# Patient Record
Sex: Female | Born: 2000 | Race: Black or African American | Hispanic: No | Marital: Single | Smoking: Never smoker
Health system: Southern US, Community
[De-identification: ages and names within clinical notes are randomized; demographics above are authoritative.]

## PROBLEM LIST (undated history)

## (undated) DIAGNOSIS — F419 Anxiety disorder, unspecified: Secondary | ICD-10-CM

## (undated) DIAGNOSIS — R569 Unspecified convulsions: Secondary | ICD-10-CM

## (undated) DIAGNOSIS — K219 Gastro-esophageal reflux disease without esophagitis: Secondary | ICD-10-CM

---
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
## 2018-01-04 ENCOUNTER — Emergency Department (HOSPITAL_COMMUNITY)
Admission: EM | Admit: 2018-01-04 | Discharge: 2018-01-04 | Disposition: A | Discharge status: Home / Self Care | Payer: Medicaid Other | Attending: Emergency Medicine | Admitting: Emergency Medicine

## 2018-01-04 ENCOUNTER — Emergency Department (HOSPITAL_COMMUNITY): Payer: Medicaid Other

## 2018-01-04 ENCOUNTER — Encounter (HOSPITAL_COMMUNITY): Payer: Self-pay | Admitting: Emergency Medicine

## 2018-01-04 DIAGNOSIS — S39012A Strain of muscle, fascia and tendon of lower back, initial encounter: Secondary | ICD-10-CM | POA: Insufficient documentation

## 2018-01-04 DIAGNOSIS — Z79899 Other long term (current) drug therapy: Secondary | ICD-10-CM | POA: Insufficient documentation

## 2018-01-04 DIAGNOSIS — R569 Unspecified convulsions: Secondary | ICD-10-CM | POA: Insufficient documentation

## 2018-01-04 DIAGNOSIS — Z7722 Contact with and (suspected) exposure to environmental tobacco smoke (acute) (chronic): Secondary | ICD-10-CM | POA: Insufficient documentation

## 2018-01-04 DIAGNOSIS — M549 Dorsalgia, unspecified: Secondary | ICD-10-CM

## 2018-01-04 DIAGNOSIS — Y999 Unspecified external cause status: Secondary | ICD-10-CM | POA: Diagnosis not present

## 2018-01-04 DIAGNOSIS — S3992XA Unspecified injury of lower back, initial encounter: Secondary | ICD-10-CM | POA: Diagnosis present

## 2018-01-04 DIAGNOSIS — Y929 Unspecified place or not applicable: Secondary | ICD-10-CM | POA: Diagnosis not present

## 2018-01-04 DIAGNOSIS — Y939 Activity, unspecified: Secondary | ICD-10-CM | POA: Insufficient documentation

## 2018-01-04 DIAGNOSIS — X509XXA Other and unspecified overexertion or strenuous movements or postures, initial encounter: Secondary | ICD-10-CM | POA: Insufficient documentation

## 2018-01-04 LAB — I-STAT CHEM 8, ED
BUN: 8 mg/dL (ref 6–20)
CALCIUM ION: 1.21 mmol/L (ref 1.15–1.40)
Chloride: 104 mmol/L (ref 101–111)
Creatinine, Ser: 0.6 mg/dL (ref 0.44–1.00)
Glucose, Bld: 93 mg/dL (ref 65–99)
HCT: 42 % (ref 36.0–46.0)
Hemoglobin: 14.3 g/dL (ref 12.0–15.0)
Potassium: 3.4 mmol/L — ABNORMAL LOW (ref 3.5–5.1)
SODIUM: 140 mmol/L (ref 135–145)
TCO2: 24 mmol/L (ref 22–32)

## 2018-01-04 LAB — I-STAT BETA HCG BLOOD, ED (MC, WL, AP ONLY)

## 2018-01-04 MED ORDER — SODIUM CHLORIDE 0.9 % IV SOLN
500.0000 mg | Freq: Once | INTRAVENOUS | Status: AC
  Administered 2018-01-04: 500 mg via INTRAVENOUS
  Filled 2018-01-04: qty 5

## 2018-01-04 MED ORDER — ACETAMINOPHEN 325 MG PO TABS
650.0000 mg | ORAL_TABLET | Freq: Once | ORAL | Status: AC
  Administered 2018-01-04: 650 mg via ORAL
  Filled 2018-01-04: qty 2

## 2018-01-04 MED ORDER — IBUPROFEN 200 MG PO TABS
400.0000 mg | ORAL_TABLET | Freq: Once | ORAL | Status: AC
  Administered 2018-01-04: 400 mg via ORAL
  Filled 2018-01-04: qty 2

## 2018-01-04 NOTE — ED Notes (Signed)
Transported to radiology for x ray

## 2018-01-04 NOTE — ED Notes (Signed)
Urine sample in specimen cup at the bedside.

## 2018-01-04 NOTE — ED Triage Notes (Signed)
Per [facility] pt from school after having witnessed seizure. Bystanders reports upper body tremors lasting about 3 minutes. Reports patient fell back onto ground and c/o neck and lower back pain. Reports taking Lamictal for seizures. States appt with neuro this week due to increase in seizures lately.

## 2018-01-04 NOTE — ED Notes (Signed)
Bed: WA20 Expected date:  Expected time:  Means of arrival:  Comments: EMS-fall 

## 2018-01-04 NOTE — ED Notes (Signed)
Bed: WA07 Expected date:  Expected time:  Means of arrival:  Comments: EMS-SZ 

## 2018-01-04 NOTE — ED Provider Notes (Signed)
[facility] Note   CSN: [id number] Arrival date & time: 01/04/18  1105     History   Chief Complaint Chief Complaint  Patient presents with  . Seizures    HPI Melinda Calderon is a 17 y.o. female.  The history is provided by the patient and the EMS personnel.  Seizures   This is a chronic problem. The current episode started less than 1 hour ago. The problem has been resolved. There was 1 seizure. The most recent episode lasted 2 to 5 minutes. Associated symptoms include headaches and neck stiffness. Pertinent negatives include no sleepiness, no confusion, no cough, no nausea and no diarrhea. Characteristics include rhythmic jerking and loss of consciousness. The episode was witnessed. There was the sensation of an aura present (states she wasn't feeling right). The seizures did not continue in the ED. The seizure(s) had no focality. Possible causes include sleep deprivation. Possible causes do not include medication or dosage change, missed seizure meds, recent illness or change in alcohol use. saw neuro in Nov and at that time meds increased due to frequent sz There has been no fever. Associated symptoms comments: Head, neck and lower back pain since seizure.  Per bystander report patient was sitting at her desk and fell backwards directly onto the floor during a seizure episode. There were no medications administered prior to arrival.    Past Medical History:  Diagnosis Date  . Anxiety   . Seizures [facility])     Patient Active Problem List   Diagnosis Date Noted  . 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 Panic disorder 10/23/2015  . Difficulty controlling anger 09/13/2015  . Dysphonia 09/12/2015  . Episodic tension-type headache, not intractable 02/15/2015  . Syncope and collapse 02/15/2015  . Migraine without aura 02/15/2015  . Lack of adequate sleep 02/15/2015  . Generalized convulsive  epilepsy (HCC) 02/22/2014  . Encounter for long-term (current) use of other medications 02/22/2014  . Seizure disorder (HCC) 12/27/2011  . Seizure (HCC) 12/27/2011  . Headache(784.0) 12/27/2011    History reviewed. No pertinent surgical history.  OB History    No data available       Home Medications    Prior to Admission medications   Medication Sig Start Date End Date Taking? Authorizing Provider  lamoTRIgine (LAMICTAL) 100 MG tablet Take 2 tablets by mouth twice per day 10/08/17   Elveria Rising, NP  levETIRAcetam (KEPPRA) 500 MG tablet Take 2 tablets twice per day 10/08/17   Elveria Rising, NP  PARoxetine (PAXIL) 30 MG tablet Take 1 tablet at bedtime 11/16/17   Elveria Rising, NP    Family History Family History  Problem Relation Age of Onset  . Hypertension Mother   . Cancer Maternal Grandmother        Died at 10  . Cancer Paternal Grandmother        Died at 2  . Epilepsy Maternal Grandfather   . Cirrhosis Maternal Grandfather        Died at 20  . Seizures Paternal Grandfather        Died at 75  . Epilepsy Paternal Aunt     Social History Social History   Tobacco Use  . Smoking status: Passive Smoke Exposure - Never Smoker  . Smokeless tobacco: Never Used  Substance Use Topics  . Alcohol use: No    Alcohol/week: 0.0 oz  . Drug use: No     Allergies   Patient  has no known allergies.   Review of Systems Review of Systems  Respiratory: Negative for cough.   Gastrointestinal: Negative for diarrhea and nausea.  Neurological: Positive for seizures, loss of consciousness and headaches.  Psychiatric/Behavioral: Negative for confusion.  All other systems reviewed and are negative.    Physical Exam Updated Vital Signs BP (!) 126/102 (BP Location: Left Arm)   Pulse 78   Temp 97.8 F (36.6 C) (Oral)   Resp 18   SpO2 100%   Physical Exam  Constitutional: She is oriented to person, place, and time. She appears well-developed and  well-nourished. No distress.  HENT:  Head: Normocephalic and atraumatic.  Mouth/Throat: Oropharynx is clear and moist.  Eyes: Conjunctivae and EOM are normal. Pupils are equal, round, and reactive to light.  Neck: Neck supple. Spinous process tenderness and muscular tenderness present. Decreased range of motion present.  c-collar in place  Cardiovascular: Normal rate, regular rhythm and intact distal pulses.  No murmur heard. Pulmonary/Chest: Effort normal and breath sounds normal. No respiratory distress. She has no wheezes. She has no rales.  Abdominal: Soft. She exhibits no distension. There is no tenderness. There is no rebound and no guarding.  Musculoskeletal: She exhibits tenderness. She exhibits no edema.       Thoracic back: She exhibits tenderness, bony tenderness and spasm.       Back:  Bilateral parathoracic muscular tenderness  Neurological: She is alert and oriented to person, place, and time. She has normal strength. No cranial nerve deficit or sensory deficit. Coordination normal.  Skin: Skin is warm and dry. No rash noted. No erythema.  Psychiatric: She has a normal mood and affect. Her behavior is normal.  Nursing note and vitals reviewed.    ED Treatments / Results  Labs (all labs ordered are listed, but only abnormal results are displayed) Labs Reviewed  I-STAT CHEM 8, ED - Abnormal; Notable for the following components:      Result Value   Potassium 3.4 (*)    All other components within normal limits  I-STAT BETA HCG BLOOD, ED ([facility], [facility], [facility] ONLY)    EKG  EKG Interpretation None       Radiology Dg Thoracic Spine W/swimmers  Result Date: 01/04/2018 CLINICAL DATA:  Back pain EXAM: THORACIC SPINE - 3 VIEWS COMPARISON:  08/28/2016 FINDINGS: There is no evidence of thoracic spine fracture. Alignment is normal. No other significant bone abnormalities are identified. IMPRESSION: Negative. Electronically Signed   By: Charlett Nose M.D.   On: 01/04/2018 13:02    Ct Head Wo Contrast  Result Date: 01/04/2018 CLINICAL DATA:  Seizure, fall.  Neck pain. EXAM: CT HEAD WITHOUT CONTRAST CT CERVICAL SPINE WITHOUT CONTRAST TECHNIQUE: Multidetector CT imaging of the head and cervical spine was performed following the standard protocol without intravenous contrast. Multiplanar CT image reconstructions of the cervical spine were also generated. COMPARISON:  07/07/2011 FINDINGS: CT HEAD FINDINGS Brain: No acute intracranial abnormality. Specifically, no hemorrhage, hydrocephalus, mass lesion, acute infarction, or significant intracranial injury. Vascular: No hyperdense vessel or unexpected calcification. Skull: No acute calvarial abnormality. Sinuses/Orbits: Visualized paranasal sinuses and mastoids clear. Orbital soft tissues unremarkable. Other: None CT CERVICAL SPINE FINDINGS Alignment: No subluxation.  Loss of cervical lordosis. Skull base and vertebrae: No fracture Soft tissues and spinal canal: Prevertebral soft tissues are normal. No epidural or paraspinal hematoma. Disc levels:  Maintains Upper chest: Negative Other: None IMPRESSION: No intracranial abnormality. Loss of cervical lordosis which may be positional or related to muscle spasm.  No bony abnormality. Electronically Signed   By: Charlett NoseKevin  Dover M.D.   On: 01/04/2018 12:17   Ct Cervical Spine Wo Contrast  Result Date: 01/04/2018 CLINICAL DATA:  Seizure, fall.  Neck pain. EXAM: CT HEAD WITHOUT CONTRAST CT CERVICAL SPINE WITHOUT CONTRAST TECHNIQUE: Multidetector CT imaging of the head and cervical spine was performed following the standard protocol without intravenous contrast. Multiplanar CT image reconstructions of the cervical spine were also generated. COMPARISON:  07/07/2011 FINDINGS: CT HEAD FINDINGS Brain: No acute intracranial abnormality. Specifically, no hemorrhage, hydrocephalus, mass lesion, acute infarction, or significant intracranial injury. Vascular: No hyperdense vessel or unexpected calcification.  Skull: No acute calvarial abnormality. Sinuses/Orbits: Visualized paranasal sinuses and mastoids clear. Orbital soft tissues unremarkable. Other: None CT CERVICAL SPINE FINDINGS Alignment: No subluxation.  Loss of cervical lordosis. Skull base and vertebrae: No fracture Soft tissues and spinal canal: Prevertebral soft tissues are normal. No epidural or paraspinal hematoma. Disc levels:  Maintains Upper chest: Negative Other: None IMPRESSION: No intracranial abnormality. Loss of cervical lordosis which may be positional or related to muscle spasm. No bony abnormality. Electronically Signed   By: Charlett NoseKevin  Dover M.D.   On: 01/04/2018 12:17    Procedures Procedures (including critical care time)  Medications Ordered in ED Medications  acetaminophen (TYLENOL) tablet 650 mg (not administered)  ibuprofen (ADVIL,MOTRIN) tablet 400 mg (not administered)  levETIRAcetam (KEPPRA) 500 mg in sodium chloride 0.9 % 100 mL IVPB (not administered)     Initial Impression / Assessment and Plan / ED Course  I have reviewed the triage vital signs and the nursing notes.  Pertinent labs & imaging results that were available during my care of the patient were reviewed by me and considered in my medical decision making (see chart for details).     Patient is a 17 year old female with a history of seizures on Lamictal and Keppra presenting today after a witnessed seizure while she was at class.  Patient has frequent seizures and states because of more frequent seizures she has an appointment to see [facility] later this week.  She has been taking her medications as prescribed.  She last saw neurology in November and at that time her Lamictal and Keppra were doubled.  She is now taking 500 mg of Keppra twice daily and 100 mg of Lamictal twice daily.  Patient denies any recent illness, concern for pregnancy, change in medications or alcohol use.  She states she chronically gets minimal sleep but that is not a new  symptom. Currently on exam she is neurovascularly intact.  However she is complaining of headache, neck pain and upper back pain.  She did fall out of a chair smacking her head on the floor.  We will do imaging to ensure no other acute injury.  Chem-8 and hCG pending.  Patient given IV Keppra bolus.  1:39 PM Labs are reassuring and imaging is negative.  C-spine was cleared.  Patient felt better after Tylenol.  Discussed findings with patient and her family.  She has an appointment later this week with neurology.  Told her to continue her current meds until seeing neurology where they will most likely be adjusted. Final Clinical Impressions(s) / ED Diagnoses   Final diagnoses:  Back pain  Seizures (HCC)  Back strain, initial encounter    ED Discharge Orders    None       Gwyneth SproutPlunkett, Delta Pichon, MD 01/04/18 1341

## 2018-01-13 ENCOUNTER — Encounter (INDEPENDENT_AMBULATORY_CARE_PROVIDER_SITE_OTHER): Payer: Self-pay | Admitting: Family

## 2018-01-13 ENCOUNTER — Ambulatory Visit (INDEPENDENT_AMBULATORY_CARE_PROVIDER_SITE_OTHER): Payer: Medicaid Other | Admitting: Family

## 2018-01-13 VITALS — BP 120/80 | HR 68 | Ht 60.75 in | Wt 143.8 lb

## 2018-01-13 DIAGNOSIS — F445 Conversion disorder with seizures or convulsions: Secondary | ICD-10-CM

## 2018-01-13 DIAGNOSIS — G40309 Generalized idiopathic epilepsy and epileptic syndromes, not intractable, without status epilepticus: Secondary | ICD-10-CM

## 2018-01-13 DIAGNOSIS — F410 Panic disorder [episodic paroxysmal anxiety] without agoraphobia: Secondary | ICD-10-CM

## 2018-01-13 DIAGNOSIS — Z72820 Sleep deprivation: Secondary | ICD-10-CM

## 2018-01-13 MED ORDER — FYCOMPA 2 MG PO TABS: ORAL_TABLET | ORAL | 0 refills | Status: DC

## 2018-01-13 NOTE — Patient Instructions (Signed)
Thank you for coming in today.   Instructions for you until your next appointment are as follows: 1. We will start a new medication called Fycompa. To take it, take 1 tablet at bedtime.  2. Call me in 2 weeks to let me know how you are doing. You can also send a text me at [phone] and I will call you back.  3. Continue your other medications as you have been taking them.  4. I am very happy that you are being seen at [facility]. Keep going there for help with panic.  5. I have written a letter for you to return to school.  6. Please return for follow up in 4 weeks.

## 2018-01-13 NOTE — Progress Notes (Signed)
Patient: Melinda Calderon MRN: [id number] Sex: female DOB: 04/21/2001  Provider: Elveria Rising, NP Location of Care: [facility]  Note type: Routine return visit  History of Present Illness: Referral Source: Ivory Broad, MD History from: mother, patient and [facility] chart Chief Complaint: Epilepsy/Panic disorder  Melinda Calderon is a 17 y.o. with history of generalized convulsive seizures, non-epileptic events, and panic disorder. She was last seen October 08, 2017. 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 Some last 1-3 minutes while some have lasted 15 min or more. She has also experienced episodes of loss of awareness that were non-epileptic in nature. Melinda Calderon had a 24 hr ambulatory EEG in February 2017 that was normal. She is taking and tolerating Lamotrigine and Levetiracetam for her seizure disorder and says that she has not missed any doses. She and her mother report today that she continues to have frequent seizures. Some occur during the day, such as last week at school when she had a seizure, fell backwards and struck her head. Mom says that Melinda Calderon also has frequent nocturnal seizures that typically occur within 1 hour of going to sleep but can also occur later in the night.   Melinda Calderon has a significant panic disorder but says that has improved considerably. She is being seen by a therapist and is also taking Paxil. Mom said that she has an appointment with a psychiatrist next month. Melinda Calderon says that she has trouble falling asleep and then is awakened by her family caring for her when she has a seizure. She reports frequent nights with disrupted or poor sleep.  Melinda Calderon says that school was going well until her seizure last week. She said that she has not been allowed to return to school until she was cleared from this office.   Melinda Calderon has been otherwise generally healthy since she was last seen. Neither she  nor her mother have other health concerns for her today other than previously mentioned.  Review of Systems: Please see the HPI for neurologic and other pertinent review of systems. Otherwise, all other systems were reviewed and were negative.   Past Medical History:  Diagnosis Date  . Anxiety   . Seizures (HCC)    Hospitalizations: No., Head Injury: No., Nervous System Infections: No., Immunizations up to date: Yes.   Past Medical History Comments: There have been problems with multiple missed appointments, difficulties obtaining clear history and concerns about possible non-epileptic events.  Surgical History History reviewed. No pertinent surgical history.  Family History family history includes Cancer in her maternal grandmother and paternal grandmother; Cirrhosis in her maternal grandfather; Epilepsy in her maternal grandfather and paternal aunt; Hypertension in her mother; Seizures in her paternal grandfather. Family History is otherwise negative for migraines, seizures, cognitive impairment, blindness, deafness, birth defects, chromosomal disorder, autism.  Social History Social History   Socioeconomic History  . Marital status: Single    Spouse name: None  . Number of children: None  . Years of education: None  . Highest education level: None  Social Needs  . Financial resource strain: None  . Food insecurity - worry: None  . Food insecurity - inability: None  . Transportation needs - medical: None  . Transportation needs - non-medical: None  Occupational History  . None  Tobacco Use  . Smoking status: Passive Smoke Exposure - Never Smoker  . Smokeless tobacco: Never Used  Substance and Sexual Activity  . Alcohol use: No    Alcohol/week: 0.0 oz  .  Drug use: No  . Sexual activity: No  Other Topics Concern  . 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   She attends [employer].    She lives with her parents and sibling.    She enjoys  watching TV and being on her phone.    Allergies No Known Allergies  Physical Exam BP 120/80   Pulse 68   Ht 5' 0.75" (1.543 m)   Wt 143 lb 12.8 oz (65.2 kg)   LMP 12/30/2017   BMI 27.39 kg/m  General: Well developed, well nourished adolescent female, seated on exam table, in no evident distress, dyed red hair, brown eyes, right handed Head: Head normocephalic and atraumatic.  Oropharynx benign. Neck: Supple with no carotid bruits Cardiovascular: Regular rate and rhythm, no murmurs Respiratory: Breath sounds clear to auscultation Musculoskeletal: No obvious deformities or scoliosis Skin: No rashes or neurocutaneous lesions  Neurologic Exam Mental Status: Awake and fully alert.  Oriented to place and time.  Recent and remote memory intact.  Attention span, concentration, and fund of knowledge appropriate.  Mood and affect appropriate. Cranial Nerves: Fundoscopic exam reveals sharp disc margins.  Pupils equal, briskly reactive to light.  Extraocular movements full without nystagmus.  Visual fields full to confrontation.  Hearing intact and symmetric to finger rub.  Facial sensation intact.  Face tongue, palate move normally and symmetrically.  Neck flexion and extension normal. Motor: Normal bulk and tone. Normal strength in all tested extremity muscles. Sensory: Intact to touch and temperature in all extremities.  Coordination: Rapid alternating movements normal in all extremities.  Finger-to-nose and heel-to shin performed accurately bilaterally.  Romberg negative. Gait and Station: Arises from chair without difficulty.  Stance is normal. Gait demonstrates normal stride length and balance.   Able to heel, toe and tandem walk without difficulty. Reflexes: 1+ and symmetric. Toes downgoing.  Impression 1.  Generalized convulsive epilepsy 2.  Non-epileptic events 3.  Panic disorder 4. Sleep deprivation 5. Problems with medical non-compliance 6. [facility]  Recommendations for plan of care [facility] Faiza has neither had nor required imaging or lab studies since the last visit. She is a 17 year old girl with history of generalized convulsive seizures, non-epileptic events, panic disorder and sleep deprivation. She is taking and tolerating Lamotrigine and Levetiracetam and says that she has been compliant. Unfortunately she continues to have breakthrough convulsive seizures. I consulted with Dr Sharene SkeansHickling and we will start Melinda Calderon on Melinda Calderon 2mg  with a slow upward titration.I reviewed the medication with Melinda Calderon and her mother, including potential side effects. I asked her to call me in 2 weeks to let me know how she is doing. We will gradually increase the medication based on side effects and efficacy. It is my hope that her sleep will improve when her seizures improve.  I also gave her a note for her to return to school.   In addition, I commended Melinda Calderon for working on her panic disorder with behavioral health. She is doing very well in terms of panic at this time.   I asked Zarai to return for follow up in 4 weeks or sooner if needed. She and her mother agreed with the plans ,made today.  The medication list was reviewed and reconciled.  I reviewed changes that were made in the prescribed medications today.  A complete medication list was provided to the patient.  Allergies as of 01/13/2018   No Known Allergies     Medication List        Accurate as of 01/13/18 11:59 PM. Always use your most recent med list.          Melinda Calderon 2 MG Tabs Generic drug:  Perampanel Take 1 tablet at bedtime   lamoTRIgine 100 MG tablet Commonly known as:  LAMICTAL Take 2 tablets by mouth twice per day   levETIRAcetam 500 MG tablet Commonly known as:  KEPPRA Take 2 tablets twice per day   PARoxetine 30 MG tablet Commonly known as:  PAXIL Take 1 tablet at bedtime        Dr. Sharene Skeans was consulted regarding the patient.   Total time spent with the patient was 30 minutes, of which 50% or more was spent in counseling and coordination of care.   Elveria Rising NP-C

## 2018-01-15 ENCOUNTER — Encounter (INDEPENDENT_AMBULATORY_CARE_PROVIDER_SITE_OTHER): Payer: Self-pay | Admitting: Family

## 2018-02-10 ENCOUNTER — Ambulatory Visit (INDEPENDENT_AMBULATORY_CARE_PROVIDER_SITE_OTHER): Payer: Medicaid Other | Admitting: Family

## 2018-02-15 ENCOUNTER — Ambulatory Visit (INDEPENDENT_AMBULATORY_CARE_PROVIDER_SITE_OTHER): Payer: Medicaid Other | Admitting: Family

## 2018-02-21 ENCOUNTER — Other Ambulatory Visit (INDEPENDENT_AMBULATORY_CARE_PROVIDER_SITE_OTHER): Payer: Self-pay | Admitting: Family

## 2018-02-21 DIAGNOSIS — G40309 Generalized idiopathic epilepsy and epileptic syndromes, not intractable, without status epilepticus: Secondary | ICD-10-CM

## 2018-03-04 ENCOUNTER — Encounter (HOSPITAL_COMMUNITY): Payer: Self-pay

## 2018-03-04 ENCOUNTER — Other Ambulatory Visit: Payer: Self-pay

## 2018-03-04 ENCOUNTER — Emergency Department (HOSPITAL_COMMUNITY)
Admission: EM | Admit: 2018-03-04 | Discharge: 2018-03-04 | Disposition: A | Discharge status: Home / Self Care | Payer: Medicaid Other | Attending: Emergency Medicine | Admitting: Emergency Medicine

## 2018-03-04 DIAGNOSIS — Z79899 Other long term (current) drug therapy: Secondary | ICD-10-CM | POA: Diagnosis not present

## 2018-03-04 DIAGNOSIS — R112 Nausea with vomiting, unspecified: Secondary | ICD-10-CM | POA: Diagnosis not present

## 2018-03-04 DIAGNOSIS — R1084 Generalized abdominal pain: Secondary | ICD-10-CM | POA: Insufficient documentation

## 2018-03-04 DIAGNOSIS — Z7722 Contact with and (suspected) exposure to environmental tobacco smoke (acute) (chronic): Secondary | ICD-10-CM | POA: Insufficient documentation

## 2018-03-04 DIAGNOSIS — R197 Diarrhea, unspecified: Secondary | ICD-10-CM

## 2018-03-04 LAB — URINALYSIS, ROUTINE W REFLEX MICROSCOPIC
Bacteria, UA: NONE SEEN
Bilirubin Urine: NEGATIVE
GLUCOSE, UA: NEGATIVE mg/dL
Hgb urine dipstick: NEGATIVE
KETONES UR: 5 mg/dL — AB
Leukocytes, UA: NEGATIVE
Nitrite: NEGATIVE
Protein, ur: 30 mg/dL — AB
Specific Gravity, Urine: 1.021 (ref 1.005–1.030)
pH: 7 (ref 5.0–8.0)

## 2018-03-04 LAB — I-STAT CG4 LACTIC ACID, ED
LACTIC ACID, VENOUS: 2.62 mmol/L — AB (ref 0.5–1.9)
LACTIC ACID, VENOUS: 1.47 mmol/L (ref 0.5–1.9)

## 2018-03-04 LAB — COMPREHENSIVE METABOLIC PANEL
ALT: 13 U/L — ABNORMAL LOW (ref 14–54)
AST: 18 U/L (ref 15–41)
Albumin: 4.4 g/dL (ref 3.5–5.0)
Alkaline Phosphatase: 67 U/L (ref 38–126)
Anion gap: 9 (ref 5–15)
BUN: 10 mg/dL (ref 6–20)
CHLORIDE: 108 mmol/L (ref 101–111)
CO2: 22 mmol/L (ref 22–32)
Calcium: 9.3 mg/dL (ref 8.9–10.3)
Creatinine, Ser: 0.64 mg/dL (ref 0.44–1.00)
Glucose, Bld: 106 mg/dL — ABNORMAL HIGH (ref 65–99)
POTASSIUM: 3.7 mmol/L (ref 3.5–5.1)
Sodium: 139 mmol/L (ref 135–145)
TOTAL PROTEIN: 7.3 g/dL (ref 6.5–8.1)
Total Bilirubin: 0.7 mg/dL (ref 0.3–1.2)

## 2018-03-04 LAB — CBC WITH DIFFERENTIAL/PLATELET
BASOS ABS: 0 10*3/uL (ref 0.0–0.1)
Basophils Relative: 0 %
EOS ABS: 0.1 10*3/uL (ref 0.0–0.7)
Eosinophils Relative: 1 %
HCT: 39.7 % (ref 36.0–46.0)
HEMOGLOBIN: 13.5 g/dL (ref 12.0–15.0)
LYMPHS ABS: 3.2 10*3/uL (ref 0.7–4.0)
LYMPHS PCT: 43 %
MCH: 30 pg (ref 26.0–34.0)
MCHC: 34 g/dL (ref 30.0–36.0)
MCV: 88.2 fL (ref 78.0–100.0)
Monocytes Absolute: 0.5 10*3/uL (ref 0.1–1.0)
Monocytes Relative: 6 %
NEUTROS PCT: 50 %
Neutro Abs: 3.8 10*3/uL (ref 1.7–7.7)
Platelets: 289 10*3/uL (ref 150–400)
RBC: 4.5 MIL/uL (ref 3.87–5.11)
RDW: 13.5 % (ref 11.5–15.5)
WBC: 7.6 10*3/uL (ref 4.0–10.5)

## 2018-03-04 LAB — LIPASE, BLOOD: Lipase: 23 U/L (ref 11–51)

## 2018-03-04 LAB — I-STAT BETA HCG BLOOD, ED (MC, WL, AP ONLY): I-stat hCG, quantitative: <5 m[IU]/mL (ref <5)

## 2018-03-04 MED ORDER — DICYCLOMINE HCL 10 MG PO CAPS
10.0000 mg | ORAL_CAPSULE | Freq: Once | ORAL | Status: AC
  Administered 2018-03-04: 10 mg via ORAL
  Filled 2018-03-04: qty 1

## 2018-03-04 MED ORDER — SODIUM CHLORIDE 0.9 % IV BOLUS
1000.0000 mL | Freq: Once | INTRAVENOUS | Status: AC
Start: 2018-03-04 — End: 2018-03-04
  Administered 2018-03-04: 1000 mL via INTRAVENOUS

## 2018-03-04 MED ORDER — MORPHINE SULFATE (PF) 4 MG/ML IV SOLN
4.0000 mg | Freq: Once | INTRAVENOUS | Status: AC
  Administered 2018-03-04: 4 mg via INTRAVENOUS
  Filled 2018-03-04: qty 1

## 2018-03-04 MED ORDER — GI COCKTAIL ~~LOC~~
30.0000 mL | Freq: Once | ORAL | Status: AC
  Administered 2018-03-04: 30 mL via ORAL
  Filled 2018-03-04: qty 30

## 2018-03-04 MED ORDER — ONDANSETRON 4 MG PO TBDP: 4.0000 mg | ORAL_TABLET | Freq: Three times a day (TID) | ORAL | 0 refills | Status: DC | PRN

## 2018-03-04 MED ORDER — ONDANSETRON HCL 4 MG/2ML IJ SOLN
4.0000 mg | Freq: Once | INTRAMUSCULAR | Status: AC
  Administered 2018-03-04: 4 mg via INTRAVENOUS
  Filled 2018-03-04: qty 2

## 2018-03-04 NOTE — ED Notes (Addendum)
Pt was seen shaking and gurgling, with mother stating "she's having a seizure." Pt was turned to side, suction started.This lasted about 10 seconds. MD called to bedside. Pt then turned to side and started crying that she was having abdominal pain and crying. Pt is A/Ox4 at this time.

## 2018-03-04 NOTE — ED Notes (Signed)
Bed: WA24 Expected date:  Expected time:  Means of arrival:  Comments: [facility] 

## 2018-03-04 NOTE — ED Triage Notes (Signed)
Pt c/o N/V/D starting last night. Pt also had a witnessed seizure for approx 3 min, per pt's mother around 1630. Hx of seizures. Pt and mother report compliant with seizure medications. Pt A/Ox4 at this time. Pt c/o sharp intermittent generalized abd pain  starting last night.

## 2018-03-04 NOTE — ED Provider Notes (Signed)
[facility]-EMERGENCY DEPT Provider Note   CSN: [id number] Arrival date & time: 03/04/18  1654     History   Chief Complaint Chief Complaint  Patient presents with  . Emesis  . Dizziness  . Seizures    HPI Melinda Calderon is a 17 y.o. female.  17yo F w/ PMH including seizure disorder, migraines, anxiety/panic attacks who p/w vomiting and abdominal pain.  Patient reports that she has been having intermittent abdominal pain recently but last night it got much worse and it has been persistent and severe today.  She describes the pain as generalized and constant, worse when she tries to eat or drink anything.  She has had associated nausea and vomiting.  She took her medications today but states that she was not able to keep them down.  She reports some mild diarrhea today.  No sick contacts.  Mild cough but no cold symptoms.  No urinary symptoms, vaginal bleeding/discharge, alcohol use, or drug use.  The history is provided by the patient and a parent.  Emesis    Dizziness  Associated symptoms: vomiting   Seizures   Associated symptoms include vomiting.    Past Medical History:  Diagnosis Date  . Anxiety   . Seizures [facility](HCC)     Patient Active Problem List   Diagnosis Date Noted  . 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 Panic disorder 10/23/2015  . Difficulty controlling anger 09/13/2015  . Dysphonia 09/12/2015  . Episodic tension-type headache, not intractable 02/15/2015  . Syncope and collapse 02/15/2015  . Migraine without aura 02/15/2015  . Lack of adequate sleep 02/15/2015  . 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 Encounter for long-term (current) use of other medications 02/22/2014  . Seizure disorder (HCC) 12/27/2011  . Seizure (HCC) 12/27/2011  . Headache(784.0) 12/27/2011    History reviewed. No pertinent surgical history.   OB History   None      Home Medications     Prior to Admission medications   Medication Sig Start Date End Date Taking? Authorizing Provider  cetirizine (ZYRTEC) 10 MG tablet Take 10 mg by mouth daily as needed. 01/26/18  Yes [provider]  fluticasone (FLONASE) 50 MCG/ACT nasal spray USE 2 SPRAYS IN EACH NOSTRIL IN THE EVENING 01/26/18  Yes [provider]  ibuprofen (ADVIL,MOTRIN) 200 MG tablet Take 200 mg by mouth daily as needed.   Yes [provider]  lamoTRIgine (LAMICTAL) 100 MG tablet Take 2 tablets by mouth twice per day 10/08/17  Yes Elveria RisingGoodpasture, Tina, NP  levETIRAcetam (KEPPRA) 500 MG tablet Take 2 tablets twice per day 10/08/17  Yes Elveria RisingGoodpasture, Tina, NP  PARoxetine (PAXIL) 30 MG tablet Take 1 tablet at bedtime 11/16/17  Yes Elveria RisingGoodpasture, Tina, NP  Pediatric Multiple Vit-C-FA (PEDIATRIC MULTIVITAMIN) chewable tablet Chew 1 tablet by mouth daily.   Yes [provider]  Perampanel (FYCOMPA) 2 MG TABS TAKE 1 TABLET BY MOUTH EVERYDAY AT BEDTIME 02/22/18  Yes Hickling, Deanna ArtisWilliam H, MD  ondansetron (ZOFRAN ODT) 4 MG disintegrating tablet Take 1 tablet (4 mg total) by mouth every 8 (eight) hours as needed for nausea or vomiting. 03/04/18   Valerye Kobus, Ambrose Finlandachel Morgan, MD    Family History Family History  Problem Relation Age of Onset  . Hypertension Mother   . Cancer Maternal Grandmother        Died at [phone]  . Cancer Paternal Grandmother        Died at 4261  .  Epilepsy Maternal Grandfather   . Cirrhosis Maternal Grandfather        Died at 38  . Seizures Paternal Grandfather        Died at 44  . Epilepsy Paternal Aunt     Social History Social History   Tobacco Use  . Smoking status: Passive Smoke Exposure - Never Smoker  . Smokeless tobacco: Never Used  Substance Use Topics  . Alcohol use: No    Alcohol/week: 0.0 oz  . Drug use: No     Allergies   Patient has no known allergies.   Review of Systems Review of Systems  Gastrointestinal: Positive for vomiting.  Neurological: Positive for  dizziness and seizures.   All other systems reviewed and are negative except that which was mentioned in HPI   Physical Exam Updated Vital Signs BP 112/64 (BP Location: Left Arm)   Pulse 64   Resp 15   Ht 5' (1.524 m)   Wt 65.2 kg (143 lb 12.8 oz)   LMP 02/26/2018   SpO2 100%   BMI 28.08 kg/m   Physical Exam  Constitutional: She is oriented to person, place, and time. She appears well-developed and well-nourished. She appears distressed.  Tearful, shaking, hyperventilating, drooling  HENT:  Head: Normocephalic and atraumatic.  Moist mucous membranes  Eyes: Pupils are equal, round, and reactive to light. Conjunctivae are normal.  Neck: Neck supple.  Cardiovascular: Normal rate, regular rhythm and normal heart sounds.  No murmur heard. Pulmonary/Chest: Effort normal and breath sounds normal.  Abdominal: Soft. Bowel sounds are normal. She exhibits no distension. There is tenderness (generalized). There is guarding (voluntary). There is no rebound.  Musculoskeletal: She exhibits no edema.  Neurological: She is alert and oriented to person, place, and time.  Fluent speech  Skin: Skin is warm and dry.  Psychiatric:  Anxious, tearful  Nursing note and vitals reviewed.    ED Treatments / Results  Labs (all labs ordered are listed, but only abnormal results are displayed) Labs Reviewed  COMPREHENSIVE METABOLIC PANEL - Abnormal; Notable for the following components:      Result Value   Glucose, Bld 106 (*)    ALT 13 (*)    All other components within normal limits  URINALYSIS, ROUTINE W REFLEX MICROSCOPIC - Abnormal; Notable for the following components:   APPearance HAZY (*)    Ketones, ur 5 (*)    Protein, ur 30 (*)    Squamous Epithelial / LPF 6-30 (*)    All other components within normal limits  I-STAT CG4 LACTIC ACID, ED - Abnormal; Notable for the following components:   Lactic Acid, Venous 2.62 (*)    All other components within normal limits  CBC WITH  DIFFERENTIAL/PLATELET  LIPASE, BLOOD  I-STAT BETA HCG BLOOD, ED ([facility], [facility], [facility] ONLY)  I-STAT CG4 LACTIC ACID, ED    EKG None  Radiology No results found.  Procedures Procedures (including critical care time)  Medications Ordered in ED Medications  morphine 4 MG/ML injection 4 mg (4 mg Intravenous Given 03/04/18 1728)  ondansetron (ZOFRAN) injection 4 mg (4 mg Intravenous Given 03/04/18 1726)  sodium chloride 0.9 % bolus 1,000 mL (0 mLs Intravenous Stopped 03/04/18 1945)  dicyclomine (BENTYL) capsule 10 mg (10 mg Oral Given 03/04/18 1858)  gi cocktail (Maalox,Lidocaine,Donnatal) (30 mLs Oral Given 03/04/18 1858)     Initial Impression / Assessment and Plan / ED Course  I have reviewed the triage vital signs and the nursing notes.  Pertinent labs &  imaging results that were available during my care of the patient were reviewed by me and considered in my medical decision making (see chart for details).     PT was in distress, crying and holding abdomen, shaking and mom was initially concerned about seizure.  When I arrived to the room, patient was shaking but also crying and her episode appeared non-epileptic in nature.  She initially had diffuse abdominal tenderness, could not localize the pain.  Gave above medications as well as IV fluids.  Initial lab work showed normal CMP, CBC, and lipase.  She had a very mildly elevated lactate which may be due to dehydration given all of her vomiting.  On reassessment after the above medicines, she was sitting up in bed, well-appearing, smiling, and stating that she was much improved.  She was also able to drink fluids with no further episodes of vomiting in the ED.  Urine without evidence of infection.  Given improvement in her abdominal pain and the presence of vomiting as well as diarrhea, I feel she is safe for discharge without abdominal imaging at this time.  I have extensively reviewed return precautions with her and mom and discussed supportive  measures.  They voiced understanding.  Final Clinical Impressions(s) / ED Diagnoses   Final diagnoses:  Nausea vomiting and diarrhea  Generalized abdominal pain    ED Discharge Orders        Ordered    ondansetron (ZOFRAN ODT) 4 MG disintegrating tablet  Every 8 hours PRN     03/04/18 2028       Mertie Haslem, Ambrose Finland, MD 03/04/18 [phone]

## 2018-04-26 ENCOUNTER — Emergency Department (HOSPITAL_COMMUNITY): Payer: Medicaid Other

## 2018-04-26 ENCOUNTER — Emergency Department (HOSPITAL_COMMUNITY)
Admission: EM | Admit: 2018-04-26 | Discharge: 2018-04-26 | Disposition: A | Discharge status: Home / Self Care | Payer: Medicaid Other | Attending: Emergency Medicine | Admitting: Emergency Medicine

## 2018-04-26 ENCOUNTER — Other Ambulatory Visit: Payer: Self-pay

## 2018-04-26 ENCOUNTER — Encounter (HOSPITAL_COMMUNITY): Payer: Self-pay | Admitting: *Deleted

## 2018-04-26 DIAGNOSIS — R1010 Upper abdominal pain, unspecified: Secondary | ICD-10-CM

## 2018-04-26 DIAGNOSIS — Z7722 Contact with and (suspected) exposure to environmental tobacco smoke (acute) (chronic): Secondary | ICD-10-CM | POA: Insufficient documentation

## 2018-04-26 DIAGNOSIS — G40919 Epilepsy, unspecified, intractable, without status epilepticus: Secondary | ICD-10-CM | POA: Diagnosis not present

## 2018-04-26 DIAGNOSIS — G43A0 Cyclical vomiting, not intractable: Secondary | ICD-10-CM | POA: Diagnosis not present

## 2018-04-26 DIAGNOSIS — Z79899 Other long term (current) drug therapy: Secondary | ICD-10-CM | POA: Insufficient documentation

## 2018-04-26 DIAGNOSIS — R1115 Cyclical vomiting syndrome unrelated to migraine: Secondary | ICD-10-CM

## 2018-04-26 LAB — URINALYSIS, ROUTINE W REFLEX MICROSCOPIC
BACTERIA UA: NONE SEEN
Bilirubin Urine: NEGATIVE
Glucose, UA: NEGATIVE mg/dL
Ketones, ur: 20 mg/dL — AB
Leukocytes, UA: NEGATIVE
Nitrite: NEGATIVE
PROTEIN: NEGATIVE mg/dL
SPECIFIC GRAVITY, URINE: 1.021 (ref 1.005–1.030)
pH: 6 (ref 5.0–8.0)

## 2018-04-26 LAB — COMPREHENSIVE METABOLIC PANEL
ALBUMIN: 4.5 g/dL (ref 3.5–5.0)
ALT: 13 U/L — AB (ref 14–54)
AST: 15 U/L (ref 15–41)
Alkaline Phosphatase: 72 U/L (ref 38–126)
Anion gap: 10 (ref 5–15)
BUN: 14 mg/dL (ref 6–20)
CO2: 22 mmol/L (ref 22–32)
CREATININE: 0.73 mg/dL (ref 0.44–1.00)
Calcium: 9.4 mg/dL (ref 8.9–10.3)
Chloride: 107 mmol/L (ref 101–111)
GFR calc Af Amer: >60 mL/min (ref >60)
GLUCOSE: 97 mg/dL (ref 65–99)
Potassium: 3.5 mmol/L (ref 3.5–5.1)
SODIUM: 139 mmol/L (ref 135–145)
Total Bilirubin: 0.7 mg/dL (ref 0.3–1.2)
Total Protein: 7.4 g/dL (ref 6.5–8.1)

## 2018-04-26 LAB — I-STAT BETA HCG BLOOD, ED (MC, WL, AP ONLY): I-stat hCG, quantitative: <5 m[IU]/mL (ref <5)

## 2018-04-26 LAB — GROUP A STREP BY PCR: Group A Strep by PCR: NOT DETECTED

## 2018-04-26 LAB — CBC
HCT: 40.2 % (ref 36.0–46.0)
Hemoglobin: 13.8 g/dL (ref 12.0–15.0)
MCH: 30.7 pg (ref 26.0–34.0)
MCHC: 34.3 g/dL (ref 30.0–36.0)
MCV: 89.3 fL (ref 78.0–100.0)
PLATELETS: 284 10*3/uL (ref 150–400)
RBC: 4.5 MIL/uL (ref 3.87–5.11)
RDW: 13.1 % (ref 11.5–15.5)
WBC: 8.7 10*3/uL (ref 4.0–10.5)

## 2018-04-26 LAB — MONONUCLEOSIS SCREEN: Mono Screen: NEGATIVE

## 2018-04-26 LAB — LIPASE, BLOOD: LIPASE: 28 U/L (ref 11–51)

## 2018-04-26 MED ORDER — SODIUM CHLORIDE 0.9 % IV BOLUS
1000.0000 mL | Freq: Once | INTRAVENOUS | Status: AC
  Administered 2018-04-26: 1000 mL via INTRAVENOUS

## 2018-04-26 MED ORDER — LEVETIRACETAM IN NACL 1000 MG/100ML IV SOLN
1000.0000 mg | Freq: Once | INTRAVENOUS | Status: AC
  Administered 2018-04-26: 1000 mg via INTRAVENOUS
  Filled 2018-04-26: qty 100

## 2018-04-26 MED ORDER — IOPAMIDOL (ISOVUE-300) INJECTION 61%
100.0000 mL | Freq: Once | INTRAVENOUS | Status: AC | PRN
  Administered 2018-04-26: 100 mL via INTRAVENOUS

## 2018-04-26 MED ORDER — ONDANSETRON HCL 4 MG PO TABS: 4.0000 mg | ORAL_TABLET | Freq: Three times a day (TID) | ORAL | 0 refills | Status: DC | PRN

## 2018-04-26 MED ORDER — MORPHINE SULFATE (PF) 4 MG/ML IV SOLN
4.0000 mg | Freq: Once | INTRAVENOUS | Status: AC
  Administered 2018-04-26: 4 mg via INTRAVENOUS
  Filled 2018-04-26: qty 1

## 2018-04-26 MED ORDER — OMEPRAZOLE 20 MG PO CPDR: 20.0000 mg | DELAYED_RELEASE_CAPSULE | Freq: Every day | ORAL | 0 refills | Status: DC

## 2018-04-26 MED ORDER — ONDANSETRON HCL 4 MG/2ML IJ SOLN
4.0000 mg | Freq: Once | INTRAMUSCULAR | Status: AC
  Administered 2018-04-26: 4 mg via INTRAVENOUS
  Filled 2018-04-26: qty 2

## 2018-04-26 MED ORDER — ONDANSETRON 4 MG PO TBDP
4.0000 mg | ORAL_TABLET | Freq: Once | ORAL | Status: AC | PRN
  Administered 2018-04-26: 4 mg via ORAL
  Filled 2018-04-26: qty 1

## 2018-04-26 NOTE — ED Triage Notes (Addendum)
EMS reports pt had nausea and vomiting x 1 week. History of seizures and can not keep her Keppra in due to vomiting. Stated by family she has been having 7-8 seizures per day for several days, pt calles spitting in bag vomiting.[id number]/72-78-100 CBG 125 #20 Rt AC

## 2018-04-26 NOTE — ED Notes (Signed)
Bed: WA02 Expected date:  Expected time:  Means of arrival:  Comments: [place]

## 2018-04-26 NOTE — ED Notes (Signed)
Pt unable to provide urine sample at this time 

## 2018-04-26 NOTE — ED Provider Notes (Signed)
[facility]-EMERGENCY DEPT Provider Note   CSN: [id number] Arrival date & time: 04/26/18  1339     History   Chief Complaint Chief Complaint  Patient presents with  . Seizures    HPI Melinda Calderon is a 17 y.o. female with a history of seizures on Keppra and Lamictal who presents the emergency department today for breakthrough seizures, abdominal pain, nausea and vomiting.  Patient states that she has had 1 month history of episodic, epigastric abdominal pain with associated anorexia, nausea as well as nonbilious, nonbloody emesis.  She states that this is been worsening over the last 1 week and occurring daily.  She notes she has had decreased appetite is anytime that she eats she has the pain as well as several episodes of nausea and vomiting.  She has not taken anything for symptoms.  She states she has not been able to take her seizure medication for almost 1 month as result of this.  She reports she has had breakthrough seizures the last 7-8 days with several per day.  She denies any head trauma, focal weaknesses, headache, facial droop, visual changes, numbness/tingling/weakness of the extremities, bowel or bladder incontinence.  She reports that her seizures are generalized tonic-clonic.  She is followed by pediatric neurology, Elveria Rising, NP. She was last seen on 01/13/18.  Patient is hesitant to answer whether or not she has marijuana.  No chronic NSAID use.  No previous abdominal surgeries.  States that there is no way she is pregnant.  She is not concerned for STDs.  She denies any fever, chills, chest pain, shortness breath, cough, lower abdominal pain, flank pain, urinary symptoms, diarrhea.  Last bowel movement today normal.  No melena or hematochezia.  HPI  Past Medical History:  Diagnosis Date  . Anxiety   . Seizures [facility])     Patient Active Problem List   Diagnosis Date Noted  . 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 Probable non-epileptic  seizure events 07/03/2016  . Panic disorder 10/23/2015  . Difficulty controlling anger 09/13/2015  . Dysphonia 09/12/2015  . Episodic tension-type headache, not intractable 02/15/2015  . Syncope and collapse 02/15/2015  . Migraine without aura 02/15/2015  . Lack of adequate sleep 02/15/2015  . 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 Encounter for long-term (current) use of other medications 02/22/2014  . Seizure disorder (HCC) 12/27/2011  . Seizure (HCC) 12/27/2011  . Headache(784.0) 12/27/2011    History reviewed. No pertinent surgical history.   OB History   None      Home Medications    Prior to Admission medications   Medication Sig Start Date End Date Taking? Authorizing Provider  cetirizine (ZYRTEC) 10 MG tablet Take 10 mg by mouth daily as needed for allergies.  01/26/18  Yes [provider]  fluticasone (FLONASE) 50 MCG/ACT nasal spray USE 2 SPRAYS IN EACH NOSTRIL IN THE EVENING 01/26/18  Yes [provider]  lamoTRIgine (LAMICTAL) 100 MG tablet Take 2 tablets by mouth twice per day 10/08/17  Yes Elveria Rising, NP  levETIRAcetam (KEPPRA) 500 MG tablet Take 2 tablets twice per day 10/08/17  Yes Elveria Rising, NP  PARoxetine (PAXIL) 30 MG tablet Take 1 tablet at bedtime 11/16/17  Yes Goodpasture, Inetta Fermo, NP  Perampanel (FYCOMPA) 2 MG TABS TAKE 1 TABLET BY MOUTH EVERYDAY AT BEDTIME 02/22/18  Yes Hickling, Deanna Artis, MD  ibuprofen (ADVIL,MOTRIN) 200 MG tablet Take 200 mg by mouth daily as needed for moderate pain.     [provider]  ondansetron (ZOFRAN ODT) 4 MG disintegrating tablet Take 1 tablet (4 mg total) by mouth every 8 (eight) hours as needed for nausea or vomiting. 03/04/18   Little, Ambrose Finland, MD    Family History Family History  Problem Relation Age of Onset  . Hypertension Mother   . Cancer Maternal Grandmother        Died at 104  . Cancer Paternal Grandmother        Died at 48  . Epilepsy Maternal Grandfather     . Cirrhosis Maternal Grandfather        Died at 13  . Seizures Paternal Grandfather        Died at 88  . Epilepsy Paternal Aunt     Social History Social History   Tobacco Use  . Smoking status: Passive Smoke Exposure - Never Smoker  . Smokeless tobacco: Never Used  Substance Use Topics  . Alcohol use: No    Alcohol/week: 0.0 oz  . Drug use: No     Allergies   Patient has no known allergies.   Review of Systems Review of Systems  All other systems reviewed and are negative.    Physical Exam Updated Vital Signs BP 106/65 (BP Location: Right Arm)   Pulse 72   Temp 98.4 F (36.9 C) (Oral)   Resp 15   Ht 5' (1.524 m)   Wt 63.5 kg (140 lb)   SpO2 100%   BMI 27.34 kg/m   Physical Exam  Constitutional: She appears well-developed and well-nourished.  HENT:  Head: Normocephalic and atraumatic.  Right Ear: External ear normal.  Left Ear: External ear normal.  Nose: Nose normal.  Mouth/Throat: Uvula is midline, oropharynx is clear and moist and mucous membranes are normal. No tonsillar exudate.  The patient has normal phonation and is in control of secretions. No stridor.  Midline uvula without edema. Soft palate rises symmetrically. 1+ Tonsillar erythema with exudates. No PTA. Tongue protrusion is normal. No trismus. No creptius on neck palpation and patient has good dentition. No gingival erythema or fluctuance noted. Mucus membranes moist.   Eyes: Pupils are equal, round, and reactive to light. Right eye exhibits no discharge. Left eye exhibits no discharge. No scleral icterus.  Neck: Trachea normal. Neck supple. No JVD present. No spinous process tenderness present. Carotid bruit is not present. No neck rigidity. Normal range of motion present.  No nuchal rigidity or meningismus.  No C-spine tenderness palpation or step-offs.  Cardiovascular: Normal rate, regular rhythm and intact distal pulses.  No murmur heard. Pulses:      Radial pulses are 2+ on the right  side, and 2+ on the left side.       Dorsalis pedis pulses are 2+ on the right side, and 2+ on the left side.       Posterior tibial pulses are 2+ on the right side, and 2+ on the left side.  No lower extremity swelling or edema. Calves symmetric in size bilaterally.  Pulmonary/Chest: Effort normal and breath sounds normal. She exhibits no tenderness.  Abdominal: Soft. Bowel sounds are normal. She exhibits no distension. There is tenderness in the right upper quadrant and epigastric area. There is no rigidity, no rebound, no guarding and no CVA tenderness.  Musculoskeletal: She exhibits no edema.  Lymphadenopathy:    She has no cervical adenopathy.  Neurological: She is alert.  Mental Status:  Alert, oriented, thought content appropriate, able to give a coherent history. Speech fluent without evidence  of aphasia. Able to follow 2 step commands without difficulty.  Cranial Nerves:  II:  Peripheral visual fields grossly normal, pupils equal, round, reactive to light III,IV, VI: ptosis not present, extra-ocular motions intact bilaterally  V,VII: smile symmetric, eyebrows raise symmetric, facial light touch sensation equal VIII: hearing grossly normal to voice  X: uvula elevates symmetrically  XI: bilateral shoulder shrug symmetric and strong XII: midline tongue extension without fassiculations Motor:  Normal tone. 5/5 in upper and lower extremities bilaterally including strong and equal grip strength and dorsiflexion/plantar flexion Sensory: Sensation intact to light touch in all extremities. Negative Romberg.  Deep Tendon Reflexes: 2+ and symmetric in the biceps and patella Cerebellar: normal finger-to-nose with bilateral upper extremities. Normal heel-to -shin balance bilaterally of the lower extremity. No pronator drift.  Gait: normal gait and balance CV: distal pulses palpable throughout   Skin: Skin is warm and dry. No rash noted. She is not diaphoretic.  Psychiatric: She has a normal  mood and affect.  Nursing note and vitals reviewed.    ED Treatments / Results  Labs (all labs ordered are listed, but only abnormal results are displayed) Labs Reviewed  COMPREHENSIVE METABOLIC PANEL - Abnormal; Notable for the following components:      Result Value   ALT 13 (*)    All other components within normal limits  URINALYSIS, ROUTINE W REFLEX MICROSCOPIC - Abnormal; Notable for the following components:   Hgb urine dipstick SMALL (*)    Ketones, ur 20 (*)    All other components within normal limits  GROUP A STREP BY PCR  LIPASE, BLOOD  CBC  MONONUCLEOSIS SCREEN  I-STAT BETA HCG BLOOD, ED ([facility], [facility], [facility] ONLY)    EKG None  Radiology Ct Abdomen Pelvis W Contrast  Result Date: 04/26/2018 CLINICAL DATA:  Nausea and vomiting EXAM: CT ABDOMEN AND PELVIS WITH CONTRAST TECHNIQUE: Multidetector CT imaging of the abdomen and pelvis was performed using the standard protocol following bolus administration of intravenous contrast. CONTRAST:  ISOVUE-300 IOPAMIDOL (ISOVUE-300) INJECTION 61% COMPARISON:  None FINDINGS: Lower chest: No acute abnormality. Hepatobiliary: No focal liver abnormality is seen. No gallstones, gallbladder wall thickening, or biliary dilatation. Pancreas: Unremarkable. No pancreatic ductal dilatation or surrounding inflammatory changes. Spleen: Normal in size without focal abnormality. Adrenals/Urinary Tract: Normal appearance of the adrenal glands. The kidneys are unremarkable. No hydronephrosis identified. Urinary bladder appears within normal limits. Stomach/Bowel: The stomach is normal. The small bowel loops have a normal course and caliber. Unremarkable appearance of the appendix. The colon is normal. Vascular/Lymphatic: No significant vascular findings are present. No enlarged abdominal or pelvic lymph nodes. Reproductive: The uterus appears normal. Thin walled cystic structure posterior to the uterus measures 3.8 cm and is favored to represent an ovarian  cyst Other: There is no free fluid or fluid collections. Musculoskeletal: No acute or significant osseous findings. IMPRESSION: 1. No definite acute findings identified within the abdomen or pelvis. 2. Thin walled cystic structure within the posterior pelvis is favored to represent an ovarian cyst. Electronically Signed   By: Signa Kell M.D.   On: 04/26/2018 23:00   US Abdomen Limited Ruq  Result Date: 04/26/2018 CLINICAL DATA:  Nausea and vomiting for 1 week. EXAM: ULTRASOUND ABDOMEN LIMITED RIGHT UPPER QUADRANT COMPARISON:  None. FINDINGS: Gallbladder: No gallstones or wall thickening visualized. No sonographic Murphy sign noted by sonographer. Common bile duct: Diameter: 2 mm Liver: No focal lesion identified. Within normal limits in parenchymal echogenicity. Portal vein is patent on color Doppler  imaging with normal direction of blood flow towards the liver. IMPRESSION: Normal exam. Electronically Signed   By: Signa Kell M.D.   On: 04/26/2018 20:28    Procedures Procedures (including critical care time)  Medications Ordered in ED Medications  ondansetron (ZOFRAN-ODT) disintegrating tablet 4 mg (4 mg Oral Given 04/26/18 1354)  sodium chloride 0.9 % bolus 1,000 mL (0 mLs Intravenous Stopped 04/26/18 2210)  ondansetron (ZOFRAN) injection 4 mg (4 mg Intravenous Given 04/26/18 1948)  levETIRAcetam (KEPPRA) IVPB 1000 mg/100 mL premix (0 mg Intravenous Stopped 04/26/18 2210)  morphine 4 MG/ML injection 4 mg (4 mg Intravenous Given 04/26/18 1950)  iopamidol (ISOVUE-300) 61 % injection 100 mL (100 mLs Intravenous Contrast Given 04/26/18 2232)     Initial Impression / Assessment and Plan / ED Course  I have reviewed the triage vital signs and the nursing notes.  Pertinent labs & imaging results that were available during my care of the patient were reviewed by me and considered in my medical decision making (see chart for details).     17 y.o. female with severe seizures currently on Keppra  and Lamictal who presents the emergent department today for breakthrough seizure as well as upper abdominal pain with nausea and vomiting.  She has been unable to take her seizure medications secondary to nausea and vomiting.  She does she has had breakthrough seizures on several occasions in the last several days.  No head trauma or loss of consciousness.  She is neurologically intact.  Do not feel she needs a CT scan of her head at this current time. Will load patient with Keppra.  Will work-up for abdominal pain, N/V with labs and images.  Will give IV fluids, nausea and pain medication.  Patient also found to have exudate on throat exam.  She is asymptomatic this current time.  Will order mono and strep test.  No evidence of PTA.   Pregnancy test negative.  Lipase within normal limits.  No significant electrolyte derangements.  No acute kidney injury.  LFTs without elevation.  No anion gap acidosis.  No evidence of UTI.  No leukocytosis.  No anemia.  Unremarkable right upper quadrant ultrasound and CT without definitive acute findings for patient's abdominal pain.  There is an ovarian cyst I discussed with the patient but do not think is the cause of the patient's symptoms.   No causation of the patient's upper abdominal pain, nausea and vomiting.  There is question of the patient is been using marijuana.  Possible this is caused from cannabinoid hyperemesis syndrome.  Patient is currently tolerating p.o. fluids.  Will discharge with short course of Zofran.  Will do trial of omeprazole for patient's symptoms.  Patient has follow-up appointment with her neurologist tomorrow.  Will have her resume her home medications and follow-up during scheduled appointment.  Will have patient follow-up with GI of her abdominal symptoms continue. I advised the patient to follow-up with PCP this week. Specific return precautions discussed. Time was given for all questions to be answered. The patient verbalized understanding  and agreement with plan. The patient appears safe for discharge home. Patient case discussed with Dr. Rush Landmark who is in agreement with plan.  Final Clinical Impressions(s) / ED Diagnoses   Final diagnoses:  Upper abdominal pain  Non-intractable cyclical vomiting with nausea  Breakthrough seizure [facility])    ED Discharge Orders        Ordered    omeprazole (PRILOSEC) 20 MG capsule  Daily  04/26/18 2325    ondansetron (ZOFRAN) 4 MG tablet  Every 8 hours PRN     04/26/18 2325       Princella Pellegrini 04/26/18 2326    Tegeler, Canary Brim, MD 04/27/18 [phone]

## 2018-04-26 NOTE — Discharge Instructions (Addendum)
Your work-up was reassuring. Please continue your home seizure medications Take Zofran as needed for nausea vomiting Please take trial of omeprazole by mouth daily over the next 1 month Follow-up with your neurologist tomorrow for scheduled appointment Follow-up with GI if you continue to have abdominal pain, nausea or vomiting Please follow with your PCP to discuss findings today. If you develop worsening or new concerning symptoms you can return to the emergency department for re-evaluation.

## 2018-04-27 ENCOUNTER — Other Ambulatory Visit (INDEPENDENT_AMBULATORY_CARE_PROVIDER_SITE_OTHER): Payer: Self-pay | Admitting: Family

## 2018-04-27 ENCOUNTER — Encounter (INDEPENDENT_AMBULATORY_CARE_PROVIDER_SITE_OTHER): Payer: Self-pay | Admitting: Family

## 2018-04-27 ENCOUNTER — Ambulatory Visit (INDEPENDENT_AMBULATORY_CARE_PROVIDER_SITE_OTHER): Payer: Medicaid Other | Admitting: Family

## 2018-04-27 DIAGNOSIS — F410 Panic disorder [episodic paroxysmal anxiety] without agoraphobia: Secondary | ICD-10-CM

## 2018-04-27 MED ORDER — PAROXETINE HCL 30 MG PO TABS: ORAL_TABLET | ORAL | 0 refills | Status: DC

## 2018-04-27 NOTE — Telephone Encounter (Signed)
°  Who's calling (name and relationship to patient) : Mom/Patricia  Best contact number: [phone]  Provider they see: Sula Soda  Reason for call: Mom called in requesting to r/s appt from today, rescheduled to 05/04/18. Mom requested refill for meds, pt is currently out of meds and was just discharged this AM from the hospital.     PRESCRIPTION REFILL ONLY  Name of prescription: PARoxetine (PAXIL) 30 MG tablet  Pharmacy: [facility] on [address]/[place]

## 2018-04-27 NOTE — Telephone Encounter (Signed)
Rx has been electronically sent to the pharmacy 

## 2018-04-27 NOTE — Addendum Note (Signed)
Addended by: Harland Dingwall A on: 04/27/2018 12:29 PM   Modules accepted: Orders

## 2018-05-04 ENCOUNTER — Ambulatory Visit (INDEPENDENT_AMBULATORY_CARE_PROVIDER_SITE_OTHER): Payer: Medicaid Other | Admitting: Family

## 2018-07-17 ENCOUNTER — Other Ambulatory Visit (INDEPENDENT_AMBULATORY_CARE_PROVIDER_SITE_OTHER): Payer: Self-pay | Admitting: Family

## 2018-07-17 DIAGNOSIS — F410 Panic disorder [episodic paroxysmal anxiety] without agoraphobia: Secondary | ICD-10-CM

## 2018-08-03 ENCOUNTER — Ambulatory Visit (INDEPENDENT_AMBULATORY_CARE_PROVIDER_SITE_OTHER): Payer: Medicaid Other | Admitting: Family

## 2018-08-04 ENCOUNTER — Encounter (INDEPENDENT_AMBULATORY_CARE_PROVIDER_SITE_OTHER): Payer: Self-pay | Admitting: Family

## 2018-09-03 ENCOUNTER — Emergency Department (HOSPITAL_COMMUNITY): Payer: Medicaid Other

## 2018-09-03 ENCOUNTER — Encounter (HOSPITAL_COMMUNITY): Payer: Self-pay | Admitting: Emergency Medicine

## 2018-09-03 ENCOUNTER — Emergency Department (HOSPITAL_COMMUNITY)
Admission: EM | Admit: 2018-09-03 | Discharge: 2018-09-03 | Disposition: A | Discharge status: Home / Self Care | Payer: Medicaid Other | Attending: Emergency Medicine | Admitting: Emergency Medicine

## 2018-09-03 ENCOUNTER — Other Ambulatory Visit: Payer: Self-pay

## 2018-09-03 DIAGNOSIS — Z7722 Contact with and (suspected) exposure to environmental tobacco smoke (acute) (chronic): Secondary | ICD-10-CM | POA: Insufficient documentation

## 2018-09-03 DIAGNOSIS — G40909 Epilepsy, unspecified, not intractable, without status epilepticus: Secondary | ICD-10-CM | POA: Insufficient documentation

## 2018-09-03 DIAGNOSIS — R1084 Generalized abdominal pain: Secondary | ICD-10-CM | POA: Insufficient documentation

## 2018-09-03 DIAGNOSIS — Z79899 Other long term (current) drug therapy: Secondary | ICD-10-CM | POA: Insufficient documentation

## 2018-09-03 DIAGNOSIS — R112 Nausea with vomiting, unspecified: Secondary | ICD-10-CM | POA: Diagnosis not present

## 2018-09-03 LAB — CBC WITH DIFFERENTIAL/PLATELET
BASOS ABS: 0 10*3/uL (ref 0.0–0.1)
BASOS PCT: 0 %
EOS PCT: 0 %
Eosinophils Absolute: 0.1 10*3/uL (ref 0.0–0.7)
HCT: 40.9 % (ref 36.0–46.0)
Hemoglobin: 14.1 g/dL (ref 12.0–15.0)
Lymphocytes Relative: 14 %
Lymphs Abs: 2.2 10*3/uL (ref 0.7–4.0)
MCH: 30.6 pg (ref 26.0–34.0)
MCHC: 34.5 g/dL (ref 30.0–36.0)
MCV: 88.7 fL (ref 78.0–100.0)
MONO ABS: 0.3 10*3/uL (ref 0.1–1.0)
MONOS PCT: 2 %
Neutro Abs: 13.4 10*3/uL — ABNORMAL HIGH (ref 1.7–7.7)
Neutrophils Relative %: 84 %
PLATELETS: 358 10*3/uL (ref 150–400)
RBC: 4.61 MIL/uL (ref 3.87–5.11)
RDW: 12.8 % (ref 11.5–15.5)
WBC: 16 10*3/uL — ABNORMAL HIGH (ref 4.0–10.5)

## 2018-09-03 LAB — URINALYSIS, ROUTINE W REFLEX MICROSCOPIC
BACTERIA UA: NONE SEEN
Bilirubin Urine: NEGATIVE
Glucose, UA: NEGATIVE mg/dL
Hgb urine dipstick: NEGATIVE
Ketones, ur: 5 mg/dL — AB
Nitrite: NEGATIVE
PH: 6 (ref 5.0–8.0)
Protein, ur: NEGATIVE mg/dL
SPECIFIC GRAVITY, URINE: 1.01 (ref 1.005–1.030)

## 2018-09-03 LAB — PREGNANCY, URINE: PREG TEST UR: NEGATIVE

## 2018-09-03 LAB — COMPREHENSIVE METABOLIC PANEL
ALBUMIN: 4.8 g/dL (ref 3.5–5.0)
ALT: 24 U/L (ref 0–44)
ANION GAP: 15 (ref 5–15)
AST: 27 U/L (ref 15–41)
Alkaline Phosphatase: 59 U/L (ref 38–126)
BUN: 11 mg/dL (ref 6–20)
CHLORIDE: 113 mmol/L — AB (ref 98–111)
CO2: 19 mmol/L — ABNORMAL LOW (ref 22–32)
Calcium: 9.7 mg/dL (ref 8.9–10.3)
Creatinine, Ser: 0.67 mg/dL (ref 0.44–1.00)
GFR calc Af Amer: >60 mL/min (ref >60)
GLUCOSE: 131 mg/dL — AB (ref 70–99)
POTASSIUM: 3.6 mmol/L (ref 3.5–5.1)
Sodium: 147 mmol/L — ABNORMAL HIGH (ref 135–145)
Total Bilirubin: 0.5 mg/dL (ref 0.3–1.2)
Total Protein: 7.8 g/dL (ref 6.5–8.1)

## 2018-09-03 LAB — LIPASE, BLOOD: LIPASE: 31 U/L (ref 11–51)

## 2018-09-03 MED ORDER — PROMETHAZINE HCL 25 MG/ML IJ SOLN
25.0000 mg | Freq: Once | INTRAMUSCULAR | Status: AC
  Administered 2018-09-03: 25 mg via INTRAVENOUS
  Filled 2018-09-03: qty 1

## 2018-09-03 MED ORDER — SODIUM CHLORIDE 0.9 % IV BOLUS
1000.0000 mL | Freq: Once | INTRAVENOUS | Status: AC
  Administered 2018-09-03: 1000 mL via INTRAVENOUS

## 2018-09-03 MED ORDER — IOPAMIDOL (ISOVUE-300) INJECTION 61%
INTRAVENOUS | Status: AC
  Filled 2018-09-03: qty 100

## 2018-09-03 MED ORDER — MORPHINE SULFATE (PF) 4 MG/ML IV SOLN
4.0000 mg | Freq: Once | INTRAVENOUS | Status: AC
  Administered 2018-09-03: 4 mg via INTRAVENOUS
  Filled 2018-09-03: qty 1

## 2018-09-03 MED ORDER — SODIUM CHLORIDE 0.9 % IJ SOLN
INTRAMUSCULAR | Status: AC
  Filled 2018-09-03: qty 50

## 2018-09-03 MED ORDER — ONDANSETRON HCL 4 MG/2ML IJ SOLN
4.0000 mg | Freq: Once | INTRAMUSCULAR | Status: AC
  Administered 2018-09-03: 4 mg via INTRAVENOUS
  Filled 2018-09-03: qty 2

## 2018-09-03 MED ORDER — IOPAMIDOL (ISOVUE-300) INJECTION 61%
100.0000 mL | Freq: Once | INTRAVENOUS | Status: AC | PRN
  Administered 2018-09-03: 100 mL via INTRAVENOUS

## 2018-09-03 MED ORDER — PROMETHAZINE HCL 25 MG PO TABS: 25.0000 mg | ORAL_TABLET | Freq: Four times a day (QID) | ORAL | 0 refills | Status: DC | PRN

## 2018-09-03 MED ORDER — DICYCLOMINE HCL 20 MG PO TABS: 20.0000 mg | ORAL_TABLET | Freq: Two times a day (BID) | ORAL | 0 refills | Status: DC | PRN

## 2018-09-03 NOTE — ED Triage Notes (Signed)
Per [facility] pt complaint of n/v post "drinking a small cup" of alcohol last night.

## 2018-09-03 NOTE — Discharge Instructions (Signed)
Follow up with your primary care doctor to discuss your hospital visit. Continue to hydrate orally with small sips of fluids throughout the day. Use Phenergan as directed for nausea & vomiting.   The 'BRAT' diet is suggested, then progress to diet as tolerated as symptoms abate.  Bananas.  Rice.  Applesauce.  Toast (and other simple starches such as crackers, potatoes, noodles).   SEEK IMMEDIATE MEDICAL ATTENTION IF: A temperature above 101 develops Repeated vomiting occurs (multiple uncontrollable episodes) or you are unable to keep fluids down Blood is being passed in stools or vomit (bright red or black tarry stools).  New or worsening symptoms develop, any additional concerns.

## 2018-09-03 NOTE — ED Notes (Signed)
Bed: ZO10 Expected date:  Expected time:  Means of arrival:  Comments: EMS 17 yo female N/V.

## 2018-09-03 NOTE — ED Provider Notes (Signed)
[facility]-EMERGENCY DEPT Provider Note   CSN: [id number] Arrival date & time: 09/03/18  4098     History   Chief Complaint Chief Complaint  Patient presents with  . Emesis    HPI Melinda Calderon is a 17 y.o. female.  The history is provided by the patient, medical records and a parent. No language interpreter was used.  Emesis   Associated symptoms include abdominal pain. Pertinent negatives include no diarrhea.   KEEANNA Calderon is a 17 y.o. female  with a PMH of seizure disorder, anxiety who presents to the Emergency Department complaining of persistent nausea, vomiting which began last night. No history of similar. She states that she had one alcoholic beverage last night (OJ and tequila). Shortly after, she started experiencing symptoms.  No diarrhea or blood in stool.  No fever or chills.  No dysuria, urinary urgency/frequency or vaginal discharge.  No back pain.  No known sick contacts or recent travel.  Denies drug use.  Past Medical History:  Diagnosis Date  . Anxiety   . Seizures [facility])     Patient Active Problem List   Diagnosis Date Noted  . 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 Panic disorder 10/23/2015  . Difficulty controlling anger 09/13/2015  . Dysphonia 09/12/2015  . Episodic tension-type headache, not intractable 02/15/2015  . Syncope and collapse 02/15/2015  . Migraine without aura 02/15/2015  . Lack of adequate sleep 02/15/2015  . 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 Encounter for long-term (current) use of other medications 02/22/2014  . Seizure disorder (HCC) 12/27/2011  . Seizure (HCC) 12/27/2011  . Headache(784.0) 12/27/2011    History reviewed. No pertinent surgical history.   OB History   None      Home Medications    Prior to Admission medications   Medication Sig Start Date End Date Taking? Authorizing Provider  ibuprofen (ADVIL,MOTRIN) 200  MG tablet Take 400 mg by mouth daily as needed for moderate pain.    Yes [provider]  lamoTRIgine (LAMICTAL) 100 MG tablet Take 2 tablets by mouth twice per day Patient taking differently: Take 200 mg by mouth 2 (two) times daily. Take 2 tablets by mouth twice per day 10/08/17  Yes Goodpasture, Inetta Fermo, NP  levETIRAcetam (KEPPRA) 500 MG tablet Take 2 tablets twice per day Patient taking differently: Take 1,000 mg by mouth 2 (two) times daily.  10/08/17  Yes Elveria Rising, NP  omeprazole (PRILOSEC) 20 MG capsule Take 1 capsule (20 mg total) by mouth daily. Patient taking differently: Take 20 mg by mouth daily as needed (heartburn).  04/26/18  Yes Maczis, Elmer Sow, PA-C  PARoxetine (PAXIL) 30 MG tablet Take 1 tablet at bedtime Patient taking differently: Take 30 mg by mouth at bedtime.  04/27/18  Yes Elveria Rising, NP  sertraline (ZOLOFT) 50 MG tablet Take 50 mg by mouth at bedtime.   Yes [provider]  cetirizine (ZYRTEC) 10 MG tablet Take 10 mg by mouth daily as needed for allergies.  01/26/18   [provider]  dicyclomine (BENTYL) 20 MG tablet Take 1 tablet (20 mg total) by mouth 2 (two) times daily as needed (Abdominal cramping). 09/03/18   Ward, Chase Picket, PA-C  fluticasone (FLONASE) 50 MCG/ACT nasal spray Place 2 sprays into both nostrils daily as needed for allergies or rhinitis.  01/26/18   [provider]  ondansetron (ZOFRAN ODT) 4 MG disintegrating tablet Take 1 tablet (4 mg total) by  mouth every 8 (eight) hours as needed for nausea or vomiting. Patient not taking: Reported on 09/03/2018 03/04/18   Little, Ambrose Finland, MD  ondansetron (ZOFRAN) 4 MG tablet Take 1 tablet (4 mg total) by mouth every 8 (eight) hours as needed for nausea or vomiting. Patient not taking: Reported on 09/03/2018 04/26/18   Maczis, Elmer Sow, PA-C  Perampanel [facility]) 2 MG TABS TAKE 1 TABLET BY MOUTH EVERYDAY AT BEDTIME Patient not taking: Reported on 09/03/2018 02/22/18    Deetta Perla, MD  promethazine (PHENERGAN) 25 MG tablet Take 1 tablet (25 mg total) by mouth every 6 (six) hours as needed for nausea. 09/03/18   Ward, Chase Picket, PA-C    Family History Family History  Problem Relation Age of Onset  . Hypertension Mother   . Cancer Maternal Grandmother        Died at 30  . Cancer Paternal Grandmother        Died at 29  . Epilepsy Maternal Grandfather   . Cirrhosis Maternal Grandfather        Died at 10  . Seizures Paternal Grandfather        Died at 59  . Epilepsy Paternal Aunt     Social History Social History   Tobacco Use  . Smoking status: Passive Smoke Exposure - Never Smoker  . Smokeless tobacco: Never Used  Substance Use Topics  . Alcohol use: No    Alcohol/week: 0.0 standard drinks  . Drug use: No     Allergies   Patient has no known allergies.   Review of Systems Review of Systems  Gastrointestinal: Positive for abdominal pain, nausea and vomiting. Negative for blood in stool, constipation and diarrhea.  All other systems reviewed and are negative.    Physical Exam Updated Vital Signs BP 117/66   Pulse 91   Temp 98.8 F (37.1 C) (Oral)   Resp 16   SpO2 100%   Physical Exam  Constitutional: She is oriented to person, place, and time. She appears well-developed and well-nourished. No distress.  HENT:  Head: Normocephalic and atraumatic.  Neck: Neck supple.  Cardiovascular: Normal rate, regular rhythm and normal heart sounds.  No murmur heard. Pulmonary/Chest: Effort normal and breath sounds normal. No respiratory distress.  Abdominal: Soft. Bowel sounds are normal. She exhibits no distension.  Generalized abdominal tenderness, however most significantly in the right lower quadrant.  No rebound tenderness.  No CVA tenderness.  Neurological: She is alert and oriented to person, place, and time.  Skin: Skin is warm and dry.  Nursing note and vitals reviewed.    ED Treatments / Results  Labs (all  labs ordered are listed, but only abnormal results are displayed) Labs Reviewed  CBC WITH DIFFERENTIAL/PLATELET - Abnormal; Notable for the following components:      Result Value   WBC 16.0 (*)    Neutro Abs 13.4 (*)    All other components within normal limits  COMPREHENSIVE METABOLIC PANEL - Abnormal; Notable for the following components:   Sodium 147 (*)    Chloride 113 (*)    CO2 19 (*)    Glucose, Bld 131 (*)    All other components within normal limits  URINALYSIS, ROUTINE W REFLEX MICROSCOPIC - Abnormal; Notable for the following components:   Ketones, ur 5 (*)    Leukocytes, UA TRACE (*)    All other components within normal limits  LIPASE, BLOOD  PREGNANCY, URINE    EKG None  Radiology Ct Abdomen Pelvis  W Contrast  Result Date: 09/03/2018 CLINICAL DATA:  Right lower quadrant pain. Nausea and vomiting. EXAM: CT ABDOMEN AND PELVIS WITH CONTRAST TECHNIQUE: Multidetector CT imaging of the abdomen and pelvis was performed using the standard protocol following bolus administration of intravenous contrast. CONTRAST:  ISOVUE-300 IOPAMIDOL (ISOVUE-300) INJECTION 61% COMPARISON:  04/26/2018 FINDINGS: Lower chest: The visualized lung bases are clear. Hepatobiliary: A small portion of the right hepatic dome was excluded from imaging. No focal liver abnormality is identified. The gallbladder is unremarkable. There is no biliary dilatation. Pancreas: Unremarkable. Spleen: Unremarkable. Adrenals/Urinary Tract: Unremarkable adrenal glands. No evidence of renal mass, calculi, or hydronephrosis. Largely decompressed bladder. Stomach/Bowel: The stomach is within normal limits. Fluid is present in multiple nondilated small bowel loops throughout the mid abdomen without evidence of bowel obstruction or bowel wall thickening. The appendix is likely identified posterior to the cecum and is collapsed without evidence of appendicitis. Vascular/Lymphatic: No significant vascular findings are  present. No enlarged abdominal or pelvic lymph nodes. Reproductive: Unremarkable uterus and left ovary. 1.5 cm peripherally enhancing structure in the right adnexa likely represents a corpus luteum. Thin walled cystic structure posterior to the uterus on the prior CT has resolved. Other: Small volume free fluid in the pelvis, potentially physiologic. No abdominal wall hernia. Musculoskeletal: No acute osseous abnormality or suspicious osseous lesion. IMPRESSION: 1. No acute abnormality identified in the abdomen or pelvis. 2. Right ovarian corpus luteum. Electronically Signed   By: Sebastian Ache M.D.   On: 09/03/2018 12:45    Procedures Procedures (including critical care time)  Medications Ordered in ED Medications  iopamidol (ISOVUE-300) 61 % injection (has no administration in time range)  sodium chloride 0.9 % injection (has no administration in time range)  sodium chloride 0.9 % bolus 1,000 mL (0 mLs Intravenous Stopped 09/03/18 1327)  ondansetron (ZOFRAN) injection 4 mg (4 mg Intravenous Given 09/03/18 1007)  promethazine (PHENERGAN) injection 25 mg (25 mg Intravenous Given 09/03/18 1051)  morphine 4 MG/ML injection 4 mg (4 mg Intravenous Given 09/03/18 1051)  iopamidol (ISOVUE-300) 61 % injection 100 mL (100 mLs Intravenous Contrast Given 09/03/18 1220)     Initial Impression / Assessment and Plan / ED Course  I have reviewed the triage vital signs and the nursing notes.  Pertinent labs & imaging results that were available during my care of the patient were reviewed by me and considered in my medical decision making (see chart for details).     MADDI COLLAR is a 17 y.o. female who presents to ED for nausea, vomiting which began last night. On exam, patient is afebrile, non-toxic appearing with generalized tenderness, most significantly in RLQ. Labs reviewed and notable for leukocytosis of 16.  Given tenderness to the right lower quadrant and leukocytosis, CT abdomen obtained showing  no acute abnormality. Elevated glucose at 131 - discussed PCP follow up.  Upon reevaluation after fluid bolus and medications, patient is resting comfortably in the bed and feels much improved.  Repeat abdominal exam improved well. Evaluation does not show pathology that would require ongoing emergent intervention or inpatient treatment. PCP follow up encouraged. Spoke at length with patient about signs or symptoms that should prompt return to emergency Department including inability to tolerate PO, blood in the stools, fevers, focal localization of abdominal pain, new/worsening symptoms or any additional concerns. Patient understands diagnosis and plan of care as dictated above. All questions answered.   Final Clinical Impressions(s) / ED Diagnoses   Final diagnoses:  Non-intractable vomiting with  nausea, unspecified vomiting type    ED Discharge Orders         Ordered    promethazine (PHENERGAN) 25 MG tablet  Every 6 hours PRN     09/03/18 1313    dicyclomine (BENTYL) 20 MG tablet  2 times daily PRN     09/03/18 1313           Ward, Chase Picket, PA-C 09/03/18 1450    Rolan Bucco, MD 09/03/18 1503

## 2018-10-30 ENCOUNTER — Emergency Department (HOSPITAL_COMMUNITY)
Admission: EM | Admit: 2018-10-30 | Discharge: 2018-10-30 | Disposition: A | Discharge status: Home / Self Care | Payer: Medicaid Other | Attending: Emergency Medicine | Admitting: Emergency Medicine

## 2018-10-30 ENCOUNTER — Encounter (HOSPITAL_COMMUNITY): Payer: Self-pay | Admitting: Emergency Medicine

## 2018-10-30 DIAGNOSIS — Z79899 Other long term (current) drug therapy: Secondary | ICD-10-CM | POA: Diagnosis not present

## 2018-10-30 DIAGNOSIS — R112 Nausea with vomiting, unspecified: Secondary | ICD-10-CM | POA: Diagnosis present

## 2018-10-30 DIAGNOSIS — Z7722 Contact with and (suspected) exposure to environmental tobacco smoke (acute) (chronic): Secondary | ICD-10-CM | POA: Insufficient documentation

## 2018-10-30 DIAGNOSIS — R109 Unspecified abdominal pain: Secondary | ICD-10-CM

## 2018-10-30 DIAGNOSIS — R1084 Generalized abdominal pain: Secondary | ICD-10-CM | POA: Diagnosis not present

## 2018-10-30 LAB — LIPASE, BLOOD: Lipase: 22 U/L (ref 11–51)

## 2018-10-30 LAB — RAPID URINE DRUG SCREEN, HOSP PERFORMED
Amphetamines: NOT DETECTED
Barbiturates: NOT DETECTED
Benzodiazepines: NOT DETECTED
COCAINE: NOT DETECTED
Opiates: NOT DETECTED
Tetrahydrocannabinol: POSITIVE — AB

## 2018-10-30 LAB — COMPREHENSIVE METABOLIC PANEL
ALT: 31 U/L (ref 0–44)
AST: 43 U/L — ABNORMAL HIGH (ref 15–41)
Albumin: 5 g/dL (ref 3.5–5.0)
Alkaline Phosphatase: 77 U/L (ref 38–126)
Anion gap: 14 (ref 5–15)
BUN: 12 mg/dL (ref 6–20)
CO2: 23 mmol/L (ref 22–32)
Calcium: 9.5 mg/dL (ref 8.9–10.3)
Chloride: 106 mmol/L (ref 98–111)
Creatinine, Ser: 0.71 mg/dL (ref 0.44–1.00)
GFR calc Af Amer: >60 mL/min (ref >60)
GFR calc non Af Amer: >60 mL/min (ref >60)
Glucose, Bld: 139 mg/dL — ABNORMAL HIGH (ref 70–99)
POTASSIUM: 3.7 mmol/L (ref 3.5–5.1)
Sodium: 143 mmol/L (ref 135–145)
Total Bilirubin: 0.9 mg/dL (ref 0.3–1.2)
Total Protein: 7.8 g/dL (ref 6.5–8.1)

## 2018-10-30 LAB — CBC
HCT: 42.4 % (ref 36.0–46.0)
Hemoglobin: 14.2 g/dL (ref 12.0–15.0)
MCH: 30.1 pg (ref 26.0–34.0)
MCHC: 33.5 g/dL (ref 30.0–36.0)
MCV: 90 fL (ref 80.0–100.0)
Platelets: 380 10*3/uL (ref 150–400)
RBC: 4.71 MIL/uL (ref 3.87–5.11)
RDW: 12.6 % (ref 11.5–15.5)
WBC: 15.3 10*3/uL — ABNORMAL HIGH (ref 4.0–10.5)
nRBC: 0 % (ref 0.0–0.2)

## 2018-10-30 LAB — URINALYSIS, ROUTINE W REFLEX MICROSCOPIC
Bilirubin Urine: NEGATIVE
Glucose, UA: NEGATIVE mg/dL
Hgb urine dipstick: NEGATIVE
Ketones, ur: 20 mg/dL — AB
Nitrite: NEGATIVE
PROTEIN: 30 mg/dL — AB
Specific Gravity, Urine: 1.014 (ref 1.005–1.030)
pH: 9 — ABNORMAL HIGH (ref 5.0–8.0)

## 2018-10-30 LAB — I-STAT BETA HCG BLOOD, ED (MC, WL, AP ONLY): I-stat hCG, quantitative: <5 m[IU]/mL (ref <5)

## 2018-10-30 MED ORDER — PROMETHAZINE HCL 25 MG/ML IJ SOLN
12.5000 mg | Freq: Once | INTRAMUSCULAR | Status: AC
  Administered 2018-10-30: 12.5 mg via INTRAVENOUS
  Filled 2018-10-30: qty 1

## 2018-10-30 MED ORDER — MORPHINE SULFATE (PF) 4 MG/ML IV SOLN: 4.0000 mg | Freq: Once | INTRAVENOUS | Status: DC

## 2018-10-30 MED ORDER — ONDANSETRON HCL 4 MG/2ML IJ SOLN
4.0000 mg | Freq: Once | INTRAMUSCULAR | Status: AC
  Administered 2018-10-30: 4 mg via INTRAVENOUS
  Filled 2018-10-30: qty 2

## 2018-10-30 MED ORDER — SODIUM CHLORIDE 0.9 % IV BOLUS
1000.0000 mL | Freq: Once | INTRAVENOUS | Status: AC
  Administered 2018-10-30: 1000 mL via INTRAVENOUS

## 2018-10-30 MED ORDER — KETOROLAC TROMETHAMINE 30 MG/ML IJ SOLN
30.0000 mg | Freq: Once | INTRAMUSCULAR | Status: AC
  Administered 2018-10-30: 30 mg via INTRAVENOUS
  Filled 2018-10-30: qty 1

## 2018-10-30 MED ORDER — METOCLOPRAMIDE HCL 5 MG/ML IJ SOLN: 10.0000 mg | Freq: Once | INTRAMUSCULAR | Status: DC

## 2018-10-30 MED ORDER — PROMETHAZINE HCL 25 MG/ML IJ SOLN: 25.0000 mg | Freq: Once | INTRAMUSCULAR | Status: DC

## 2018-10-30 MED ORDER — DIPHENHYDRAMINE HCL 50 MG/ML IJ SOLN: 12.5000 mg | Freq: Once | INTRAMUSCULAR | Status: DC

## 2018-10-30 MED ORDER — PROMETHAZINE HCL 25 MG PO TABS: 25.0000 mg | ORAL_TABLET | Freq: Four times a day (QID) | ORAL | 0 refills | Status: DC | PRN

## 2018-10-30 NOTE — ED Triage Notes (Signed)
Patient here from home with complaints of abd pain, nausea, vomiting that started last night.

## 2018-10-30 NOTE — ED Provider Notes (Signed)
[facility]-EMERGENCY DEPT Provider Note   CSN: [id number] Arrival date & time: 10/30/18  1051     History   Chief Complaint Chief Complaint  Patient presents with  . Abdominal Pain  . Nausea  . Emesis    HPI Melinda Calderon is a 17 y.o. female.  HPI Melinda Calderon is a 17 y.o. female presents with complaint of nausea, vomiting, abdominal pain onset yesterday.  Patient states this is a recurrent issue for her.  She has not seen a specialist in the past.  She states she is unable to stop vomiting.  She has not tried any medications.  She denies any diarrhea.  No urinary symptoms.  Denies being pregnant.  She states abdominal pain is diffuse, describes it as crampy.  Denies any marijuana use.  States to me that she has never smoked marijuana, as she smells strongly of marijuana during my exam.  Past Medical History:  Diagnosis Date  . Anxiety   . Seizures [facility](HCC)     Patient Active Problem List   Diagnosis Date Noted  . 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 Panic disorder 10/23/2015  . Difficulty controlling anger 09/13/2015  . Dysphonia 09/12/2015  . Episodic tension-type headache, not intractable 02/15/2015  . Syncope and collapse 02/15/2015  . Migraine without aura 02/15/2015  . Lack of adequate sleep 02/15/2015  . 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 Encounter for long-term (current) use of other medications 02/22/2014  . Seizure disorder (HCC) 12/27/2011  . Seizure (HCC) 12/27/2011  . Headache(784.0) 12/27/2011    History reviewed. No pertinent surgical history.   OB History   None      Home Medications    Prior to Admission medications   Medication Sig Start Date End Date Taking? Authorizing Provider  cetirizine (ZYRTEC) 10 MG tablet Take 10 mg by mouth daily as needed for allergies.  01/26/18   [provider]  dicyclomine (BENTYL) 20 MG tablet Take 1  tablet (20 mg total) by mouth 2 (two) times daily as needed (Abdominal cramping). 09/03/18   Ward, Chase PicketJaime Pilcher, PA-C  fluticasone (FLONASE) 50 MCG/ACT nasal spray Place 2 sprays into both nostrils daily as needed for allergies or rhinitis.  01/26/18   [provider]  ibuprofen (ADVIL,MOTRIN) 200 MG tablet Take 400 mg by mouth daily as needed for moderate pain.     [provider]  lamoTRIgine (LAMICTAL) 100 MG tablet Take 2 tablets by mouth twice per day Patient taking differently: Take 200 mg by mouth 2 (two) times daily. Take 2 tablets by mouth twice per day 10/08/17   Elveria RisingGoodpasture, Tina, NP  levETIRAcetam (KEPPRA) 500 MG tablet Take 2 tablets twice per day Patient taking differently: Take 1,000 mg by mouth 2 (two) times daily.  10/08/17   Elveria RisingGoodpasture, Tina, NP  omeprazole (PRILOSEC) 20 MG capsule Take 1 capsule (20 mg total) by mouth daily. Patient taking differently: Take 20 mg by mouth daily as needed (heartburn).  04/26/18   Maczis, Elmer SowMichael M, PA-C  ondansetron (ZOFRAN ODT) 4 MG disintegrating tablet Take 1 tablet (4 mg total) by mouth every 8 (eight) hours as needed for nausea or vomiting. Patient not taking: Reported on 09/03/2018 03/04/18   Little, Ambrose Finlandachel Morgan, MD  ondansetron (ZOFRAN) 4 MG tablet Take 1 tablet (4 mg total) by mouth every 8 (eight) hours as needed for nausea or vomiting. Patient not taking: Reported on 09/03/2018 04/26/18   Jacinto HalimMaczis, Michael M,  PA-C  PARoxetine (PAXIL) 30 MG tablet Take 1 tablet at bedtime Patient taking differently: Take 30 mg by mouth at bedtime.  04/27/18   Elveria Rising, NP  Perampanel (FYCOMPA) 2 MG TABS TAKE 1 TABLET BY MOUTH EVERYDAY AT BEDTIME Patient not taking: Reported on 09/03/2018 02/22/18   Deetta Perla, MD  promethazine (PHENERGAN) 25 MG tablet Take 1 tablet (25 mg total) by mouth every 6 (six) hours as needed for nausea. 09/03/18   Ward, Chase Picket, PA-C  sertraline (ZOLOFT) 50 MG tablet Take 50 mg by mouth at bedtime.     [provider]    Family History Family History  Problem Relation Age of Onset  . Hypertension Mother   . Cancer Maternal Grandmother        Died at 75  . Cancer Paternal Grandmother        Died at 41  . Epilepsy Maternal Grandfather   . Cirrhosis Maternal Grandfather        Died at 29  . Seizures Paternal Grandfather        Died at 40  . Epilepsy Paternal Aunt     Social History Social History   Tobacco Use  . Smoking status: Passive Smoke Exposure - Never Smoker  . Smokeless tobacco: Never Used  Substance Use Topics  . Alcohol use: No    Alcohol/week: 0.0 standard drinks  . Drug use: No     Allergies   Patient has no known allergies.   Review of Systems Review of Systems  Constitutional: Negative for chills and fever.  Respiratory: Negative for cough, chest tightness and shortness of breath.   Cardiovascular: Negative for chest pain, palpitations and leg swelling.  Gastrointestinal: Positive for abdominal pain, nausea and vomiting. Negative for diarrhea.  Genitourinary: Negative for dysuria, flank pain, pelvic pain, vaginal bleeding, vaginal discharge and vaginal pain.  Musculoskeletal: Negative for arthralgias, myalgias, neck pain and neck stiffness.  Skin: Negative for rash.  Neurological: Negative for dizziness, weakness and headaches.  All other systems reviewed and are negative.    Physical Exam Updated Vital Signs BP 130/76 (BP Location: Right Arm)   Pulse 89   Temp 98.8 F (37.1 C) (Oral)   Resp 18   SpO2 100%   Physical Exam  Constitutional: She appears well-developed and well-nourished. No distress.  HENT:  Head: Normocephalic.  Eyes: Conjunctivae are normal.  Neck: Neck supple.  Cardiovascular: Normal rate, regular rhythm and normal heart sounds.  Pulmonary/Chest: Effort normal and breath sounds normal. No respiratory distress. She has no wheezes. She has no rales.  Abdominal: Soft. Bowel sounds are normal. She exhibits no  distension. There is tenderness. There is no rigidity, no rebound and no guarding.  Diffuse tenderness  Musculoskeletal: She exhibits no edema.  Neurological: She is alert.  Skin: Skin is warm and dry.  Psychiatric: She has a normal mood and affect. Her behavior is normal.  Nursing note and vitals reviewed.    ED Treatments / Results  Labs (all labs ordered are listed, but only abnormal results are displayed) Labs Reviewed  COMPREHENSIVE METABOLIC PANEL - Abnormal; Notable for the following components:      Result Value   Glucose, Bld 139 (*)    AST 43 (*)    All other components within normal limits  CBC - Abnormal; Notable for the following components:   WBC 15.3 (*)    All other components within normal limits  URINALYSIS, ROUTINE W REFLEX MICROSCOPIC - Abnormal; Notable for  the following components:   APPearance HAZY (*)    pH 9.0 (*)    Ketones, ur 20 (*)    Protein, ur 30 (*)    Leukocytes, UA TRACE (*)    Bacteria, UA RARE (*)    All other components within normal limits  RAPID URINE DRUG SCREEN, HOSP PERFORMED - Abnormal; Notable for the following components:   Tetrahydrocannabinol POSITIVE (*)    All other components within normal limits  LIPASE, BLOOD  I-STAT BETA HCG BLOOD, ED ([facility], [facility], [facility] ONLY)    EKG None  Radiology No results found.  Procedures Procedures (including critical care time)  Medications Ordered in ED Medications  ketorolac (TORADOL) 30 MG/ML injection 30 mg (has no administration in time range)  sodium chloride 0.9 % bolus 1,000 mL (1,000 mLs Intravenous New Bag/Given 10/30/18 1242)  ondansetron (ZOFRAN) injection 4 mg (4 mg Intravenous Given 10/30/18 1243)  promethazine (PHENERGAN) injection 12.5 mg (12.5 mg Intravenous Given 10/30/18 1254)     Initial Impression / Assessment and Plan / ED Course  I have reviewed the triage vital signs and the nursing notes.  Pertinent labs & imaging results that were available during my care of the  patient were reviewed by me and considered in my medical decision making (see chart for details).     Patient with nausea, vomiting, diffuse abdominal cramping, onset yesterday.  She is actively vomiting as she is sitting in the stretcher.  Clear emesis.  Abdomen is diffusely tender.  No focal tenderness, no guarding, no rebound tenderness.  This is a recurrent issue for the patient.  She denies marijuana use but again she smells really strongly of marijuana.  I reviewed the chart, patient has been seen here with similar symptoms in the past.  Question whether she could have a cannabis induced cyclical vomiting syndrome.  Will check labs, urine analysis, urine pregnancy, give IV fluids and antiemetics. No surgical abdomen on exam.   1:50 PM Nausea slightly improved after Zofran and Phenergan.  Continues to have pain.  Will try Toradol, Reglan, Benadryl.  She states she still nauseated.  3:22 PM Patient never did get Reglan and Benadryl.  She is too sleepy.  She has been asleep, no more vomiting.  When I woke her up, she stated she is feeling better.  She is able to drink some ginger ale.  I question whether her symptoms could be due to a cannabinoid cyclical vomiting syndrome.  Her drug screen is positive for tetrahydrocannabinol.  I will follow-up with family doctor closely.  Instructed to avoid cannabis.  Take Phenergan for nausea vomiting at home.  Advance diet as tolerated.  Return if needed  Vitals:   10/30/18 1104 10/30/18 1155 10/30/18 1407  BP: 117/63 130/76 99/65  Pulse: 89  86  Resp: 18  16  Temp: 98.8 F (37.1 C)    TempSrc: Oral    SpO2: 100%  100%     Final Clinical Impressions(s) / ED Diagnoses   Final diagnoses:  Non-intractable vomiting with nausea, unspecified vomiting type  Abdominal pain, unspecified abdominal location    ED Discharge Orders    None       Jaynie Crumble, PA-C 10/30/18 1524    Lorre Nick, MD 11/01/18 [id number]

## 2018-10-30 NOTE — Discharge Instructions (Addendum)
Avoid marijuana. Take phenergan  as prescribed as needed for nausea and vomiting. Drink plenty of fluids. Return if worsening.

## 2018-11-11 ENCOUNTER — Ambulatory Visit (INDEPENDENT_AMBULATORY_CARE_PROVIDER_SITE_OTHER): Payer: Medicaid Other | Admitting: Family

## 2018-11-11 ENCOUNTER — Other Ambulatory Visit (INDEPENDENT_AMBULATORY_CARE_PROVIDER_SITE_OTHER): Payer: Self-pay | Admitting: Family

## 2018-11-11 DIAGNOSIS — G40309 Generalized idiopathic epilepsy and epileptic syndromes, not intractable, without status epilepticus: Secondary | ICD-10-CM

## 2018-11-11 DIAGNOSIS — F410 Panic disorder [episodic paroxysmal anxiety] without agoraphobia: Secondary | ICD-10-CM

## 2018-11-11 MED ORDER — PAROXETINE HCL 30 MG PO TABS: ORAL_TABLET | ORAL | 0 refills | Status: DC

## 2018-11-11 MED ORDER — LEVETIRACETAM 500 MG PO TABS: ORAL_TABLET | ORAL | 0 refills | Status: DC

## 2018-11-11 MED ORDER — LAMOTRIGINE 100 MG PO TABS: ORAL_TABLET | ORAL | 0 refills | Status: DC

## 2018-11-30 ENCOUNTER — Ambulatory Visit (INDEPENDENT_AMBULATORY_CARE_PROVIDER_SITE_OTHER): Payer: Medicaid Other | Admitting: Family

## 2018-11-30 ENCOUNTER — Encounter (INDEPENDENT_AMBULATORY_CARE_PROVIDER_SITE_OTHER): Payer: Self-pay | Admitting: Family

## 2018-11-30 VITALS — BP 100/62 | HR 72 | Ht 61.0 in | Wt 139.2 lb

## 2018-11-30 DIAGNOSIS — G40909 Epilepsy, unspecified, not intractable, without status epilepticus: Secondary | ICD-10-CM | POA: Diagnosis not present

## 2018-11-30 DIAGNOSIS — R55 Syncope and collapse: Secondary | ICD-10-CM

## 2018-11-30 DIAGNOSIS — G40309 Generalized idiopathic epilepsy and epileptic syndromes, not intractable, without status epilepticus: Secondary | ICD-10-CM | POA: Diagnosis not present

## 2018-11-30 DIAGNOSIS — R32 Unspecified urinary incontinence: Secondary | ICD-10-CM

## 2018-11-30 DIAGNOSIS — F410 Panic disorder [episodic paroxysmal anxiety] without agoraphobia: Secondary | ICD-10-CM

## 2018-11-30 DIAGNOSIS — F445 Conversion disorder with seizures or convulsions: Secondary | ICD-10-CM | POA: Diagnosis not present

## 2018-11-30 DIAGNOSIS — Z72820 Sleep deprivation: Secondary | ICD-10-CM

## 2018-11-30 MED ORDER — PAROXETINE HCL 30 MG PO TABS: ORAL_TABLET | ORAL | 1 refills | Status: DC

## 2018-11-30 MED ORDER — LAMOTRIGINE 100 MG PO TABS: ORAL_TABLET | ORAL | 1 refills | Status: DC

## 2018-11-30 MED ORDER — FYCOMPA 2 MG PO TABS: ORAL_TABLET | ORAL | 1 refills | Status: DC

## 2018-11-30 MED ORDER — LEVETIRACETAM 500 MG PO TABS: ORAL_TABLET | ORAL | 1 refills | Status: DC

## 2018-11-30 NOTE — Patient Instructions (Signed)
Thank you for coming in today.   Instructions for you until your next appointment are as follows: 1. We will schedule you for an EEG (looks for seizures and abnormal brain waves). If that is normal, we will probably schedule you for a 72 hour study at home. 2. We will also schedule you for an EKG to check your heart. Some medications can affect the way the heart beats so we need to check that before changing any of your medication.  3. Make an appointment to see your PCP about your stomach pain and urinary incontinence. It is common to urinate on yourself during seizures but not when seizures are not occurring 4. Work on getting more sleep at night since you are out of school. Try to stay awake and active during the day, and get into a more normal sleep pattern at night.  5. Please plan to return for follow up in 4 weeks or sooner if needed so that we can review these test results and make plans for what to do next.

## 2018-11-30 NOTE — Progress Notes (Signed)
Patient: Melinda Calderon MRN: [id number] Sex: female DOB: 09/08/2001  Provider: Elveria Risingina Aalijah Mims, NP Location of Care: [facility]  Note type: Routine return visit  History of Present Illness: Referral Source: Melinda DuncansMelinda Paul, MD History from: mother, patient and [facility] chart Chief Complaint: Seizures  Melinda Melinda Calderon is a 17 y.o. girl with history of generalized convulsive seizures, non-epileptic events and panic disorder. She was last seen January 13, 2018 and has missed 6 scheduled appointments since that time. 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 She also has episodes of loss of awareness that are non-epileptic in nature. She had a 24 hour ambulatory EEG in February 2017 that was normal. She is taking and tolerating Fycompa, Lamotrigine and Levetiracetam for her seizure disorder, and takes Paroxetine for panic disorder. She says that she has been compliant with her medications.   Melinda Calderon and her mother tell me today  For about the last 3 months, her seizure events have increased in frequency and are lasting longer. She estimates a seizure every other day that can last as long as 10 minutes. She was seen in the ER 3 weeks ago for stomach pain and seizure event. Melinda Calderon complains of ongoing stomach pain and says that tests have been negative. She and her mother also report that she has been incontinent of urine and has been wearing incontinence underwear for that. She has urinary incontinence with seizure events as well as spontaneously. She has been incontinent of stool once with a seizure.   Melinda Calderon reports being unable to sleep most nights. Her mother says that because of her frequent seizures, she has been sleeping in the family room on an inflatable mattress, rather than upstairs in her bed, so that she is closer to her mother when seizures occur. Mom  says that Melinda Calderon sleeps during the day more so than at night. Melinda Calderon recently finished courses to obtain her high school diploma and wants to apply to [employer] in the fall but her mother is concerned about her ability to do so with her seizure frequency.   [name] says that panic has not been problematic recently except for when seizures occur because they frighten her. She says that she had some increased anxiety and panic last semester as she finished her course work but that has improved now that she is out of school.   [name] has been otherwise generally healthy since she was last seen. Neither she nor her mother have other health concerns for her today other than previously mentioned.  Review of Systems: Please see the HPI for neurologic and other pertinent review of systems. Otherwise, all other systems were reviewed and were negative.    Past Medical History:  Diagnosis Date  . Anxiety   . Seizures (HCC)    Hospitalizations: Yes.  , Head Injury: No., Nervous System Infections: No., Immunizations up to date: Yes.   Past Medical History Comments: See HPI Copied from previous record: There have been problems with multiple missed appointments, difficulties obtaining clear history and concerns about possible non-epileptic events. [facility].  Surgical History History reviewed. No pertinent surgical history.  Family History family history includes Cancer in her maternal grandmother and paternal grandmother; Cirrhosis in her maternal grandfather; Epilepsy in her maternal grandfather and paternal aunt; Hypertension in her mother; Seizures in her paternal grandfather. Family  History is otherwise negative for migraines, seizures, cognitive impairment, blindness, deafness, birth defects, chromosomal disorder, autism.  Social History Social History   Socioeconomic History  . Marital status: Single    Spouse name:  Not on file  . Number of children: Not on file  . Years of education: Not on file  . Highest education level: Not on file  Occupational History  . Not on file  Social Needs  . Financial resource strain: Not on file  . Food insecurity:    Worry: Not on file    Inability: Not on file  . Transportation needs:    Medical: Not on file    Non-medical: Not on file  Tobacco Use  . Smoking status: Passive Smoke Exposure - Never Smoker  . Smokeless tobacco: Never Used  Substance and Sexual Activity  . Alcohol use: No    Alcohol/week: 0.0 standard drinks  . Drug use: No  . Sexual activity: Never  Lifestyle  . Physical activity:    Days per week: Not on file    Minutes per session: Not on file  . Stress: Not on file  Relationships  . Social connections:    Talks on phone: Not on file    Gets together: Not on file    Attends religious service: Not on file    Active member of club or organization: Not on file    Attends meetings of clubs or organizations: Not on file    Relationship status: Not on file  Other Topics Concern  . Not on file  Social History Narrative   Melinda Calderon recently graduated HS; she will be attending [employer]>   She lives with her parents and sibling.    She enjoys watching TV and being on her phone.    Allergies No Known Allergies  Physical Exam BP 100/62   Pulse 72   Ht 5\' 1"  (1.549 m)   Wt 139 lb 3.2 oz (63.1 kg)   BMI 26.30 kg/m  General: Well developed, well nourished girl, seated on exam table, in no evident distress, black hair, brown eyes, right handed Head: Head normocephalic and atraumatic.  Oropharynx benign. Neck: Supple with no carotid bruits Cardiovascular: Regular rate and rhythm, no murmurs Respiratory: Breath sounds clear to auscultation Musculoskeletal: No obvious deformities or scoliosis Skin: No rashes or neurocutaneous lesions  Neurologic Exam Mental Status: Awake and fully alert.  She is wearing pajamas for this afternoon  appointment. Oriented to place and time.  Recent and remote memory intact.  Attention span, concentration, and fund of knowledge appropriate but her behavior is less mature than expected for her age.  Mood and affect appropriate. Cranial Nerves: Fundoscopic exam reveals sharp disc margins.  Pupils equal, briskly reactive to light.  Extraocular movements full without nystagmus.  Visual fields full to confrontation.  Hearing intact and symmetric to finger rub.  Facial sensation intact.  Face tongue, palate move normally and symmetrically.  Neck flexion and extension normal. Motor: Normal bulk and tone. Normal strength in all tested extremity muscles. Sensory: Intact to touch and temperature in all extremities.  Coordination: Rapid alternating movements normal in all extremities.  Finger-to-nose and heel-to shin performed accurately bilaterally.  Romberg negative. Gait and Station: Arises from chair without difficulty.  Stance is normal. Gait demonstrates normal stride length and balance.   Able to heel, toe and tandem walk without difficulty. Reflexes: 1+ and symmetric. Toes downgoing.  Impression 1.  Generalized non-convulsive epilepsy 2.  Non-epileptic events 3.  Panic disorder  4.  Disordered sleep 5.  Problems with medication non-compliance 6.  Reports of urinary incontinence 7.  History of overdose and admission to behavioral health in January 2018.   Recommendations for plan of care The patient's previous [facility] records were reviewed. Shamar has neither had nor required imaging or lab studies since the last visit other than what was performed in the ER recently during a visit for abdominal pain. She is a 17 year old girl with history of generalized non-convulsive epilepsy, non-epileptic events, panic disorder, disordered sleep and problems with medication non-compliance. She is taking and tolerating Fycompa, Lamotrigine and Levetiracetam for her seizure disorder, and Paroxetine for panic  disorder. There are other medications listed that she is not sure if she is taking, so I am concerned about medication compliance overall. I asked her to go through her medications and make a list of what she is actually taking and bring it to be at her next visit. I also asked her to bring her medications with her to each visit so that I can check them. Because of concerns about what she is taking and risk of prolonged QT, I ordered an EKG to evaluate that. I am concerned about her seizure frequency and her reports of disordered sleep but I will not prescribe any other medication until I have obtained the EKG report. I also recommended an EEG to be done in this office. She may need to have another prolonged 72 hour study performed, or to be referred to [facility]. We talked about her abdominal pain as well as her reports of urinary incontinence not in the setting of seizure, and I asked her to follow up with her PCP about those problems. I asked her to return for follow up in 4 weeks to review results of the EKG and EEG. I talked with her about her sleep and explained how that sleep deprivation can trigger seizures as well as worsen mood. I asked her to try to get on a regular sleep schedule of sleeping at night and being awake during the day. Lacretia and her mother agreed with the plans made today.   The medication list was reviewed and reconciled.  No changes were made in the prescribed medications today.  A complete medication list was provided to the patient.  Allergies as of 11/30/2018   No Known Allergies     Medication List       Accurate as of November 30, 2018 11:59 PM. Always use your most recent med list.        cetirizine 10 MG tablet Commonly known as:  ZYRTEC Take 10 mg by mouth daily as needed for allergies.   dicyclomine 20 MG tablet Commonly known as:  BENTYL Take 1 tablet (20 mg total) by mouth 2 (two) times daily as needed (Abdominal cramping).   fluticasone 50 MCG/ACT  nasal spray Commonly known as:  FLONASE Place 2 sprays into both nostrils daily as needed for allergies or rhinitis.   FYCOMPA 2 MG Tabs Generic drug:  Perampanel TAKE 1 TABLET BY MOUTH EVERYDAY AT BEDTIME   ibuprofen 200 MG tablet Commonly known as:  ADVIL,MOTRIN Take 400 mg by mouth daily as needed for moderate pain.   lamoTRIgine 100 MG tablet Commonly known as:  LAMICTAL Take 2 tablets by mouth twice per day   levETIRAcetam 500 MG tablet Commonly known as:  KEPPRA Take 2 tablets twice per day   omeprazole 20 MG capsule Commonly known as:  PRILOSEC Take 1 capsule (  20 mg total) by mouth daily.   ondansetron 4 MG disintegrating tablet Commonly known as:  ZOFRAN ODT Take 1 tablet (4 mg total) by mouth every 8 (eight) hours as needed for nausea or vomiting.   ondansetron 4 MG tablet Commonly known as:  ZOFRAN Take 1 tablet (4 mg total) by mouth every 8 (eight) hours as needed for nausea or vomiting.   PARoxetine 30 MG tablet Commonly known as:  PAXIL Take 1 tablet at bedtime   promethazine 25 MG tablet Commonly known as:  PHENERGAN Take 1 tablet (25 mg total) by mouth every 6 (six) hours as needed for nausea or vomiting.   sertraline 50 MG tablet Commonly known as:  ZOLOFT Take 50 mg by mouth at bedtime.       Total time spent with the patient was 40 minutes, of which 50% or more was spent in counseling and coordination of care.   Elveria Risingina Rayola Everhart NP-C

## 2018-12-02 ENCOUNTER — Ambulatory Visit (INDEPENDENT_AMBULATORY_CARE_PROVIDER_SITE_OTHER): Payer: Medicaid Other | Admitting: Family

## 2018-12-03 ENCOUNTER — Encounter (INDEPENDENT_AMBULATORY_CARE_PROVIDER_SITE_OTHER): Payer: Self-pay | Admitting: Family

## 2018-12-06 ENCOUNTER — Other Ambulatory Visit (INDEPENDENT_AMBULATORY_CARE_PROVIDER_SITE_OTHER): Payer: Medicaid Other

## 2018-12-21 ENCOUNTER — Telehealth (INDEPENDENT_AMBULATORY_CARE_PROVIDER_SITE_OTHER): Payer: Self-pay | Admitting: Family

## 2018-12-21 NOTE — Telephone Encounter (Signed)
Medication list has been faxed along with office notes that were requested via release of records request.

## 2018-12-21 NOTE — Telephone Encounter (Signed)
°  Who's calling (name and relationship to patient) : Synetta Fail [facility]) Best contact number: [phone] Provider they see: Inetta Fermo  Reason for call: Synetta Fail requested a medication list with directions for pt. Information can be faxed to the number provided.   (F) [phone]  (

## 2018-12-30 ENCOUNTER — Ambulatory Visit (INDEPENDENT_AMBULATORY_CARE_PROVIDER_SITE_OTHER): Payer: Medicaid Other | Admitting: Family

## 2019-01-06 ENCOUNTER — Ambulatory Visit (INDEPENDENT_AMBULATORY_CARE_PROVIDER_SITE_OTHER): Payer: Medicaid Other | Admitting: Family

## 2019-01-06 ENCOUNTER — Encounter (INDEPENDENT_AMBULATORY_CARE_PROVIDER_SITE_OTHER): Payer: Self-pay | Admitting: Family

## 2019-01-06 VITALS — BP 116/82 | HR 96 | Ht 60.0 in | Wt 144.6 lb

## 2019-01-06 DIAGNOSIS — R531 Weakness: Secondary | ICD-10-CM | POA: Diagnosis not present

## 2019-01-06 DIAGNOSIS — G40309 Generalized idiopathic epilepsy and epileptic syndromes, not intractable, without status epilepticus: Secondary | ICD-10-CM | POA: Diagnosis not present

## 2019-01-06 DIAGNOSIS — F410 Panic disorder [episodic paroxysmal anxiety] without agoraphobia: Secondary | ICD-10-CM | POA: Diagnosis not present

## 2019-01-06 DIAGNOSIS — R32 Unspecified urinary incontinence: Secondary | ICD-10-CM

## 2019-01-06 DIAGNOSIS — Z79899 Other long term (current) drug therapy: Secondary | ICD-10-CM

## 2019-01-06 MED ORDER — PAROXETINE HCL 30 MG PO TABS: ORAL_TABLET | ORAL | 3 refills | Status: DC

## 2019-01-06 MED ORDER — FYCOMPA 4 MG PO TABS: ORAL_TABLET | ORAL | 1 refills | Status: DC

## 2019-01-06 NOTE — Progress Notes (Signed)
Patient: Melinda Calderon MRN: [id number] Sex: female DOB: 02-20-2001  Provider: Elveria Rising, NP Location of Care: [facility]  Note type: Routine return visit   History of Present Illness: Referral Source: Marge Duncans, MD History from: father, patient and [facility] chart Chief Complaint: Seizures  Melinda Calderon is a 18 y.o. young woman with history of generalized convulsive seizures, non-epileptic events, and panic disorder. She was last seen November 30, 2018.  [name] is taking and tolerating Lamotrigine, Levetiracetam and Fycompa for her seizure disorder but unfortunately continues to experience near daily seizures. She tells me today that she is experiencing seizures lasting 1-3 minutes but can last up to 15 minutes. [name] says that she awakens most days soaked in urine and has been using incontinence undergarments because she also has urinary incontinence during the day. She also occasionally bites her tongue and shows me a laceration to the left side of her tongue from an event 2 days ago. When she was last seen, I asked her to have an EKG performed because of complaints of weakness and because of her multiple medications with potential for prolonged QT. The EKG was not performed for reasons unclear to me. [name] says that she has tried to work on sleeping more at night but still tends to be awake at night and sleep all day.   [name] also has panic disorder but says that she has been doing well for the most part. She awakens from seizures confused and frightened, and says that sometimes she panics after a seizure. She would like to attend [employer] but is unwilling to apply while she is experience urinary incontinence.   [name] has been otherwise generally healthy since she was last seen. Neither she nor her father have other health concerns for her today other than previously mentioned.  Review of Systems: Please see the HPI for neurologic and other pertinent  review of systems. Otherwise, all other systems were reviewed and were negative.    Past Medical History:  Diagnosis Date  . Anxiety   . Seizures (HCC)    Hospitalizations: No., Head Injury: No., Nervous System Infections: No., Immunizations up to date: Yes.   Past Medical History Comments: See HPI Copied from previous record: There have been problems with multiple missed appointments, difficulties obtaining clear history and concerns about possible non-epileptic events. She had an admission to [facility] in January 2018 for intentional overdose.  Surgical History History reviewed. No pertinent surgical history.  Family History family history includes Cancer in her maternal grandmother and paternal grandmother; Cirrhosis in her maternal grandfather; Epilepsy in her maternal grandfather and paternal aunt; Hypertension in her mother; Seizures in her paternal grandfather. Family History is otherwise negative for migraines, seizures, cognitive impairment, blindness, deafness, birth defects, chromosomal disorder, autism.  Social History Social History   Socioeconomic History  . Marital status: Single    Spouse name: Not on file  . Number of children: Not on file  . Years of education: Not on file  . Highest education level: Not on file  Occupational History  . Not on file  Social Needs  . Financial resource strain: Not on file  . Food insecurity:    Worry: Not on file    Inability: Not on file  . Transportation needs:    Medical: Not on file    Non-medical: Not on file  Tobacco Use  . Smoking status: Passive Smoke Exposure - Never Smoker  . Smokeless tobacco: Never Used  Substance  and Sexual Activity  . Alcohol use: No    Alcohol/week: 0.0 standard drinks  . Drug use: No  . Sexual activity: Never  Lifestyle  . Physical activity:    Days per week: Not on file    Minutes per session: Not on file  . Stress: Not on file  Relationships  . Social connections:     Talks on phone: Not on file    Gets together: Not on file    Attends religious service: Not on file    Active member of club or organization: Not on file    Attends meetings of clubs or organizations: Not on file    Relationship status: Not on file  Other Topics Concern  . Not on file  Social History Narrative   [name] recently graduated HS; she will be attending [employer]>   She lives with her parents and sibling.    She enjoys watching TV and being on her phone.    Allergies No Known Allergies  Physical Exam BP 116/82   Pulse 96   Ht 5' (1.524 m)   Wt 144 lb 9.6 oz (65.6 kg)   BMI 28.24 kg/m  General: Well developed, well nourished, seated, in no evident distress, black hair, brown eyes, right handed Head: Head normocephalic and atraumatic.  Oropharynx benign. She has a small healing laceration to the left side of her tongue Neck: Supple with no carotid bruits Cardiovascular: Regular rate and rhythm, no murmurs Respiratory: Breath sounds clear to auscultation Musculoskeletal: No obvious deformities or scoliosis Skin: No rashes or neurocutaneous lesions  Neurologic Exam Mental Status: Awake and fully alert.  Oriented to place and time.  Recent and remote memory intact.  Attention span, concentration, and fund of knowledge appropriate.  Mood and affect appropriate. Cranial Nerves: Fundoscopic exam reveals sharp disc margins.  Pupils equal, briskly reactive to light.  Extraocular movements full without nystagmus.  Visual fields full to confrontation.  Hearing intact and symmetric to finger rub.  Facial sensation intact.  Face tongue, palate move normally and symmetrically.  Neck flexion and extension normal. Motor: Normal bulk and tone. Normal strength in all tested extremity muscles. Sensory: Intact to touch and temperature in all extremities.  Coordination: Rapid alternating movements normal in all extremities.  Finger-to-nose and heel-to shin performed accurately bilaterally.   Romberg negative. Gait and Station: Arises from chair without difficulty.  Stance is normal. Gait demonstrates normal stride length and balance.   Able to heel, toe and tandem walk without difficulty. Reflexes: 1+ and symmetric. Toes downgoing.  Impression 1.   Generalized non-convulsive epilepsy 2.   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   Panic disorder 4.   Disordered sleep 5.   Urinary incontinence 6.   History of overdose and [facility]   Recommendations for plan of care [facility] Keltie has neither had nor required imaging or lab studies since the last visit. She is a 18 year old young woman with history of generalized non-convulsive epilepsy, probable non-epileptic events, panic disorder, disordered sleep and urinary incontinence. I recommended that Jamile increase the Fycompa dose to 4mg  and explained to her how to use up the 2mg  tablets that she has at home, then switch to 4mg  tablets. I instructed her to continue the Levetiracetam and Lamotrigine without change. I asked her to get the EKG done that was ordered at the last visit. I gave her blood test orders because of her ongoing complaints of weakness  and her reports of urinary incontinence. Finally, I recommended a 48 hour ambulatory EEG to be done at home. I will call Arriel when I receive results of these tests and I will see her back in follow up in 4 weeks or sooner if needed. Raeleigh and her father agreed with the plans made today.  The medication list was reviewed and reconciled. I reviewed changes that were made in the prescribed medications today.  A complete medication list was provided to the patient.  Allergies as of 01/06/2019   No Known Allergies     Medication List       Accurate as of January 06, 2019  5:01 PM. Always use your most recent med list.        cetirizine 10 MG tablet Commonly known as:  ZYRTEC Take 10 mg by mouth daily as  needed for allergies.   dicyclomine 20 MG tablet Commonly known as:  BENTYL Take 1 tablet (20 mg total) by mouth 2 (two) times daily as needed (Abdominal cramping).   fluticasone 50 MCG/ACT nasal spray Commonly known as:  FLONASE Place 2 sprays into both nostrils daily as needed for allergies or rhinitis.   FYCOMPA 4 MG Tabs Generic drug:  Perampanel Take 1 tablet at bedtime   ibuprofen 200 MG tablet Commonly known as:  ADVIL,MOTRIN Take 400 mg by mouth daily as needed for moderate pain.   lamoTRIgine 100 MG tablet Commonly known as:  LAMICTAL Take 2 tablets by mouth twice per day   levETIRAcetam 500 MG tablet Commonly known as:  KEPPRA Take 2 tablets twice per day   omeprazole 20 MG capsule Commonly known as:  PRILOSEC Take 1 capsule (20 mg total) by mouth daily.   ondansetron 4 MG disintegrating tablet Commonly known as:  ZOFRAN ODT Take 1 tablet (4 mg total) by mouth every 8 (eight) hours as needed for nausea or vomiting.   ondansetron 4 MG tablet Commonly known as:  ZOFRAN Take 1 tablet (4 mg total) by mouth every 8 (eight) hours as needed for nausea or vomiting.   PARoxetine 30 MG tablet Commonly known as:  PAXIL Take 1 tablet at bedtime   promethazine 25 MG tablet Commonly known as:  PHENERGAN Take 1 tablet (25 mg total) by mouth every 6 (six) hours as needed for nausea or vomiting.       Dr. Sharene SkeansHickling was consulted regarding the patient.   Total time spent with the patient was 35 minutes, of which 50% or more was spent in counseling and coordination of care.   Elveria Risingina Keith Cancio NP-C

## 2019-01-06 NOTE — Patient Instructions (Signed)
Thank you for coming in today.   Instructions for you until your next appointment are as follows: [name].  For the Fycompa 2mg  - we will increase that to 2 tablets until you use up the pills that you have . After you have finished the 2mg  tablets, change to the new prescription of Fycompa 4mg  and take 1 at bedtime . Continue taking the Lamotrigine and Levetiracetam as you have been - 2 tablets of each at morning and at night . Call [phone] and schedule the EKG . I am going to schedule you for a EEG that will be done at home. The EEG will stay on your head for 48 hours. A person from a company named [employer] will call you to schedule this . I have also ordered blood tests. Get these done today if you can. . Please plan to return to see me in 4 weeks or sooner if needed.

## 2019-01-07 ENCOUNTER — Other Ambulatory Visit (INDEPENDENT_AMBULATORY_CARE_PROVIDER_SITE_OTHER): Payer: Self-pay | Admitting: Family

## 2019-01-07 DIAGNOSIS — F445 Conversion disorder with seizures or convulsions: Secondary | ICD-10-CM

## 2019-01-07 DIAGNOSIS — R55 Syncope and collapse: Secondary | ICD-10-CM

## 2019-01-07 DIAGNOSIS — G40309 Generalized idiopathic epilepsy and epileptic syndromes, not intractable, without status epilepticus: Secondary | ICD-10-CM

## 2019-01-07 DIAGNOSIS — G40909 Epilepsy, unspecified, not intractable, without status epilepticus: Secondary | ICD-10-CM

## 2019-01-20 DIAGNOSIS — F445 Conversion disorder with seizures or convulsions: Secondary | ICD-10-CM

## 2019-01-20 DIAGNOSIS — G40309 Generalized idiopathic epilepsy and epileptic syndromes, not intractable, without status epilepticus: Secondary | ICD-10-CM | POA: Diagnosis not present

## 2019-01-20 DIAGNOSIS — G40909 Epilepsy, unspecified, not intractable, without status epilepticus: Secondary | ICD-10-CM

## 2019-01-20 DIAGNOSIS — R55 Syncope and collapse: Secondary | ICD-10-CM

## 2019-01-21 DIAGNOSIS — R55 Syncope and collapse: Secondary | ICD-10-CM

## 2019-01-21 DIAGNOSIS — G40309 Generalized idiopathic epilepsy and epileptic syndromes, not intractable, without status epilepticus: Secondary | ICD-10-CM | POA: Diagnosis not present

## 2019-01-21 DIAGNOSIS — F445 Conversion disorder with seizures or convulsions: Secondary | ICD-10-CM

## 2019-01-21 DIAGNOSIS — G40909 Epilepsy, unspecified, not intractable, without status epilepticus: Secondary | ICD-10-CM

## 2019-01-22 DIAGNOSIS — G40309 Generalized idiopathic epilepsy and epileptic syndromes, not intractable, without status epilepticus: Secondary | ICD-10-CM | POA: Diagnosis not present

## 2019-02-09 ENCOUNTER — Ambulatory Visit (INDEPENDENT_AMBULATORY_CARE_PROVIDER_SITE_OTHER): Payer: Medicaid Other | Admitting: Family

## 2019-02-09 ENCOUNTER — Encounter (INDEPENDENT_AMBULATORY_CARE_PROVIDER_SITE_OTHER): Payer: Self-pay | Admitting: Family

## 2019-02-09 VITALS — BP 120/70 | HR 72 | Ht 60.0 in | Wt 144.8 lb

## 2019-02-09 DIAGNOSIS — Z72820 Sleep deprivation: Secondary | ICD-10-CM | POA: Diagnosis not present

## 2019-02-09 DIAGNOSIS — G40909 Epilepsy, unspecified, not intractable, without status epilepticus: Secondary | ICD-10-CM

## 2019-02-09 DIAGNOSIS — F445 Conversion disorder with seizures or convulsions: Secondary | ICD-10-CM

## 2019-02-09 DIAGNOSIS — F410 Panic disorder [episodic paroxysmal anxiety] without agoraphobia: Secondary | ICD-10-CM

## 2019-02-09 MED ORDER — SERTRALINE HCL 25 MG PO TABS: ORAL_TABLET | ORAL | 1 refills | Status: DC

## 2019-02-09 NOTE — Progress Notes (Signed)
Patient: Melinda Calderon MRN: [id number] Sex: female DOB: 01-08-2001  Provider: Elveria Rising, NP Location of Care: [facility]  Note type: Routine return visit  History of Present Illness: Referral Source: Marge Duncans, MD History from: mother, patient and [facility] chart Chief Complaint: Seizures  DAHNA ELLISOR is a 18 y.o. girl with history of generalized convulsive seizures, non-epileptic events, and panic disorder. She was last seen January 06, 2019. Melinda Calderon is taking and tolerating Lamotrigine, Levetiracetam and Fycompa for her seizure disorder but continues to report daily seizure episodes. She and her mother say that she typically has 5 or 6 episodes per day.  Mom says that the seizures are convulsive and last 15 minutes at times. It is not clear to me if these are epileptic or non-epileptic events. Melinda Calderon says that she urinates on herself with most seizures and occasionally bites her tongue. She tells me that she is compliant with medication.   Melinda Calderon also has a significant panic disorder and admits to feeling anxiety and panic every day. She has been taking Paroxetine but does not feel that it is helpful. I have instructed her on multiple occasions to get established with mental health services but she has not yet done so.   Melinda Calderon has longstanding problems with sleep. She tends to be awake all night and sleep during the day. I have given her information on sleep hygiene and she says that she tries to follow the recommendations but is unable to sleep at night.   Melinda Calderon has graduated from [employer] and wants to attend [employer] if her medical conditions can be in better control. She complains today of chest pains as well as generalized pains "all over her body". She has had no known trauma or trigger for these pains. She says that when she has a seizure she is usually lying down and has not fallen. Melinda Calderon has been otherwise generally healthy since she was  last seen. She and her mother are interested in the results of the 48 hour EEG done in February but those results are not yet available. I have asked her on previous occasions to get an EKG done because of potential for prolonged QT interval with some medications that we may consider for her but she has not gotten the study done yet for reasons that are unclear to me.   Neither Melinda Calderon nor her mother have other health concerns for her today other than previously mentioned.  Review of Systems: Please see the HPI for neurologic and other pertinent review of systems. Otherwise, all other systems were reviewed and were negative.    Past Medical History:  Diagnosis Date  . Anxiety   . Seizures (HCC)    Hospitalizations: No., Head Injury: No., Nervous System Infections: No., Immunizations up to date: Yes.   Past Medical History Comments: See HPI Copied from previous record: There have been problems with multiple missed appointments, difficulties obtaining clear history and concerns about possible non-epileptic events.She had an admission to [facility] in January 2018 for intentional overdose.  EEG 01/22/16 was normal awake, asleep and drowsy. Behaviors and complaints had no EEG correlates.  Surgical History History reviewed. No pertinent surgical history.  Family History family history includes Cancer in her maternal grandmother and paternal grandmother; Cirrhosis in her maternal grandfather; Epilepsy in her maternal grandfather and paternal aunt; Hypertension in her mother; Seizures in her paternal grandfather. Family History is otherwise negative for migraines, seizures, cognitive impairment, blindness, deafness, birth defects, chromosomal disorder,  autism.  Social History Social History   Socioeconomic History  . Marital status: Single    Spouse name: Not on file  . Number of children: Not on file  . Years of education: Not on file  . Highest education level: Not on file   Occupational History  . Not on file  Social Needs  . Financial resource strain: Not on file  . Food insecurity:    Worry: Not on file    Inability: Not on file  . Transportation needs:    Medical: Not on file    Non-medical: Not on file  Tobacco Use  . Smoking status: Passive Smoke Exposure - Never Smoker  . Smokeless tobacco: Never Used  Substance and Sexual Activity  . Alcohol use: No    Alcohol/week: 0.0 standard drinks  . Drug use: No  . Sexual activity: Never  Lifestyle  . Physical activity:    Days per week: Not on file    Minutes per session: Not on file  . Stress: Not on file  Relationships  . Social connections:    Talks on phone: Not on file    Gets together: Not on file    Attends religious service: Not on file    Active member of club or organization: Not on file    Attends meetings of clubs or organizations: Not on file    Relationship status: Not on file  Other Topics Concern  . Not on file  Social History Narrative   Skylur recently graduated HS; she will be attending [employer]>   She lives with her parents and sibling.    She enjoys watching TV and being on her phone.    Allergies No Known Allergies  Physical Exam BP 120/70   Pulse 72   Ht 5' (1.524 m)   Wt 144 lb 12.8 oz (65.7 kg)   BMI 28.28 kg/m  General: Well developed, well nourished young woman, seated on exam table, in no evident distress, dyed red/black hair, brown eyes, right handed Head: Head normocephalic and atraumatic.  Oropharynx benign. Neck: Supple with no carotid bruits Cardiovascular: Regular rate and rhythm, no murmurs Respiratory: Breath sounds clear to auscultation Musculoskeletal: No obvious deformities or scoliosis Skin: No rashes or neurocutaneous lesions  Neurologic Exam Mental Status: Awake and fully alert.  Oriented to place and time.  Recent and remote memory intact.  Attention span, concentration, and fund of knowledge appropriate.  Mood and affect appropriate.  Cranial Nerves: Fundoscopic exam reveals sharp disc margins.  Pupils equal, briskly reactive to light.  Extraocular movements full without nystagmus.  Visual fields full to confrontation.  Hearing intact and symmetric to finger rub.  Facial sensation intact.  Face tongue, palate move normally and symmetrically.  Neck flexion and extension normal. Motor: Normal bulk and tone. Normal strength in all tested extremity muscles. Sensory: Intact to touch and temperature in all extremities.  Coordination: Rapid alternating movements normal in all extremities.  Finger-to-nose and heel-to shin performed accurately bilaterally.  Romberg negative. Gait and Station: Arises from chair without difficulty.  Stance is normal. Gait demonstrates normal stride length and balance.   Able to heel, toe and tandem walk without difficulty. Reflexes: 1+ and symmetric. Toes downgoing.  Impression 1.  Generalized convulsive seizures 2.  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 Panic disorder 4. Disordered sleep 5. Urinary incontinence 6. History of overdose and admission to [facility] in January 2018  Recommendations for plan of care The patient's previous [facility] records were reviewed. Melinda Calderon has  neither had nor required imaging or lab studies since the last visit, other than a 48 hour EEG and those results are pending. 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 She is taking and tolerating Lamotrigine, Levetiracetam and Fycompa for her seizure disorder but continues to report frequent daily seizures. The 48 hour EEG report is pending and I will call Melinda Calderon and her mother when it is available. She will continue her medication without change for now. She continues to have daily anxiety and panic, and does not feel that the Paroxetine has been helpful. I recommended changing to Sertraline and gave her instructions  on how to do that. I again asked her to get established with mental health services. I also talked with her again about sleep hygiene measures and encouraged her to work on getting better sleep. I will see her back in follow up in 1 month or sooner if needed. Melinda Calderon and her mother agreed with the plans made today.   The medication list was reviewed and reconciled.  I reviewed changes that were made in the prescribed medications today.  A complete medication list was provided to the patient.   Allergies as of 02/09/2019   No Known Allergies     Medication List       Accurate as of February 09, 2019 11:59 PM. Always use your most recent med list.        cetirizine 10 MG tablet Commonly known as:  ZYRTEC Take 10 mg by mouth daily as needed for allergies.   dicyclomine 20 MG tablet Commonly known as:  BENTYL Take 1 tablet (20 mg total) by mouth 2 (two) times daily as needed (Abdominal cramping).   fluticasone 50 MCG/ACT nasal spray Commonly known as:  FLONASE Place 2 sprays into both nostrils daily as needed for allergies or rhinitis.   Fycompa 4 MG Tabs Generic drug:  Perampanel Take 1 tablet at bedtime   ibuprofen 200 MG tablet Commonly known as:  ADVIL,MOTRIN Take 400 mg by mouth daily as needed for moderate pain.   lamoTRIgine 100 MG tablet Commonly known as:  LAMICTAL Take 2 tablets by mouth twice per day   levETIRAcetam 500 MG tablet Commonly known as:  KEPPRA Take 2 tablets twice per day   omeprazole 20 MG capsule Commonly known as:  PRILOSEC Take 1 capsule (20 mg total) by mouth daily.   ondansetron 4 MG disintegrating tablet Commonly known as:  Zofran ODT Take 1 tablet (4 mg total) by mouth every 8 (eight) hours as needed for nausea or vomiting.   ondansetron 4 MG tablet Commonly known as:  ZOFRAN Take 1 tablet (4 mg total) by mouth every 8 (eight) hours as needed for nausea or vomiting.   promethazine 25 MG tablet Commonly known as:  PHENERGAN Take 1  tablet (25 mg total) by mouth every 6 (six) hours as needed for nausea or vomiting.   sertraline 25 MG tablet Commonly known as:  ZOLOFT Take 1 tablet daily       Total time spent with the patient was 30 minutes, of which 50% or more was spent in counseling and coordination of care.   Elveria Rising NP-C

## 2019-02-09 NOTE — Patient Instructions (Signed)
Thank you for coming in today.   Instructions for you until your next appointment are as follows: 1. Stop taking Paroxetine (Paxil) 2. After 2 days, start taking Sertraline once per day. This is a different medication for panic and anxiety. 3. Do not take both the Paroxetine and the Sertraline 4. The test that you did at home has not been read. I will check with Dr Sharene Skeans about that and call you in a few days.  5. Continue doing the things that I have asked to help you to sleep better at night. The new medication may help that as well.  6. It is very important that you get an appointment with a psychiatrist. Call tomorrow and see when you can get an appointment.  7. Please plan to return for follow up in 1 month or sooner if needed.

## 2019-02-11 ENCOUNTER — Encounter (INDEPENDENT_AMBULATORY_CARE_PROVIDER_SITE_OTHER): Payer: Self-pay | Admitting: Family

## 2019-02-11 IMAGING — US US ABDOMEN LIMITED
1 series · 14 of 25 positions shown · non-contrast
Comparison: None.

CLINICAL DATA: Nausea and vomiting for 1 week.

EXAM:
ULTRASOUND ABDOMEN LIMITED RIGHT UPPER QUADRANT

[Series 1: us abdomen limited · 0.17mm/px · 14 of 70 slices shown]
[im 1/70]
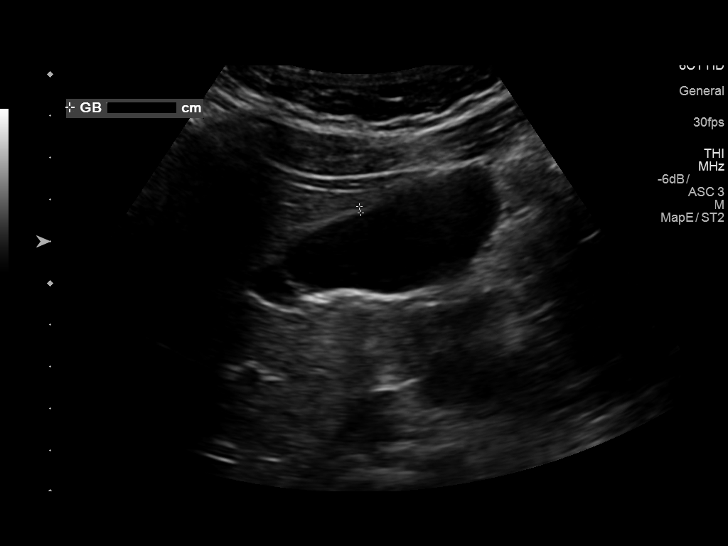
[im 6/70]
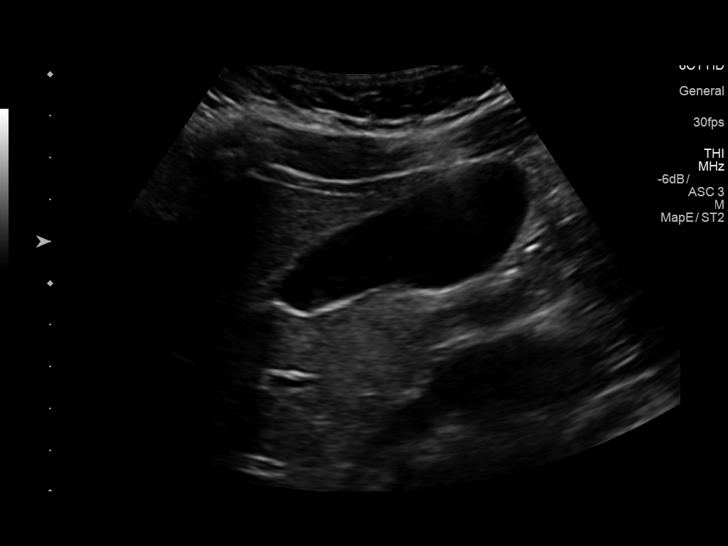
[im 12/70]
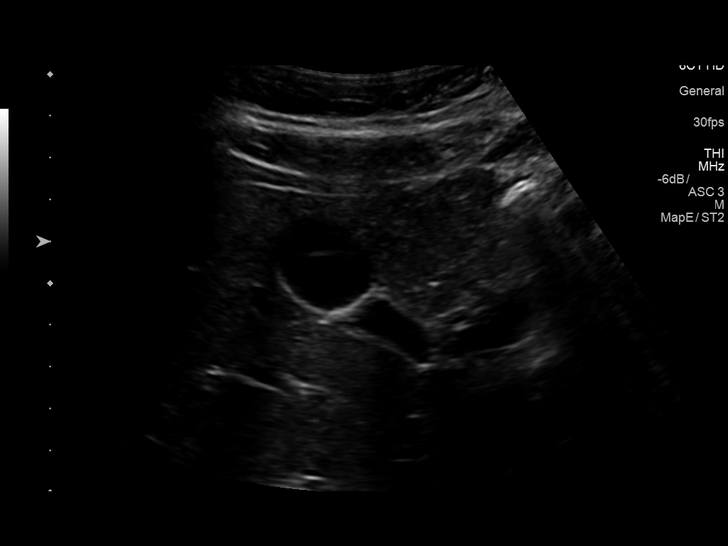
[im 18/70]
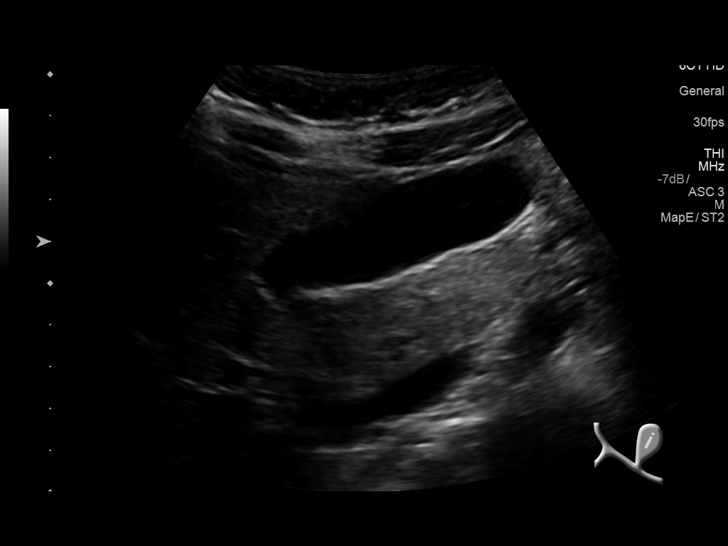
[im 24/70]
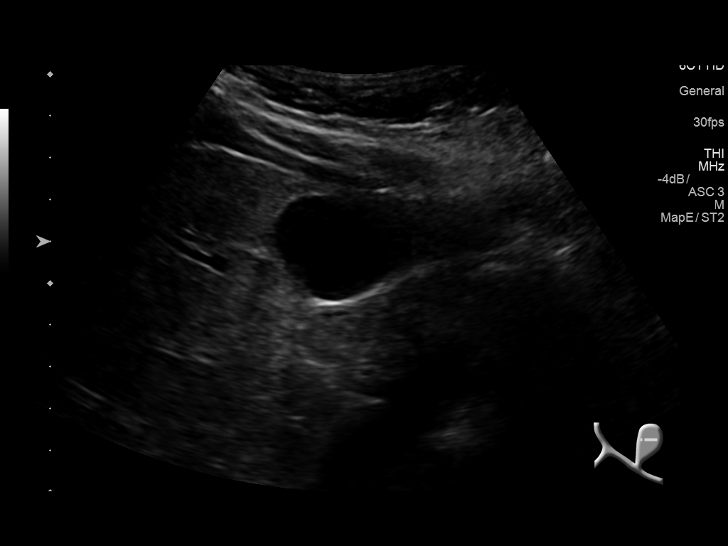
[im 26/70]
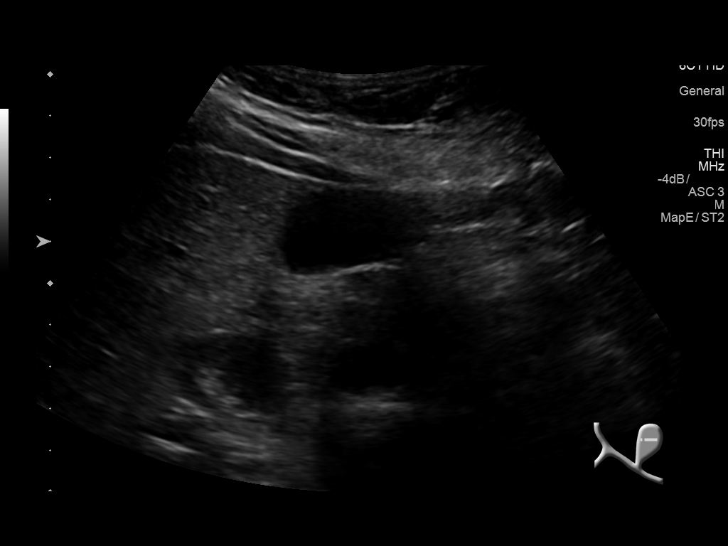
[im 32/70]
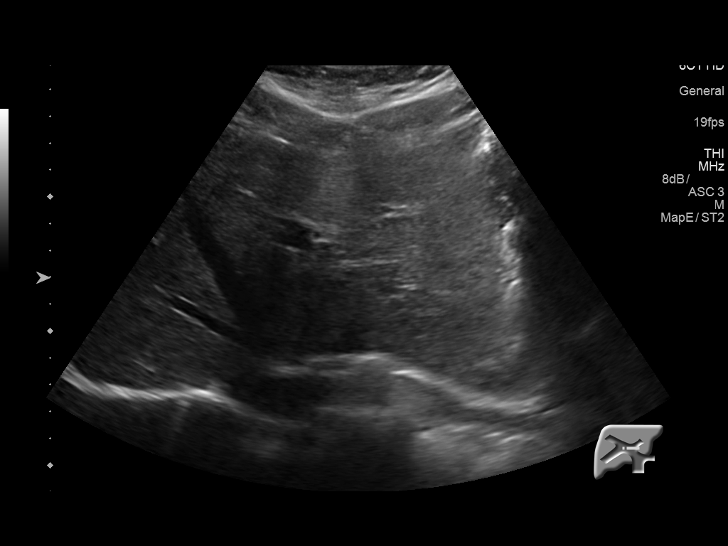
[im 38/70]
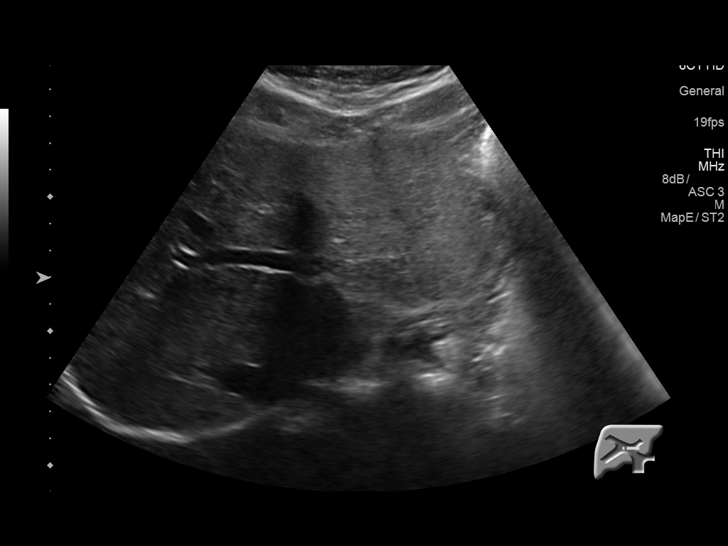
[im 44/70]
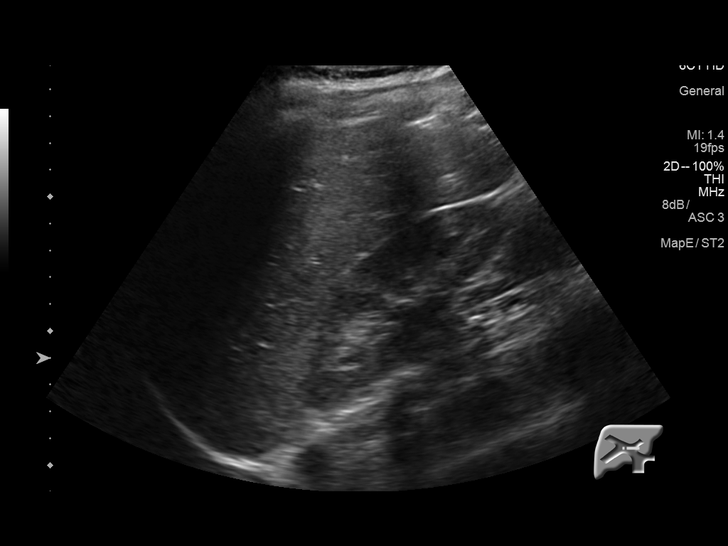
[im 47/70]
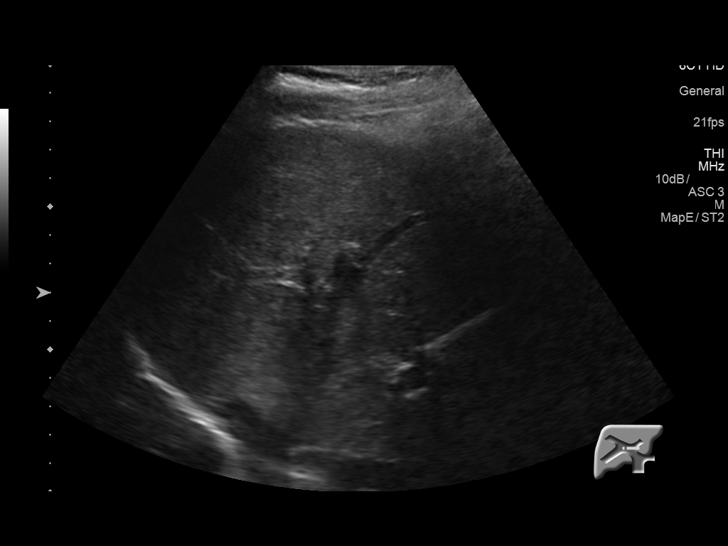
[im 52/70]
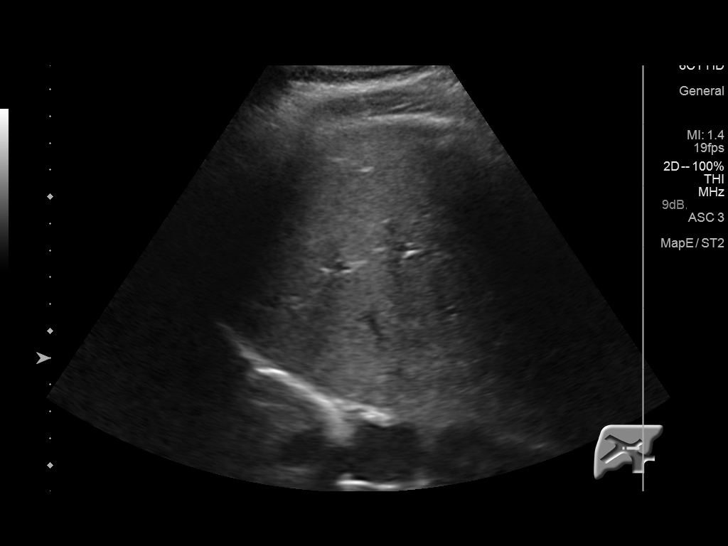
[im 58/70]
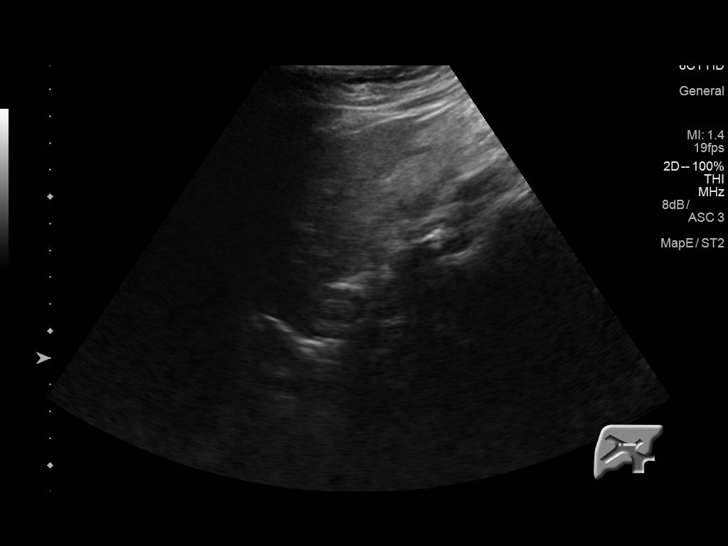
[im 64/70]
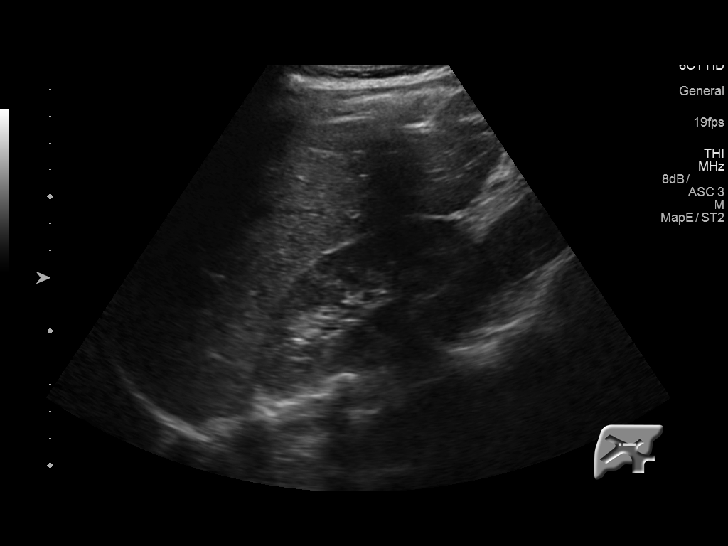
[im 70/70]
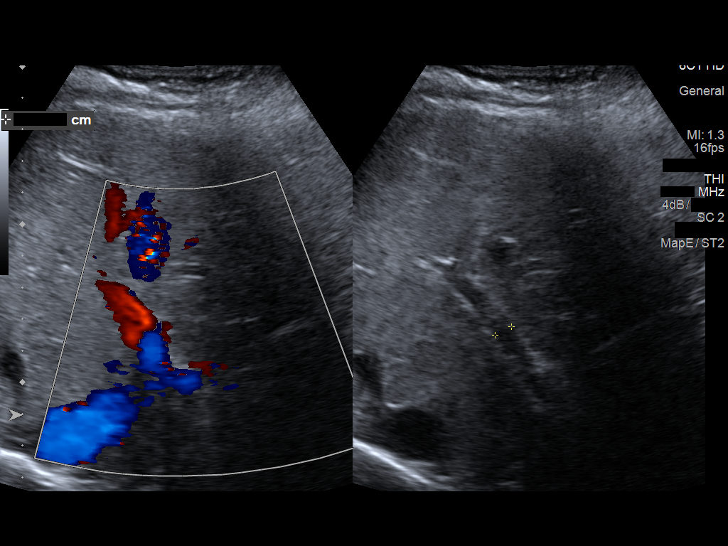

[14 of 25 positions shown; findings below may reference images not displayed]

FINDINGS: Gallbladder:

No gallstones or wall thickening visualized. No sonographic Murphy
sign noted by sonographer.

Common bile duct:

Diameter: 2 mm

Liver:

No focal lesion identified. Within normal limits in parenchymal
echogenicity. Portal vein is patent on color Doppler imaging with
normal direction of blood flow towards the liver.
IMPRESSION: Normal exam.

## 2019-03-16 ENCOUNTER — Ambulatory Visit (INDEPENDENT_AMBULATORY_CARE_PROVIDER_SITE_OTHER): Payer: Medicaid Other | Admitting: Family

## 2019-03-16 ENCOUNTER — Encounter (INDEPENDENT_AMBULATORY_CARE_PROVIDER_SITE_OTHER): Payer: Self-pay | Admitting: Family

## 2019-03-22 ENCOUNTER — Ambulatory Visit (INDEPENDENT_AMBULATORY_CARE_PROVIDER_SITE_OTHER): Payer: Medicaid Other | Admitting: Family

## 2019-05-24 DIAGNOSIS — Z0279 Encounter for issue of other medical certificate: Secondary | ICD-10-CM

## 2019-06-11 ENCOUNTER — Emergency Department (HOSPITAL_COMMUNITY)
Admission: EM | Admit: 2019-06-11 | Discharge: 2019-06-11 | Disposition: A | Discharge status: Home / Self Care | Payer: Medicaid Other | Attending: Emergency Medicine | Admitting: Emergency Medicine

## 2019-06-11 ENCOUNTER — Emergency Department (HOSPITAL_COMMUNITY): Payer: Medicaid Other

## 2019-06-11 ENCOUNTER — Other Ambulatory Visit: Payer: Self-pay

## 2019-06-11 ENCOUNTER — Encounter (HOSPITAL_COMMUNITY): Payer: Self-pay | Admitting: Emergency Medicine

## 2019-06-11 DIAGNOSIS — Z79899 Other long term (current) drug therapy: Secondary | ICD-10-CM | POA: Diagnosis not present

## 2019-06-11 DIAGNOSIS — R1111 Vomiting without nausea: Secondary | ICD-10-CM | POA: Insufficient documentation

## 2019-06-11 DIAGNOSIS — Z7722 Contact with and (suspected) exposure to environmental tobacco smoke (acute) (chronic): Secondary | ICD-10-CM | POA: Diagnosis not present

## 2019-06-11 DIAGNOSIS — Z3202 Encounter for pregnancy test, result negative: Secondary | ICD-10-CM | POA: Diagnosis not present

## 2019-06-11 LAB — COMPREHENSIVE METABOLIC PANEL
ALT: 24 U/L (ref 0–44)
AST: 31 U/L (ref 15–41)
Albumin: 5.1 g/dL — ABNORMAL HIGH (ref 3.5–5.0)
Alkaline Phosphatase: 61 U/L (ref 38–126)
Anion gap: 18 — ABNORMAL HIGH (ref 5–15)
BUN: 12 mg/dL (ref 6–20)
CO2: 19 mmol/L — ABNORMAL LOW (ref 22–32)
Calcium: 10 mg/dL (ref 8.9–10.3)
Chloride: 103 mmol/L (ref 98–111)
Creatinine, Ser: 0.67 mg/dL (ref 0.44–1.00)
GFR calc Af Amer: >60 mL/min (ref >60)
GFR calc non Af Amer: >60 mL/min (ref >60)
Glucose, Bld: 131 mg/dL — ABNORMAL HIGH (ref 70–99)
Potassium: 4 mmol/L (ref 3.5–5.1)
Sodium: 140 mmol/L (ref 135–145)
Total Bilirubin: 0.6 mg/dL (ref 0.3–1.2)
Total Protein: 7.9 g/dL (ref 6.5–8.1)

## 2019-06-11 LAB — CBC WITH DIFFERENTIAL/PLATELET
Abs Immature Granulocytes: 0.07 10*3/uL (ref 0.00–0.07)
Basophils Absolute: 0 10*3/uL (ref 0.0–0.1)
Basophils Relative: 0 %
Eosinophils Absolute: 0.5 10*3/uL (ref 0.0–0.5)
Eosinophils Relative: 3 %
HCT: 44 % (ref 36.0–46.0)
Hemoglobin: 14.4 g/dL (ref 12.0–15.0)
Immature Granulocytes: 0 %
Lymphocytes Relative: 8 %
Lymphs Abs: 1.4 10*3/uL (ref 0.7–4.0)
MCH: 30.4 pg (ref 26.0–34.0)
MCHC: 32.7 g/dL (ref 30.0–36.0)
MCV: 93 fL (ref 80.0–100.0)
Monocytes Absolute: 0.2 10*3/uL (ref 0.1–1.0)
Monocytes Relative: 1 %
Neutro Abs: 15.5 10*3/uL — ABNORMAL HIGH (ref 1.7–7.7)
Neutrophils Relative %: 88 %
Platelets: 350 10*3/uL (ref 150–400)
RBC: 4.73 MIL/uL (ref 3.87–5.11)
RDW: 13.4 % (ref 11.5–15.5)
WBC: 17.7 10*3/uL — ABNORMAL HIGH (ref 4.0–10.5)
nRBC: 0 % (ref 0.0–0.2)

## 2019-06-11 LAB — I-STAT BETA HCG BLOOD, ED (MC, WL, AP ONLY): I-stat hCG, quantitative: <5 m[IU]/mL (ref <5)

## 2019-06-11 LAB — LIPASE, BLOOD: Lipase: 22 U/L (ref 11–51)

## 2019-06-11 MED ORDER — SODIUM CHLORIDE 0.9 % IV BOLUS
1000.0000 mL | Freq: Once | INTRAVENOUS | Status: AC
  Administered 2019-06-11: 1000 mL via INTRAVENOUS

## 2019-06-11 MED ORDER — PANTOPRAZOLE SODIUM 20 MG PO TBEC: 20.0000 mg | DELAYED_RELEASE_TABLET | Freq: Every day | ORAL | 0 refills | Status: DC

## 2019-06-11 MED ORDER — ONDANSETRON HCL 4 MG/2ML IJ SOLN
4.0000 mg | Freq: Once | INTRAMUSCULAR | Status: AC
  Administered 2019-06-11: 4 mg via INTRAVENOUS
  Filled 2019-06-11: qty 2

## 2019-06-11 MED ORDER — HALOPERIDOL LACTATE 5 MG/ML IJ SOLN
2.0000 mg | Freq: Once | INTRAMUSCULAR | Status: AC
  Administered 2019-06-11: 2 mg via INTRAVENOUS
  Filled 2019-06-11: qty 1

## 2019-06-11 MED ORDER — ONDANSETRON HCL 4 MG PO TABS: 4.0000 mg | ORAL_TABLET | Freq: Three times a day (TID) | ORAL | 0 refills | Status: DC | PRN

## 2019-06-11 NOTE — ED Notes (Signed)
ED Provider at bedside. 

## 2019-06-11 NOTE — ED Provider Notes (Signed)
[facility] DEPT Provider Note   CSN: [id number] Arrival date & time: 06/11/19  1603    History   Chief Complaint Chief Complaint  Patient presents with  . Emesis    HPI Melinda Calderon is a 18 y.o. female.     The history is provided by the patient.  Emesis Severity:  Mild Timing:  Intermittent Able to tolerate:  Liquids Progression:  Unchanged Chronicity:  New Relieved by:  Nothing Worsened by:  Nothing Associated symptoms: no abdominal pain, no arthralgias, no chills, no cough, no diarrhea, no fever and no sore throat   Risk factors: alcohol use     Past Medical History:  Diagnosis Date  . Anxiety   . Seizures [facility])     Patient Active Problem List   Diagnosis Date Noted  . 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 Panic disorder 10/23/2015  . Difficulty controlling anger 09/13/2015  . Dysphonia 09/12/2015  . Episodic tension-type headache, not intractable 02/15/2015  . Syncope and collapse 02/15/2015  . Migraine without aura 02/15/2015  . Lack of adequate sleep 02/15/2015  . Generalized convulsive epilepsy ([place]) 02/22/2014  . Encounter for long-term (current) use of other medications 02/22/2014  . Seizure disorder ([place]) 12/27/2011  . Seizure ([place]) 12/27/2011  . Headache(784.0) 12/27/2011    History reviewed. No pertinent surgical history.   OB History   No obstetric history on file.      Home Medications    Prior to Admission medications   Medication Sig Start Date End Date Taking? Authorizing Provider  cetirizine (ZYRTEC) 10 MG tablet Take 10 mg by mouth daily as needed for allergies.  01/26/18   [provider]  dicyclomine (BENTYL) 20 MG tablet Take 1 tablet (20 mg total) by mouth 2 (two) times daily as needed (Abdominal cramping). Patient not taking: Reported on 02/09/2019 09/03/18   Ward, Ozella Almond, PA-C  fluticasone [facility]) 50 MCG/ACT nasal spray  Place 2 sprays into both nostrils daily as needed for allergies or rhinitis.  01/26/18   [provider]  FYCOMPA 4 MG TABS Take 1 tablet at bedtime 01/06/19   Rockwell Germany, NP  ibuprofen (ADVIL,MOTRIN) 200 MG tablet Take 400 mg by mouth daily as needed for moderate pain.     [provider]  lamoTRIgine (LAMICTAL) 100 MG tablet Take 2 tablets by mouth twice per day 11/30/18   Rockwell Germany, NP  levETIRAcetam (KEPPRA) 500 MG tablet Take 2 tablets twice per day 11/30/18   Rockwell Germany, NP  omeprazole (PRILOSEC) 20 MG capsule Take 1 capsule (20 mg total) by mouth daily. 04/26/18   Maczis, Barth Kirks, PA-C  ondansetron (ZOFRAN ODT) 4 MG disintegrating tablet Take 1 tablet (4 mg total) by mouth every 8 (eight) hours as needed for nausea or vomiting. 03/04/18   Little, Wenda Overland, MD  ondansetron (ZOFRAN) 4 MG tablet Take 1 tablet (4 mg total) by mouth every 8 (eight) hours as needed for up to 12 doses for nausea or vomiting. 06/11/19   Alishea Beaudin, DO  pantoprazole (PROTONIX) 20 MG tablet Take 1 tablet (20 mg total) by mouth daily. 06/11/19 07/11/19  Agustine Rossitto, DO  promethazine (PHENERGAN) 25 MG tablet Take 1 tablet (25 mg total) by mouth every 6 (six) hours as needed for nausea or vomiting. 10/30/18   Kirichenko, Lahoma Rocker, PA-C  sertraline (ZOLOFT) 25 MG tablet Take 1 tablet daily 02/09/19   Rockwell Germany, NP    Family History Family  History  Problem Relation Age of Onset  . Hypertension Mother   . Cancer Maternal Grandmother        Died at [phone]  . Cancer Paternal Grandmother        Died at [phone]  . Epilepsy Maternal Grandfather   . Cirrhosis Maternal Grandfather        Died at [phone]  . Seizures Paternal Grandfather        Died at [phone]  . Epilepsy Paternal Aunt     Social History Social History   Tobacco Use  . Smoking status: Passive Smoke Exposure - Never Smoker  . Smokeless tobacco: Never Used  Substance Use Topics  . Alcohol use: No    Alcohol/week: 0.0  standard drinks  . Drug use: No     Allergies   Patient has no known allergies.   Review of Systems Review of Systems  Constitutional: Negative for chills and fever.  HENT: Negative for ear pain and sore throat.   Eyes: Negative for pain and visual disturbance.  Respiratory: Negative for cough and shortness of breath.   Cardiovascular: Negative for chest pain and palpitations.  Gastrointestinal: Positive for vomiting. Negative for abdominal pain and diarrhea.  Genitourinary: Negative for dysuria and hematuria.  Musculoskeletal: Negative for arthralgias and back pain.  Skin: Negative for color change and rash.  Neurological: Negative for seizures and syncope.  All other systems reviewed and are negative.    Physical Exam Updated Vital Signs  ED Triage Vitals  Enc Vitals Group     BP 06/11/19 1635 (!) 144/92     Pulse Rate 06/11/19 1635 68     Resp 06/11/19 1635 18     Temp 06/11/19 1635 97.8 F (36.6 C)     Temp Source 06/11/19 1635 Oral     SpO2 06/11/19 1635 100 %     Weight --      Height --      Head Circumference --      Peak Flow --      Pain Score 06/11/19 1629 10     Pain Loc --      Pain Edu? --      Excl. in GC? --     Physical Exam Vitals signs and nursing note reviewed.  Constitutional:      General: She is not in acute distress.    Appearance: She is well-developed. She is not ill-appearing.  HENT:     Head: Normocephalic and atraumatic.     Nose: Nose normal.     Mouth/Throat:     Mouth: Mucous membranes are moist.  Eyes:     Extraocular Movements: Extraocular movements intact.     Conjunctiva/sclera: Conjunctivae normal.     Pupils: Pupils are equal, round, and reactive to light.  Neck:     Musculoskeletal: Neck supple.  Cardiovascular:     Rate and Rhythm: Normal rate and regular rhythm.     Pulses: Normal pulses.     Heart sounds: Normal heart sounds. No murmur.  Pulmonary:     Effort: Pulmonary effort is normal. No respiratory  distress.     Breath sounds: Normal breath sounds.  Abdominal:     General: There is no distension.     Palpations: Abdomen is soft.     Tenderness: There is no abdominal tenderness.  Musculoskeletal: Normal range of motion.  Skin:    General: Skin is warm and dry.     Capillary Refill: Capillary refill takes less than  2 seconds.  Neurological:     General: No focal deficit present.     Mental Status: She is alert.      ED Treatments / Results  Labs (all labs ordered are listed, but only abnormal results are displayed) Labs Reviewed  CBC WITH DIFFERENTIAL/PLATELET - Abnormal; Notable for the following components:      Result Value   WBC 17.7 (*)    Neutro Abs 15.5 (*)    All other components within normal limits  COMPREHENSIVE METABOLIC PANEL - Abnormal; Notable for the following components:   CO2 19 (*)    Glucose, Bld 131 (*)    Albumin 5.1 (*)    Anion gap 18 (*)    All other components within normal limits  LIPASE, BLOOD  URINALYSIS, ROUTINE W REFLEX MICROSCOPIC  I-STAT BETA HCG BLOOD, ED ([facility], [facility], [facility] ONLY)    EKG None  Radiology Dg Chest Portable 1 View  Result Date: 06/11/2019 CLINICAL DATA:  Nausea and vomiting EXAM: PORTABLE CHEST 1 VIEW COMPARISON:  December 13, 2013 FINDINGS: No edema or consolidation. The heart size and pulmonary vascularity are normal. No adenopathy. No bone lesions. IMPRESSION: No edema or consolidation. Electronically Signed   By: Bretta BangWilliam  Woodruff III M.D.   On: 06/11/2019 17:30    Procedures Procedures (including critical care time)  Medications Ordered in ED Medications  sodium chloride 0.9 % bolus 1,000 mL (1,000 mLs Intravenous New Bag/Given (Non-Interop) 06/11/19 1646)  ondansetron (ZOFRAN) injection 4 mg (4 mg Intravenous Given 06/11/19 1646)  haloperidol lactate (HALDOL) injection 2 mg (2 mg Intravenous Given 06/11/19 1709)     Initial Impression / Assessment and Plan / ED Course  I have reviewed the triage vital signs and  the nursing notes.  Pertinent labs & imaging results that were available during my care of the patient were reviewed by me and considered in my medical decision making (see chart for details).     Sharyl Nimrodnderkia S Bunting is a 18 year old female here with nausea, vomiting after drinking liquor last night.  Patient does not have any abdominal tenderness on exam.  Patient with normal vitals.  No fever.  Mild leukocytosis likely from vomiting.  Otherwise lab work is unremarkable.  Gallbladder and liver enzymes within normal limits.  Lipase normal.  Doubt pancreatitis.  Patient felt better after IV fluids, IV Zofran, IV Haldol.  She was able to tolerate p.o.  Likely alcoholic gastritis.  Given prescription for Zofran.  Educated her about using a bland diet.  Given prescription for Protonix.  Discharged in ED in good condition.  Understands return precautions.  Pregnancy test is negative.  H&P not consistent with appendicitis.  This chart was dictated using voice recognition software.  Despite best efforts to proofread,  errors can occur which can change the documentation meaning.    Final Clinical Impressions(s) / ED Diagnoses   Final diagnoses:  Vomiting without nausea, intractability of vomiting not specified, unspecified vomiting type    ED Discharge Orders         Ordered    ondansetron (ZOFRAN) 4 MG tablet  Every 8 hours PRN     06/11/19 1855    pantoprazole (PROTONIX) 20 MG tablet  Daily     06/11/19 1858           Virgina NorfolkCuratolo, Katryn Plummer, DO 06/11/19 1858

## 2019-06-11 NOTE — ED Notes (Signed)
XR at bedside

## 2019-06-11 NOTE — ED Triage Notes (Addendum)
Per EMS, patient reports N/V today after consuming copious amounts of liquor last night.  20g R AC 4mg  Zofran with EMS

## 2019-06-28 ENCOUNTER — Ambulatory Visit (INDEPENDENT_AMBULATORY_CARE_PROVIDER_SITE_OTHER): Payer: Medicaid Other | Admitting: Family

## 2019-06-28 ENCOUNTER — Encounter (INDEPENDENT_AMBULATORY_CARE_PROVIDER_SITE_OTHER): Payer: Self-pay | Admitting: Family

## 2019-06-28 ENCOUNTER — Other Ambulatory Visit: Payer: Self-pay

## 2019-06-28 VITALS — BP 124/86 | HR 130 | Ht 61.42 in | Wt 142.2 lb

## 2019-06-28 DIAGNOSIS — G40309 Generalized idiopathic epilepsy and epileptic syndromes, not intractable, without status epilepticus: Secondary | ICD-10-CM

## 2019-06-28 DIAGNOSIS — F410 Panic disorder [episodic paroxysmal anxiety] without agoraphobia: Secondary | ICD-10-CM

## 2019-06-28 DIAGNOSIS — K219 Gastro-esophageal reflux disease without esophagitis: Secondary | ICD-10-CM | POA: Diagnosis not present

## 2019-06-28 DIAGNOSIS — Z72820 Sleep deprivation: Secondary | ICD-10-CM

## 2019-06-28 DIAGNOSIS — F445 Conversion disorder with seizures or convulsions: Secondary | ICD-10-CM

## 2019-06-28 MED ORDER — LAMOTRIGINE 100 MG PO TABS: ORAL_TABLET | ORAL | 2 refills | Status: DC

## 2019-06-28 MED ORDER — SERTRALINE HCL 50 MG PO TABS: ORAL_TABLET | ORAL | 2 refills | Status: DC

## 2019-06-28 MED ORDER — FYCOMPA 4 MG PO TABS: ORAL_TABLET | ORAL | 2 refills | Status: DC

## 2019-06-28 MED ORDER — PANTOPRAZOLE SODIUM 40 MG PO TBEC: DELAYED_RELEASE_TABLET | ORAL | 2 refills | Status: DC

## 2019-06-28 MED ORDER — LEVETIRACETAM 500 MG PO TABS: ORAL_TABLET | ORAL | 2 refills | Status: DC

## 2019-06-28 NOTE — Patient Instructions (Signed)
Thank you for coming in today.   Instructions for you until your next appointment are as follows: 1. Stop Sertraline 25mg .  2. Start Sertraline 50mg  - one tablet per day. I sent in a new prescription for this dose. This is for panic and anxiety as well as getting easily frustrated 3. Continue taking Fycompa 4mg  - 1 tablet at bedtime, Levetiracetam 500mg  - 2 tablets twice per day and Lamotrigine 100mg  - 2 tablets twice per day 4. Continue to work on getting established with a psychiatrist for your panic and anxiety.  5. I refilled the reflux medicine (Pantoprazole). Take 1 tablet twice per day. Be sure to watch your diet, avoid fried and spicy foods, and avoid drinking alcohol.  6. Please plan to return for follow up in 1 month or sooner if needed.

## 2019-06-28 NOTE — Progress Notes (Signed)
Melinda Calderon   MRN:  [id number]  03/18/2001   Provider: Elveria Risingina Yaretzy Olazabal NP-C [facility]  Visit type: Routine return visit   Last visit: 02/09/2019  Referral source: Melinda DuncansMelinda Paul, MD [facility], patient and her mother  Brief history:  History of generalized convulsive seizures, non-epileptic events and panic disorder. She is taking and tolerating Lamotrigine, Levetiracetam and Fycompa for seizures. She is taking Sertraline for panic.   Today's concerns: Tyrese and her mother report today that she continues to experience frequent seizures. Mom reports a fairly severe convulsive seizure in her sleep a few days ago. She has reported episodes of urinary incontinence with and without reports of seizure activity. Mom is also concerned because Melinda Calderon is more easily frustrated and gets emotional very easily. She was seen in the ER 2 weeks ago for vomiting and says that she has had chest pain, as well as pain in her arms and legs since then. Melinda Calderon says that she was told that she had GERD in the ER, and complains of ongoing burning pain in the midsternal area. She has longstanding problems with insomnia and tends to be awake all night and sleep sporadically during the day. I have repeatedly instructed Melinda Calderon to get established with a psychiatrist but that has not occurred for reasons unclear to me.   Review of systems: Please see HPI for neurologic and other pertinent review of systems. Otherwise all other systems were reviewed and were negative.  Problem List: Patient Active Problem List   Diagnosis Date Noted  . 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 Panic disorder 10/23/2015  . Difficulty controlling anger 09/13/2015  . Dysphonia 09/12/2015  . Episodic tension-type headache, not intractable 02/15/2015  . Syncope and collapse 02/15/2015  . Migraine without aura 02/15/2015  . Lack  of adequate sleep 02/15/2015  . 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 Encounter for long-term (current) use of other medications 02/22/2014  . Seizure disorder (HCC) 12/27/2011  . Seizure (HCC) 12/27/2011  . Headache(784.0) 12/27/2011     Past Medical History:  Diagnosis Date  . Anxiety   . Seizures (HCC)     Past medical history comments: See HPI Copied from previous record: There have been problems with multiple missed appointments, difficulties obtaining clear history and concerns about possible non-epileptic events.[facility].  EEG 01/22/16 was normal awake, asleep and drowsy. Behaviors and complaints had no EEG correlates.  Surgical history: No past surgical history on file.   Family history: family history includes Cancer in her maternal grandmother and paternal grandmother; Cirrhosis in her maternal grandfather; Epilepsy in her maternal grandfather and paternal aunt; Hypertension in her mother; Seizures in her paternal grandfather.   Social history: Social History   Socioeconomic History  . Marital status: Single    Spouse name: Not on file  . Number of children: Not on file  . Years of education: Not on file  . Highest education level: Not on file  Occupational History  . Not on file  Social Needs  . Financial resource strain: Not on file  . Food insecurity    Worry: Not on file    Inability: Not on file  . Transportation needs    Medical: Not on file    Non-medical: Not on file  Tobacco Use  . Smoking status: Passive Smoke Exposure - Never Smoker  . Smokeless  tobacco: Never Used  Substance and Sexual Activity  . Alcohol use: No    Alcohol/week: 0.0 standard drinks  . Drug use: No  . Sexual activity: Never  Lifestyle  . Physical activity    Days per week: Not on file    Minutes per session: Not on file  . Stress: Not on file  Relationships  . Social Wellsite geologistconnections     Talks on phone: Not on file    Gets together: Not on file    Attends religious service: Not on file    Active member of club or organization: Not on file    Attends meetings of clubs or organizations: Not on file    Relationship status: Not on file  . Intimate partner violence    Fear of current or ex partner: Not on file    Emotionally abused: Not on file    Physically abused: Not on file    Forced sexual activity: Not on file  Other Topics Concern  . Not on file  Social History Narrative   Melinda Calderon recently graduated HS; she will be attending [employer]>   She lives with her parents and sibling.    She enjoys watching TV and being on her phone.    Past/failed meds:   Allergies: No Known Allergies    Immunizations:  There is no immunization history on file for this patient.    Diagnostics/Screenings: EEG 01/22/16 was normal awake, asleep and drowsy. Behaviors and complaints had no EEG correlates.  Physical Exam: BP 124/86   Pulse (!) 130   Ht 5' 1.42" (1.56 m)   Wt 142 lb 3.2 oz (64.5 kg)   LMP 06/28/2019 (Exact Date)   BMI 26.50 kg/m   General: Well developed, well nourished, seated, in no evident distress, black hair, brown eyes, right handed Head: Head normocephalic and atraumatic.  Oropharynx benign. Neck: Supple with no carotid bruits Cardiovascular: Regular rate and rhythm, no murmurs Respiratory: Breath sounds clear to auscultation Musculoskeletal: No obvious deformities or scoliosis Skin: No rashes or neurocutaneous lesions  Neurologic Exam Mental Status: Awake and fully alert.  Oriented to place and time.  Recent and remote memory intact.  Attention span, concentration, and fund of knowledge appropriate.  Mood and affect appropriate. Cranial Nerves: Fundoscopic exam reveals sharp disc margins.  Pupils equal, briskly reactive to light.  Extraocular movements full without nystagmus.  Visual fields full to confrontation.  Hearing intact and symmetric to finger rub.   Facial sensation intact.  Face tongue, palate move normally and symmetrically.  Neck flexion and extension normal. Motor: Normal bulk and tone. Normal strength in all tested extremity muscles. Sensory: Intact to touch and temperature in all extremities.  Coordination: Rapid alternating movements normal in all extremities.  Finger-to-nose and heel-to shin performed accurately bilaterally.  Romberg negative. Gait and Station: Arises from chair without difficulty.  Stance is normal. Gait demonstrates normal stride length and balance.   Able to heel, toe and tandem walk without difficulty. Reflexes: 1+ and symmetric. Toes downgoing.  Impression: 1. Generalized convulsive seizures 2. 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 Panic disorder 4. Disordered sleep 5. Complaints of generalized pain 6. History of overdose and [facility]  Recommendations for plan of care: [facility]. Melinda Calderon has neither had nor required imaging or lab studies since the last visit other than what was performed in the ER 2 weeks ago. She is a 18 year old woman with history of generalized convulsive  seizures as well as non-epileptic events. She has longstanding problems with panic and disordered sleep. She has complaints of chest and extremity pain today. I talked with her about her chest pain and will refill the Pantoprazole for that. I am concerned that Beverlyann is not compliant with taking her medications and will plan to draw random antiepileptic drug levels at her next visit as the lab technician is not available in the office today. I recommended increase in Sertraline dose to help with panic. I talked at some length with Shannelle and her mother about getting established with psychiatrist for her problems with mood as well as non-epileptic events. I will see Torunn back in follow up in 1 month or sooner if needed. She and her mother agreed with the plans  made today.   The medication list was reviewed and reconciled. I reviewed changes that were made in the prescribed medications today. A complete medication list was provided to the patient.  Allergies as of 06/28/2019   No Known Allergies     Medication List       Accurate as of June 28, 2019 11:59 PM. If you have any questions, ask your nurse or doctor.        cetirizine 10 MG tablet Commonly known as: ZYRTEC Take 10 mg by mouth daily as needed for allergies.   dicyclomine 20 MG tablet Commonly known as: BENTYL Take 1 tablet (20 mg total) by mouth 2 (two) times daily as needed (Abdominal cramping).   fluticasone 50 MCG/ACT nasal spray Commonly known as: FLONASE Place 2 sprays into both nostrils daily as needed for allergies or rhinitis.   Fycompa 4 MG Tabs Generic drug: Perampanel Take 1 tablet at bedtime   ibuprofen 200 MG tablet Commonly known as: ADVIL Take 400 mg by mouth daily as needed for moderate pain.   lamoTRIgine 100 MG tablet Commonly known as: LAMICTAL Take 2 tablets by mouth twice per day   levETIRAcetam 500 MG tablet Commonly known as: KEPPRA Take 2 tablets twice per day   omeprazole 20 MG capsule Commonly known as: PRILOSEC Take 1 capsule (20 mg total) by mouth daily.   ondansetron 4 MG disintegrating tablet Commonly known as: Zofran ODT Take 1 tablet (4 mg total) by mouth every 8 (eight) hours as needed for nausea or vomiting.   ondansetron 4 MG tablet Commonly known as: ZOFRAN Take 1 tablet (4 mg total) by mouth every 8 (eight) hours as needed for up to 12 doses for nausea or vomiting.   pantoprazole 40 MG tablet Commonly known as: PROTONIX Take 1 tablet every 12 hours What changed:   medication strength  how much to take  how to take this  when to take this  additional instructions Changed by: Rockwell Germany, NP   promethazine 25 MG tablet Commonly known as: PHENERGAN Take 1 tablet (25 mg total) by mouth every 6 (six) hours  as needed for nausea or vomiting.   sertraline 50 MG tablet Commonly known as: ZOLOFT Take 1 tablet each day What changed:   medication strength  additional instructions Changed by: Rockwell Germany, NP       Total time spent with the patient was 35 minutes, of which 50% or more was spent in counseling and coordination of care.  Rockwell Germany NP-C [facility] Ph. [phone] Fax [phone]

## 2019-07-01 ENCOUNTER — Encounter (INDEPENDENT_AMBULATORY_CARE_PROVIDER_SITE_OTHER): Payer: Self-pay | Admitting: Family

## 2019-07-14 DIAGNOSIS — Z0279 Encounter for issue of other medical certificate: Secondary | ICD-10-CM

## 2019-07-27 ENCOUNTER — Ambulatory Visit (INDEPENDENT_AMBULATORY_CARE_PROVIDER_SITE_OTHER): Payer: Medicaid Other | Admitting: Family

## 2019-07-29 ENCOUNTER — Telehealth (INDEPENDENT_AMBULATORY_CARE_PROVIDER_SITE_OTHER): Payer: Self-pay | Admitting: Family

## 2019-07-29 NOTE — Telephone Encounter (Signed)
  Who's calling (name and relationship to patient) : [employer] in [place] contact number: [phone]  Provider they see: Rockwell Germany  Reason for call: Richmond Campbell from [employer] is calling to inquire about release of information, states they will fax [place] that request today 07/29/19. Once received we can complete the process and fax to [phone].     PRESCRIPTION REFILL ONLY  Name of prescription:  Pharmacy:

## 2019-08-18 ENCOUNTER — Other Ambulatory Visit: Payer: Self-pay

## 2019-08-18 ENCOUNTER — Encounter (INDEPENDENT_AMBULATORY_CARE_PROVIDER_SITE_OTHER): Payer: Self-pay | Admitting: Family

## 2019-08-18 ENCOUNTER — Ambulatory Visit (INDEPENDENT_AMBULATORY_CARE_PROVIDER_SITE_OTHER): Payer: Medicaid Other | Admitting: Family

## 2019-08-18 VITALS — BP 130/82 | HR 80 | Ht 61.42 in | Wt 139.2 lb

## 2019-08-18 DIAGNOSIS — R55 Syncope and collapse: Secondary | ICD-10-CM | POA: Diagnosis not present

## 2019-08-18 DIAGNOSIS — F410 Panic disorder [episodic paroxysmal anxiety] without agoraphobia: Secondary | ICD-10-CM | POA: Diagnosis not present

## 2019-08-18 DIAGNOSIS — Z72820 Sleep deprivation: Secondary | ICD-10-CM

## 2019-08-18 DIAGNOSIS — M2550 Pain in unspecified joint: Secondary | ICD-10-CM

## 2019-08-18 DIAGNOSIS — M545 Low back pain, unspecified: Secondary | ICD-10-CM

## 2019-08-18 DIAGNOSIS — G40309 Generalized idiopathic epilepsy and epileptic syndromes, not intractable, without status epilepticus: Secondary | ICD-10-CM

## 2019-08-18 DIAGNOSIS — Z79899 Other long term (current) drug therapy: Secondary | ICD-10-CM

## 2019-08-18 NOTE — Patient Instructions (Signed)
Thank you for coming in today.   Instructions for you until your next appointment are as follows: 1. I have given you a blood test order. Please get this done at a [place] laboratory within the next week. There is a lab at [facility], [place] in the [employer] or at [address] building on the 2nd floor.  2. I will call you when I receive the blood test results 3. Continue to take your seizure medicines as you have been taking them. Try not to miss any doses.  4. For your Sertraline (the panic medicine), continue to take 1 tablet per day for now.  5. For your sore finger - try alternating heat and ice. You can use a warm washcloth to provide heat. For pain take Tylenol or Ibuprofen.  6. For your body aches - try to exercise some each day. Walking and gentle stretching exercises are best. Exercise is known to help relieve pain and keep muscles strong.  7. Follow up with your primary care provider for your body pain. If you do not have a primary provider, get established with someone.  7. Please plan to return for follow up in 1 months or sooner if needed.

## 2019-08-18 NOTE — Progress Notes (Signed)
Melinda Calderon   MRN:  [id number]  07-20-2001   Provider: Elveria Rising NP-C Location of Care: [facility]  Visit type: Routine visit  Last visit: 06/28/2019  Referral source: Marge Duncans, MD History from: mother, patient, and chcn chart  Brief history:  Copied from previous record History of generalized convulsive seizures, non-epileptic events and panic disorder. She is taking and tolerating Lamotrigine, Levetiracetam and Fycompa for seizures. She is taking Sertraline for panic.   Today's concerns: Melinda Calderon and her mother report today that she has had "6 or 7 seizures" since her last visit. She was last seen on June 28, 2019 and was supposed to return in 4 weeks but did not for reasons unclear to me. They report that one seizure was in the shower, and that she fell backward and struck her head. On that day, she said that the family was having a cookout, and she went to take a shower. Her mother heard a thump and found her lying in the bathroom floor. Mom estimates that it lasted for 20-30 minutes but says that EMS was not called and she was not taken to the ER. Melinda Calderon says that she was not injured other than a bump on her head. Mom reports that some of these seizures occurred 3 times in one day.    Melinda Calderon and her mother report that she has ongoing bouts of anxiety and panic. Mom feels that it happens at least daily. She is taking and tolerating Sertraline and feels that it has been somewhat helpful.   Melinda Calderon has other complaints today of bruises on her arms and legs, pain in her back, pain in her fingers and in her left ring finger in particular. She says that her fingers swell at times and turn red. She complains today that the left ring finger is very painful to touch and she experiences throbbing pain from the finger tip all the way into the wrist for the last month or so. She denies any known trauma. She said that she has tried using a splint on her finger,  applying ice packs and taking Advil and Tylenol without relief. For her back pain, Melinda Calderon complains of relentless low back pain that has been going on "forever". She has tried rest and OTC medications for that. She has no known trauma and says that the back pain was present before her seizure and fall in the bathroom. Her mother asked for a Tramadol prescription for Melinda Calderon's pain and says that her own personal physician recommended that she ask me for it when she expressed concern about Melinda Calderon's ongoing pain.    Melinda Calderon and her mother continue to report that she has problems sleeping and rarely sleeps during the night. She tends to nap on and off during the day. She says that she eats regular meals and drinks water. She is not working or going to school. She has been otherwise generally healthy since she was last seen. Neither she nor her mother have other health concerns for her today other than previously mentioned.   Review of systems: Please see HPI for neurologic and other pertinent review of systems. Otherwise all other systems were reviewed and were negative.  Problem List: Patient Active Problem List   Diagnosis Date Noted  . 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 Panic disorder 10/23/2015  . Difficulty controlling anger 09/13/2015  . Dysphonia 09/12/2015  . Episodic tension-type headache, not intractable 02/15/2015  . Syncope and  collapse 02/15/2015  . Migraine without aura 02/15/2015  . Lack of adequate sleep 02/15/2015  . 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 Encounter for long-term (current) use of other medications 02/22/2014  . Seizure disorder (HCC) 12/27/2011  . Seizure (HCC) 12/27/2011  . Headache(784.0) 12/27/2011     Past Medical History:  Diagnosis Date  . Anxiety   . Seizures (HCC)     Past medical history comments: See HPI Copied from previous record: There have been problems with multiple  missed appointments, difficulties obtaining clear history and concerns about possible non-epileptic events.[facility].  EEG 01/22/16 was normal awake, asleep and drowsy. Behaviors and complaints had no EEG correlates.  Surgical history: No past surgical history on file.   Family history: family history includes Cancer in her maternal grandmother and paternal grandmother; Cirrhosis in her maternal grandfather; Epilepsy in her maternal grandfather and paternal aunt; Hypertension in her mother; Seizures in her paternal grandfather.   Social history: Social History   Socioeconomic History  . Marital status: Single    Spouse name: Not on file  . Number of children: Not on file  . Years of education: Not on file  . Highest education level: Not on file  Occupational History  . Not on file  Social Needs  . Financial resource strain: Not on file  . Food insecurity    Worry: Not on file    Inability: Not on file  . Transportation needs    Medical: Not on file    Non-medical: Not on file  Tobacco Use  . Smoking status: Passive Smoke Exposure - Never Smoker  . Smokeless tobacco: Never Used  . Tobacco comment: family smokes outside  Substance and Sexual Activity  . Alcohol use: No    Alcohol/week: 0.0 standard drinks  . Drug use: No  . Sexual activity: Never  Lifestyle  . Physical activity    Days per week: Not on file    Minutes per session: Not on file  . Stress: Not on file  Relationships  . Social Musicianconnections    Talks on phone: Not on file    Gets together: Not on file    Attends religious service: Not on file    Active member of club or organization: Not on file    Attends meetings of clubs or organizations: Not on file    Relationship status: Not on file  . Intimate partner violence    Fear of current or ex partner: Not on file    Emotionally abused: Not on file    Physically abused: Not on file     Forced sexual activity: Not on file  Other Topics Concern  . Not on file  Social History Narrative   Melinda Calderon recently graduated HS; she will be attending [employer]>   She lives with her parents and sibling.    She enjoys watching TV and being on her phone.      Past/failed meds: Paxil - did not improve anxiety and panic  Allergies: No Known Allergies    Immunizations:  There is no immunization history on file for this patient.    Diagnostics/Screenings: EEG 01/22/16 was normal awake, asleep and drowsy. Behaviors and complaints had no EEG correlates.   Physical Exam: BP 130/82   Pulse 80   Ht 5' 1.42" (1.56 m)   Wt 139 lb 3.2 oz (63.1 kg)   BMI 25.94 kg/m   General: Well developed,  well nourished young woman, seated on exam table, in no evident distress, black hair, brown eyes, right handed Head: Head normocephalic and atraumatic.  Oropharynx benign. Neck: Supple with no carotid bruits Cardiovascular: Regular rate and rhythm, no murmurs Respiratory: Breath sounds clear to auscultation Musculoskeletal: No obvious deformities or scoliosis. Her left ring finger is slightly edematous and red. She reports pain to palpation. She also complained of lower back pain. She had no increase in pain in flexion, extension, rotation or side bending.  Skin: No rashes or neurocutaneous lesions  Neurologic Exam Mental Status: Awake and fully alert.  Oriented to place and time.  Recent and remote memory intact.  Attention span, concentration, and fund of knowledge appropriate.  Mood and affect appropriate. Cranial Nerves: Fundoscopic exam reveals sharp disc margins.  Pupils equal, briskly reactive to light.  Extraocular movements full without nystagmus.  Visual fields full to confrontation.  Hearing intact and symmetric to finger rub.  Facial sensation intact.  Face tongue, palate move normally and symmetrically.  Neck flexion and extension normal. Motor: Normal bulk and tone. Normal strength  in all tested extremity muscles. Sensory: Intact to touch and temperature in all extremities.  Coordination: Rapid alternating movements normal in all extremities.  Finger-to-nose and heel-to shin performed accurately bilaterally.  Romberg negative. Gait and Station: Arises from chair without difficulty.  Stance is normal. Gait demonstrates normal stride length and balance.   Able to heel, toe and tandem walk without difficulty. When I asked her to move from the exam table to walk, she asked for help to get off the table and once standing was leaning forwards, clutching her back, complaining of pain. I encouraged her to straighten to a more anatomical position, and she was able to do do without difficulty. After doing so, she was able to walk normally and perform all aspects of gait testing. When she left the exam room, she got off the table without assistance, stood straight and walked normally.  Reflexes: 1+ and symmetric. Toes downgoing.  Impression: 1. Generalized convulsive seizures 2. Probably non-epileptic events 3. Panic disorder 4. Disordered sleep 5. Complaints of generalized pain, back pain and finger pain 6. [facility]   Recommendations for plan of care: [facility]. Kadedra has neither had nor required imaging or lab studies since the last visit. She is a 18 year old young woman with history of generalized convulsive seizures, probably non-epileptic events, panic disorder, disordered sleep, complaints of pain in her back and in her finger today and history of suicidal attempt with overdose in January 2018. She is taking and tolerating Lamotrigine and Levetiracetam for seizures, and Sertraline for her panic disorder. She continues to report frequent seizures but it is not clear if these are epileptic or non-epileptic events. I am concerned about compliance with medication and gave her a lab  order to get random anticonvulsant drug levels drawn. I also included orders for a CBC and Sed rate because of her complaints of pain. I will call La[facility] with the results when received. I talked with them about Mom's request for Tramadol for Destenie and told her that I would not prescribe it and felt that it was not appropriate for her care. I recommended heat and ice, as well as OTC analgesics if needed. She could try a wrist splint for her finger pain  I talked with her about need for daily exercise and stretching for her complaints  of back and body pain. I talked with Tiffanni and her mother about her disordered sleep and reviewed sleep hygiene measures with her as we have discussed in the past. We talked about her panic and anxiety and I again strongly urged her to get established with a psychiatrist. Finally, I talked with Brynna and her mother about getting established with a PCP to manage ongoing health concerns as well as health maintenance. I will otherwise see her back in 1 month or sooner if needed.   The medication list was reviewed and reconciled. No changes were made in the prescribed medications today. A complete medication list was provided to the patient.  Allergies as of 08/18/2019   No Known Allergies     Medication List       Accurate as of August 18, 2019  2:10 PM. If you have any questions, ask your nurse or doctor.        cetirizine 10 MG tablet Commonly known as: ZYRTEC Take 10 mg by mouth daily as needed for allergies.   dicyclomine 20 MG tablet Commonly known as: BENTYL Take 1 tablet (20 mg total) by mouth 2 (two) times daily as needed (Abdominal cramping).   fluticasone 50 MCG/ACT nasal spray Commonly known as: FLONASE Place 2 sprays into both nostrils daily as needed for allergies or rhinitis.   Fycompa 4 MG Tabs Generic drug: Perampanel Take 1 tablet at bedtime   ibuprofen 200 MG tablet Commonly known as: ADVIL Take 400 mg by mouth daily as needed  for moderate pain.   lamoTRIgine 100 MG tablet Commonly known as: LAMICTAL Take 2 tablets by mouth twice per day   levETIRAcetam 500 MG tablet Commonly known as: KEPPRA Take 2 tablets twice per day   omeprazole 20 MG capsule Commonly known as: PRILOSEC Take 1 capsule (20 mg total) by mouth daily.   ondansetron 4 MG disintegrating tablet Commonly known as: Zofran ODT Take 1 tablet (4 mg total) by mouth every 8 (eight) hours as needed for nausea or vomiting.   ondansetron 4 MG tablet Commonly known as: ZOFRAN Take 1 tablet (4 mg total) by mouth every 8 (eight) hours as needed for up to 12 doses for nausea or vomiting.   pantoprazole 40 MG tablet Commonly known as: PROTONIX Take 1 tablet every 12 hours   promethazine 25 MG tablet Commonly known as: PHENERGAN Take 1 tablet (25 mg total) by mouth every 6 (six) hours as needed for nausea or vomiting.   sertraline 50 MG tablet Commonly known as: ZOLOFT Take 1 tablet each day      I consulted with Dr Gaynell Face regarding this patient.  Total time spent with the patient was 25 minutes, of which 50% or more was spent in counseling and coordination of care.  Rockwell Germany NP-C [facility] Ph. [phone] Fax [phone]

## 2019-08-19 ENCOUNTER — Encounter (INDEPENDENT_AMBULATORY_CARE_PROVIDER_SITE_OTHER): Payer: Self-pay | Admitting: Family

## 2019-08-19 DIAGNOSIS — M545 Low back pain, unspecified: Secondary | ICD-10-CM | POA: Insufficient documentation

## 2019-08-19 DIAGNOSIS — M2550 Pain in unspecified joint: Secondary | ICD-10-CM | POA: Insufficient documentation

## 2019-09-22 ENCOUNTER — Other Ambulatory Visit: Payer: Self-pay

## 2019-09-22 ENCOUNTER — Ambulatory Visit (INDEPENDENT_AMBULATORY_CARE_PROVIDER_SITE_OTHER): Payer: Medicaid Other | Admitting: Family

## 2019-09-22 ENCOUNTER — Encounter (INDEPENDENT_AMBULATORY_CARE_PROVIDER_SITE_OTHER): Payer: Self-pay | Admitting: Family

## 2019-09-22 DIAGNOSIS — G40309 Generalized idiopathic epilepsy and epileptic syndromes, not intractable, without status epilepticus: Secondary | ICD-10-CM

## 2019-09-22 DIAGNOSIS — F410 Panic disorder [episodic paroxysmal anxiety] without agoraphobia: Secondary | ICD-10-CM

## 2019-09-22 MED ORDER — SERTRALINE HCL 50 MG PO TABS: ORAL_TABLET | ORAL | 3 refills | Status: DC

## 2019-09-22 NOTE — Progress Notes (Signed)
Melinda Calderon   MRN:  [id number]  [birth year]   Provider: Rockwell Germany NP-C Location of Care: [facility]  Visit type: Routine visit  Last visit: 08/18/2019  Referral source: Virgel Manifold, MD History from: mom, patient, and chcn chart  Brief history:  History of generalized convulsive seizures, non-epileptic events and panic disorder. She is taking and tolerating Lamotrigine, Levetiracetam and Fycompa for seizures. She is taking Sertraline for panic.  Today's concerns:  Melinda Calderon and her mother report today that she continues to have frequent seizures vs panic episodes. She says that she had 2 on the way to this appointment today and had 10 episodes on Tuesday of this week. She describes the events as a wave coming that she is unable to stop, her body moves and jerks, and that she is unable to speak or respond to others. She and Mom say that the episodes are usually less than a minute or so and that Melinda Calderon is able to return to her activities afterwards. However they also say that the spells can last up to 30 minutes at times. Mom does not call EMS or take her to ER, but says that she talks to Melinda Calderon and gets the seizure to stop.   Melinda Calderon reports ongoing anxiety and panic. She is taking and tolerating Sertraline and feels that it has been more beneficial than Paxil. She has been counseled multiple times to get established with a psychiatrist but has not done so for reasons that are unclear to me. She also reports significant insomnia and rarely sleeps at night. She and her mother say that she usually sleeps during the day and remains awake at night.   Melinda Calderon complains of pain in the 4th finger of the left hand today. When she was last seen, she was complaining of generalized pain but says that has improved. She complains of frequently "spitting up" after taking her medications. She says that she feels her medication and stomach juices sliding up into her mouth  and that she sometimes spits out this material. She says that she usually takes her medication with some yogurt, then lies down after doing so.  Melinda Calderon report that she has been unable to find employment because of her medical condition, and her mother notes that her application for disability was denied.   Melinda Calderon has been otherwise generally healthy since she was last seen. She had blood drawn yesterday as I requested at her last visit. Neither Melinda Calderon nor her other have other health concerns for her today other than previously mentioned.   Review of systems: Please see HPI for neurologic and other pertinent review of systems. Otherwise all other systems were reviewed and were negative.  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 07/03/2016   Panic disorder 10/23/2015   Difficulty controlling anger 09/13/2015   Dysphonia 09/12/2015   Episodic tension-type headache, not intractable 02/15/2015   Syncope and collapse 02/15/2015   Migraine without aura 02/15/2015   Lack of adequate sleep 02/15/2015   Generalized convulsive epilepsy ([place]) 02/22/2014   Encounter for long-term current use of medication 02/22/2014   Seizure disorder ([place]) 12/27/2011   Seizure ([place]) 12/27/2011   Headache(784.0) 12/27/2011     Past Medical History:  Diagnosis Date   Anxiety    Seizures ([place])     Past medical history comments: See HPI Copied  from previous record: There have been problems with multiple missed appointments, difficulties obtaining clear history and concerns about possible non-epileptic events.She had an admission to [facility] in January 2018 for intentional overdose.  EEG 01/22/16 was normal awake, asleep and drowsy. Behaviors and complaints had no EEG correlates.  Surgical history: History reviewed. No pertinent  surgical history.   Family history: family history includes Cancer in her maternal grandmother and paternal grandmother; Cirrhosis in her maternal grandfather; Epilepsy in her maternal grandfather and paternal aunt; Hypertension in her mother; Seizures in her paternal grandfather.   Social history: Social History   Socioeconomic History   Marital status: Single    Spouse name: Not on file   Number of children: Not on file   Years of education: Not on file   Highest education level: Not on file  Occupational History   Not on file  Social Needs   Financial resource strain: Not on file   Food insecurity    Worry: Not on file    Inability: Not on file   Transportation needs    Medical: Not on file    Non-medical: Not on file  Tobacco Use   Smoking status: Passive Smoke Exposure - Never Smoker   Smokeless tobacco: Never Used   Tobacco comment: family smokes outside  Substance and Sexual Activity   Alcohol use: No    Alcohol/week: 0.0 standard drinks   Drug use: No   Sexual activity: Never  Lifestyle   Physical activity    Days per week: Not on file    Minutes per session: Not on file   Stress: Not on file  Relationships   Social connections    Talks on phone: Not on file    Gets together: Not on file    Attends religious service: Not on file    Active member of club or organization: Not on file    Attends meetings of clubs or organizations: Not on file    Relationship status: Not on file   Intimate partner violence    Fear of current or ex partner: Not on file    Emotionally abused: Not on file    Physically abused: Not on file    Forced sexual activity: Not on file  Other Topics Concern   Not on file  Social History Narrative   Melinda Calderon recently graduated HS; she will be attending [employer]   She lives with her parents and sibling.    She enjoys watching TV and being on her phone.     Past/failed meds: Paxil - did not improve anxiety and  panic  Allergies: No Known Allergies    Immunizations:  There is no immunization history on file for this patient.    Diagnostics/Screenings: EEG 01/22/16 was normal awake, asleep and drowsy. Behaviors and complaints had no EEG correlates.   Physical Exam: BP 118/72    Pulse 76    Ht 5' 0.5" (1.537 m)    Wt 135 lb 6.4 oz (61.4 kg)    BMI 26.01 kg/m   General: Well developed, well nourished young woman, seated on exam table, in no evident distress, dyed red and yellow hair, brown eyes, right handed Head: Head normocephalic and atraumatic.  Oropharynx benign. Neck: Supple with no carotid bruits Cardiovascular: Regular rate and rhythm, no murmurs Respiratory: Breath sounds clear to auscultation Musculoskeletal: No obvious deformities or scoliosis Skin: No rashes or neurocutaneous lesions  Neurologic Exam Mental Status: Awake and fully alert.  Oriented to place and  time.  Recent and remote memory intact.  Attention span, concentration, and fund of knowledge appropriate.  Mood and affect appropriate. Cranial Nerves: Fundoscopic exam reveals sharp disc margins.  Pupils equal, briskly reactive to light.  Extraocular movements full without nystagmus.  Visual fields full to confrontation.  Hearing intact and symmetric to finger rub.  Facial sensation intact.  Face tongue, palate move normally and symmetrically.  Neck flexion and extension normal. Motor: Normal bulk and tone. Normal strength in all tested extremity muscles. Sensory: Intact to touch and temperature in all extremities.  Coordination: Rapid alternating movements normal in all extremities.  Finger-to-nose and heel-to shin performed accurately bilaterally.  Romberg negative. Gait and Station: Arises from chair without difficulty.  Stance is normal. Gait demonstrates normal stride length and balance.   Able to heel, toe and tandem walk without difficulty. Reflexes: 1+ and symmetric. Toes downgoing.  Impression: 1. Generalized  convulsive seizures 2. 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 Panic disorder 4. Insomnia and disordered sleep 5. Complaints of pain in her finger 6. Probable GERD 7. [facility]   Recommendations for plan of care: [facility]. Melinda Calderon has neither had nor required imaging studies since the last visit. She had blood drawn yesterday and I reviewed the CBC, ALT and Sed Rate results with Melinda Calderon and her mother today. The Lamotrigine and Levetiracetam levels are pending. I will call her next week when the results are available. She is a 18 year old young woman with history of generalized convulsive seizures and probable non-epileptic events, panic disorder, insomnia and disordered sleep, complaints of pain in her finger, probable GERD, and [facility] overdose [facility]. Melinda Calderon continues to report frequent seizures vs panic episodes. The descriptions are not clear to me and I remain concerned about treatment with antiepileptic medication for non-epileptic events. I am also concerned about compliance with medication. I recommended that she sit upright after taking her medications to lessen the GERD symptoms and recommended that she follow up with her PCP for that problem. I again strongly urged her to get established with a psychiatrist and gave her information for [facility]. As Melinda Calderon will be 18 years old in January, I will plan to transfer her care to an adult neurology practice when I see her in follow up in December. Melinda Calderon and her mother agreed with the plans made today.    The medication list was reviewed and reconciled. No changes were made in the prescribed medications today. A complete medication list was provided to the patient.  Allergies as of 09/22/2019   No Known Allergies     Medication List       Accurate as of September 22, 2019   2:45 PM. If you have any questions, ask your nurse or doctor.        cetirizine 10 MG tablet Commonly known as: ZYRTEC Take 10 mg by mouth daily as needed for allergies.   dicyclomine 20 MG tablet Commonly known as: BENTYL Take 1 tablet (20 mg total) by mouth 2 (two) times daily as needed (Abdominal cramping).   fluticasone 50 MCG/ACT nasal spray Commonly known as: FLONASE Place 2 sprays into both nostrils daily as needed for allergies or rhinitis.   Fycompa 4 MG Tabs Generic drug: Perampanel Take 1 tablet at bedtime   ibuprofen 200 MG tablet Commonly known as: ADVIL Take 400 mg by mouth daily  as needed for moderate pain.   lamoTRIgine 100 MG tablet Commonly known as: LAMICTAL Take 2 tablets by mouth twice per day   levETIRAcetam 500 MG tablet Commonly known as: KEPPRA Take 2 tablets twice per day   omeprazole 20 MG capsule Commonly known as: PRILOSEC Take 1 capsule (20 mg total) by mouth daily.   ondansetron 4 MG disintegrating tablet Commonly known as: Zofran ODT Take 1 tablet (4 mg total) by mouth every 8 (eight) hours as needed for nausea or vomiting.   ondansetron 4 MG tablet Commonly known as: ZOFRAN Take 1 tablet (4 mg total) by mouth every 8 (eight) hours as needed for up to 12 doses for nausea or vomiting.   pantoprazole 40 MG tablet Commonly known as: PROTONIX Take 1 tablet every 12 hours   promethazine 25 MG tablet Commonly known as: PHENERGAN Take 1 tablet (25 mg total) by mouth every 6 (six) hours as needed for nausea or vomiting.   sertraline 50 MG tablet Commonly known as: ZOLOFT Take 1 tablet each day       I consulted with Dr Sharene Skeans regarding this patient.  Total time spent with the patient was 25 minutes, of which 50% or more was spent in counseling and coordination of care.  Elveria Rising NP-C [facility] Ph. [phone] Fax [phone]

## 2019-09-22 NOTE — Patient Instructions (Signed)
Thank you for coming in today.   Instructions for you until your next appointment are as follows: 1. Continue taking your medication as ordered 2. Call [facility] to schedule an appointment for anxiety and panic attacks. They also have walk in hours if you are having problems. I have given you a brochure about their services.  3. Three of the blood tests that you had done yesterday to check your blood counts, liver and a test for inflammation in your body - were all normal. There are two blood tests that are not ready yet because they take 4-7 days to finish. I will call you next week with those reports.  4. Try to stay upright longer after you eat and take medication. It sounds like you are having reflux which means that foods, medicines and stomach fluids slide up through your esophagus to your mouth. This can be reduced by sitting up and not lying down after eating and taking medications.  5. Please sign up for MyChart if you have not done so 6. Please plan to return for follow up in 2 months or sooner if needed.

## 2019-09-23 ENCOUNTER — Encounter (INDEPENDENT_AMBULATORY_CARE_PROVIDER_SITE_OTHER): Payer: Self-pay | Admitting: Family

## 2019-09-23 MED ORDER — LEVETIRACETAM 500 MG PO TABS: ORAL_TABLET | ORAL | 2 refills | Status: DC

## 2019-09-23 MED ORDER — LAMOTRIGINE 100 MG PO TABS: ORAL_TABLET | ORAL | 2 refills | Status: DC

## 2019-09-23 MED ORDER — FYCOMPA 4 MG PO TABS: ORAL_TABLET | ORAL | 2 refills | Status: DC

## 2019-09-24 LAB — CBC WITH DIFFERENTIAL/PLATELET
Absolute Monocytes: 442 cells/uL (ref 200–950)
Basophils Absolute: 32 cells/uL (ref 0–200)
Basophils Relative: 0.5 %
Eosinophils Absolute: 192 cells/uL (ref 15–500)
Eosinophils Relative: 3 %
HCT: 41.7 % (ref 35.0–45.0)
Hemoglobin: 13.6 g/dL (ref 11.7–15.5)
Lymphs Abs: 2509 cells/uL (ref 850–3900)
MCH: 30.3 pg (ref 27.0–33.0)
MCHC: 32.6 g/dL (ref 32.0–36.0)
MCV: 92.9 fL (ref 80.0–100.0)
MPV: 12.1 fL (ref 7.5–12.5)
Monocytes Relative: 6.9 %
Neutro Abs: 3226 cells/uL (ref 1500–7800)
Neutrophils Relative %: 50.4 %
Platelets: 263 10*3/uL (ref 140–400)
RBC: 4.49 10*6/uL (ref 3.80–5.10)
RDW: 12.1 % (ref 11.0–15.0)
Total Lymphocyte: 39.2 %
WBC: 6.4 10*3/uL (ref 3.8–10.8)

## 2019-09-24 LAB — LAMOTRIGINE LEVEL: Lamotrigine Lvl: 4.3 ug/mL (ref 4.0–18.0)

## 2019-09-24 LAB — LEVETIRACETAM LEVEL: Keppra (Levetiracetam): <1 ug/mL — ABNORMAL LOW (ref 12.0–46.0)

## 2019-09-24 LAB — ALT: ALT: 7 U/L (ref 6–29)

## 2019-09-24 LAB — SEDIMENTATION RATE: Sed Rate: 2 mm/h (ref 0–20)

## 2019-10-15 ENCOUNTER — Encounter (HOSPITAL_COMMUNITY): Payer: Self-pay

## 2019-10-15 ENCOUNTER — Emergency Department (HOSPITAL_COMMUNITY)
Admission: EM | Admit: 2019-10-15 | Discharge: 2019-10-15 | Disposition: A | Discharge status: Home / Self Care | Payer: Medicaid Other | Attending: Emergency Medicine | Admitting: Emergency Medicine

## 2019-10-15 ENCOUNTER — Other Ambulatory Visit: Payer: Self-pay

## 2019-10-15 DIAGNOSIS — R112 Nausea with vomiting, unspecified: Secondary | ICD-10-CM | POA: Insufficient documentation

## 2019-10-15 DIAGNOSIS — Z79899 Other long term (current) drug therapy: Secondary | ICD-10-CM | POA: Diagnosis not present

## 2019-10-15 DIAGNOSIS — Z7722 Contact with and (suspected) exposure to environmental tobacco smoke (acute) (chronic): Secondary | ICD-10-CM | POA: Diagnosis not present

## 2019-10-15 HISTORY — DX: Gastro-esophageal reflux disease without esophagitis: K21.9

## 2019-10-15 LAB — COMPREHENSIVE METABOLIC PANEL WITH GFR
ALT: 17 U/L (ref 0–44)
AST: 20 U/L (ref 15–41)
Albumin: 5 g/dL (ref 3.5–5.0)
Alkaline Phosphatase: 57 U/L (ref 38–126)
Anion gap: 14 (ref 5–15)
BUN: 12 mg/dL (ref 6–20)
CO2: 19 mmol/L — ABNORMAL LOW (ref 22–32)
Calcium: 9.3 mg/dL (ref 8.9–10.3)
Chloride: 108 mmol/L (ref 98–111)
Creatinine, Ser: 0.49 mg/dL (ref 0.44–1.00)
GFR calc Af Amer: >60 mL/min
GFR calc non Af Amer: >60 mL/min
Glucose, Bld: 130 mg/dL — ABNORMAL HIGH (ref 70–99)
Potassium: 3.3 mmol/L — ABNORMAL LOW (ref 3.5–5.1)
Sodium: 141 mmol/L (ref 135–145)
Total Bilirubin: 1.1 mg/dL (ref 0.3–1.2)
Total Protein: 7.7 g/dL (ref 6.5–8.1)

## 2019-10-15 LAB — CBC WITH DIFFERENTIAL/PLATELET
Abs Immature Granulocytes: 0.03 10*3/uL (ref 0.00–0.07)
Basophils Absolute: 0 10*3/uL (ref 0.0–0.1)
Basophils Relative: 0 %
Eosinophils Absolute: 0 10*3/uL (ref 0.0–0.5)
Eosinophils Relative: 0 %
HCT: 40.8 % (ref 36.0–46.0)
Hemoglobin: 13.7 g/dL (ref 12.0–15.0)
Immature Granulocytes: 0 %
Lymphocytes Relative: 9 %
Lymphs Abs: 1.1 10*3/uL (ref 0.7–4.0)
MCH: 30.6 pg (ref 26.0–34.0)
MCHC: 33.6 g/dL (ref 30.0–36.0)
MCV: 91.1 fL (ref 80.0–100.0)
Monocytes Absolute: 0.2 10*3/uL (ref 0.1–1.0)
Monocytes Relative: 2 %
Neutro Abs: 10.7 10*3/uL — ABNORMAL HIGH (ref 1.7–7.7)
Neutrophils Relative %: 89 %
Platelets: 333 10*3/uL (ref 150–400)
RBC: 4.48 MIL/uL (ref 3.87–5.11)
RDW: 12.5 % (ref 11.5–15.5)
WBC: 12.1 10*3/uL — ABNORMAL HIGH (ref 4.0–10.5)
nRBC: 0 % (ref 0.0–0.2)

## 2019-10-15 LAB — LIPASE, BLOOD: Lipase: 24 U/L (ref 11–51)

## 2019-10-15 LAB — I-STAT BETA HCG BLOOD, ED (MC, WL, AP ONLY): I-stat hCG, quantitative: <5 m[IU]/mL

## 2019-10-15 MED ORDER — ONDANSETRON HCL 4 MG PO TABS: 4.0000 mg | ORAL_TABLET | Freq: Three times a day (TID) | ORAL | 0 refills | Status: DC | PRN

## 2019-10-15 MED ORDER — HYDROMORPHONE HCL 1 MG/ML IJ SOLN
0.5000 mg | Freq: Once | INTRAMUSCULAR | Status: AC
  Administered 2019-10-15: 21:00:00 0.5 mg via INTRAVENOUS
  Filled 2019-10-15: qty 1

## 2019-10-15 MED ORDER — ONDANSETRON HCL 4 MG/2ML IJ SOLN
4.0000 mg | Freq: Once | INTRAMUSCULAR | Status: AC
  Administered 2019-10-15: 4 mg via INTRAVENOUS
  Filled 2019-10-15: qty 2

## 2019-10-15 MED ORDER — SODIUM CHLORIDE 0.9 % IV BOLUS
1000.0000 mL | Freq: Once | INTRAVENOUS | Status: AC
  Administered 2019-10-15: 21:00:00 1000 mL via INTRAVENOUS

## 2019-10-15 MED ORDER — HALOPERIDOL LACTATE 5 MG/ML IJ SOLN
2.0000 mg | Freq: Once | INTRAMUSCULAR | Status: AC
  Administered 2019-10-15: 23:00:00 2 mg via INTRAVENOUS
  Filled 2019-10-15: qty 1

## 2019-10-15 NOTE — ED Notes (Signed)
Patient had one episode of emesis prior to medication administration.

## 2019-10-15 NOTE — ED Triage Notes (Addendum)
Patient coming from home with complaints of nausea, vomiting, and LLQ abdominal pain that started this morning. Patient states that she believes her abdominal pain is from vomiting and her vomit contained blood. Patient has a history of GERD. EMS administered 4 mg of Zofran.

## 2019-10-19 NOTE — ED Provider Notes (Signed)
[facility]-EMERGENCY DEPT Provider Note   CSN: [id number] Arrival date & time: 10/15/19  1943     History   Chief Complaint Chief Complaint  Patient presents with  . Nausea  . Abdominal Pain    HPI Melinda Calderon is a 18 y.o. female.     HPI   18 year old female with nausea, vomiting and diarrhea.  Lower abdominal pain.  Somewhat worse on the left side.  Symptom onset yesterday.  Primarily concerned about the nausea and vomiting.  Did have 2 loose diarrheal stools this morning.  Small amount of blood in her vomitus.  She cannot remember if the pain or vomiting started first.  No fevers.  No sick contacts that she is aware of.  No urinary complaints.  No respiratory complaints.  4 mg of Zofran by EMS prior to arrival.  Otherwise no intervention.  Past Medical History:  Diagnosis Date  . Anxiety   . GERD (gastroesophageal reflux disease)   . Seizures [facility])     Patient Active Problem List   Diagnosis Date Noted  . Arthralgia 08/19/2019  . Low back pain 08/19/2019  . 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 Panic disorder 10/23/2015  . Difficulty controlling anger 09/13/2015  . Dysphonia 09/12/2015  . Episodic tension-type headache, not intractable 02/15/2015  . Syncope and collapse 02/15/2015  . Migraine without aura 02/15/2015  . Lack of adequate sleep 02/15/2015  . 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 Encounter for long-term current use of medication 02/22/2014  . Seizure disorder ([facility]) 12/27/2011  . Seizure ([facility]) 12/27/2011  . Headache(784.0) 12/27/2011    History reviewed. No pertinent surgical history.   OB History   No obstetric history on file.      Home Medications    Prior to Admission medications   Medication Sig Start Date End Date Taking? Authorizing Provider  cetirizine (ZYRTEC) 10 MG tablet Take 10 mg by mouth daily as needed for allergies.  01/26/18  Yes  [provider]  FYCOMPA 4 MG TABS Take 1 tablet at bedtime 09/23/19  Yes Goodpasture, Inetta Fermoina, NP  ibuprofen (ADVIL,MOTRIN) 200 MG tablet Take 400 mg by mouth daily as needed for moderate pain.    Yes [provider]  lamoTRIgine (LAMICTAL) 100 MG tablet Take 2 tablets by mouth twice per day 09/23/19  Yes Elveria RisingGoodpasture, Tina, NP  levETIRAcetam (KEPPRA) 500 MG tablet Take 2 tablets twice per day 09/23/19  Yes Elveria RisingGoodpasture, Tina, NP  sertraline (ZOLOFT) 50 MG tablet Take 1 tablet each day 09/22/19  Yes Elveria RisingGoodpasture, Tina, NP  dicyclomine (BENTYL) 20 MG tablet Take 1 tablet (20 mg total) by mouth 2 (two) times daily as needed (Abdominal cramping). Patient not taking: Reported on 02/09/2019 09/03/18   Ward, Chase PicketJaime Pilcher, PA-C  omeprazole (PRILOSEC) 20 MG capsule Take 1 capsule (20 mg total) by mouth daily. Patient not taking: Reported on 10/15/2019 04/26/18   Maczis, Elmer SowMichael M, PA-C  ondansetron (ZOFRAN ODT) 4 MG disintegrating tablet Take 1 tablet (4 mg total) by mouth every 8 (eight) hours as needed for nausea or vomiting. Patient not taking: Reported on 10/15/2019 03/04/18   Little, Ambrose Finlandachel Morgan, MD  ondansetron (ZOFRAN) 4 MG tablet Take 1 tablet (4 mg total) by mouth every 8 (eight) hours as needed for up to 12 doses for nausea or vomiting. Patient not taking: Reported on 10/15/2019 06/11/19   Virgina Norfolkuratolo, Adam, DO  ondansetron (ZOFRAN) 4 MG tablet Take 1 tablet (4 mg  total) by mouth every 8 (eight) hours as needed for nausea or vomiting. 10/15/19   Virgel Manifold, MD  pantoprazole (PROTONIX) 40 MG tablet Take 1 tablet every 12 hours Patient not taking: Reported on 10/15/2019 06/28/19   Rockwell Germany, NP  promethazine (PHENERGAN) 25 MG tablet Take 1 tablet (25 mg total) by mouth every 6 (six) hours as needed for nausea or vomiting. Patient not taking: Reported on 10/15/2019 10/30/18   Jeannett Senior, PA-C    Family History Family History  Problem Relation Age of Onset  .  Hypertension Mother   . Cancer Maternal Grandmother        Died at 7  . Cancer Paternal Grandmother        Died at 69  . Epilepsy Maternal Grandfather   . Cirrhosis Maternal Grandfather        Died at 55  . Seizures Paternal Grandfather        Died at 63  . Epilepsy Paternal Aunt     Social History Social History   Tobacco Use  . Smoking status: Passive Smoke Exposure - Never Smoker  . Smokeless tobacco: Never Used  . Tobacco comment: family smokes outside  Substance Use Topics  . Alcohol use: No    Alcohol/week: 0.0 standard drinks  . Drug use: No     Allergies   Patient has no known allergies.   Review of Systems Review of Systems  All systems reviewed and negative, other than as noted in HPI.  Physical Exam Updated Vital Signs BP (!) 105/94   Pulse (!) 102   Temp (!) 97.5 F (36.4 C) (Oral)   Resp 18   SpO2 100%   Physical Exam Vitals signs and nursing note reviewed.  Constitutional:      General: She is not in acute distress.    Appearance: She is well-developed.  HENT:     Head: Normocephalic and atraumatic.  Eyes:     General:        Right eye: No discharge.        Left eye: No discharge.     Conjunctiva/sclera: Conjunctivae normal.  Neck:     Musculoskeletal: Neck supple.  Cardiovascular:     Rate and Rhythm: Normal rate and regular rhythm.     Heart sounds: Normal heart sounds. No murmur. No friction rub. No gallop.   Pulmonary:     Effort: Pulmonary effort is normal. No respiratory distress.     Breath sounds: Normal breath sounds.  Abdominal:     General: There is no distension.     Palpations: Abdomen is soft.     Comments: Mild diffuse abdominal tenderness without rebound or guarding.  No distention.  Musculoskeletal:        General: No tenderness.  Skin:    General: Skin is warm and dry.  Neurological:     Mental Status: She is alert.  Psychiatric:        Behavior: Behavior normal.        Thought Content: Thought content  normal.      ED Treatments / Results  Labs (all labs ordered are listed, but only abnormal results are displayed) Labs Reviewed  CBC WITH DIFFERENTIAL/PLATELET - Abnormal; Notable for the following components:      Result Value   WBC 12.1 (*)    Neutro Abs 10.7 (*)    All other components within normal limits  COMPREHENSIVE METABOLIC PANEL - Abnormal; Notable for the following components:   Potassium 3.3 (*)  CO2 19 (*)    Glucose, Bld 130 (*)    All other components within normal limits  LIPASE, BLOOD  I-STAT BETA HCG BLOOD, ED ([facility], [facility], [facility] ONLY)    EKG None  Radiology No results found.  Procedures Procedures (including critical care time)  Medications Ordered in ED Medications  sodium chloride 0.9 % bolus 1,000 mL (0 mLs Intravenous Stopped 10/15/19 2233)  ondansetron (ZOFRAN) injection 4 mg (4 mg Intravenous Given 10/15/19 2100)  HYDROmorphone (DILAUDID) injection 0.5 mg (0.5 mg Intravenous Given 10/15/19 2100)  haloperidol lactate (HALDOL) injection 2 mg (2 mg Intravenous Given 10/15/19 2239)     Initial Impression / Assessment and Plan / ED Course  I have reviewed the triage vital signs and the nursing notes.  Pertinent labs & imaging results that were available during my care of the patient were reviewed by me and considered in my medical decision making (see chart for details).        18 year old female with nausea and vomiting.  Suspect viral GI illness.  Only minimal tenderness on exam.  Afebrile.  Hemodynamically stable.  She is symptomatically with much improvement of symptoms.  I feel she is appropriate for discharge at this time.  Continue symptomatic treatment.  Return precautions discussed.  Final Clinical Impressions(s) / ED Diagnoses   Final diagnoses:  Non-intractable vomiting with nausea, unspecified vomiting type    ED Discharge Orders         Ordered    ondansetron (ZOFRAN) 4 MG tablet  Every 8 hours PRN     10/15/19 2333            Raeford Razor, MD 10/19/19 [phone]

## 2019-11-23 ENCOUNTER — Ambulatory Visit (INDEPENDENT_AMBULATORY_CARE_PROVIDER_SITE_OTHER): Payer: Self-pay | Admitting: Family

## 2019-12-05 ENCOUNTER — Ambulatory Visit (INDEPENDENT_AMBULATORY_CARE_PROVIDER_SITE_OTHER): Payer: Medicaid Other | Admitting: Family

## 2019-12-14 ENCOUNTER — Ambulatory Visit: Payer: Medicaid Other | Attending: Internal Medicine

## 2019-12-14 DIAGNOSIS — Z20822 Contact with and (suspected) exposure to covid-19: Secondary | ICD-10-CM

## 2019-12-15 LAB — NOVEL CORONAVIRUS, NAA: SARS-CoV-2, NAA: DETECTED — AB

## 2019-12-16 ENCOUNTER — Telehealth: Payer: Self-pay | Admitting: Family Medicine

## 2019-12-16 NOTE — Telephone Encounter (Signed)
Attempted to notify patient of positive COVID 19 test results. Unable to leave message, voicemail has not been set up.    Nolon Nations  APRN, MSN, FNP-C Patient Care [facility] [address] [place], [place] [address] [phone]

## 2019-12-29 ENCOUNTER — Ambulatory Visit (INDEPENDENT_AMBULATORY_CARE_PROVIDER_SITE_OTHER): Payer: Medicaid Other | Admitting: Family

## 2019-12-29 ENCOUNTER — Other Ambulatory Visit: Payer: Self-pay

## 2019-12-29 ENCOUNTER — Encounter (INDEPENDENT_AMBULATORY_CARE_PROVIDER_SITE_OTHER): Payer: Self-pay | Admitting: Family

## 2019-12-29 DIAGNOSIS — Z72820 Sleep deprivation: Secondary | ICD-10-CM | POA: Diagnosis not present

## 2019-12-29 DIAGNOSIS — F410 Panic disorder [episodic paroxysmal anxiety] without agoraphobia: Secondary | ICD-10-CM

## 2019-12-29 DIAGNOSIS — F445 Conversion disorder with seizures or convulsions: Secondary | ICD-10-CM

## 2019-12-29 DIAGNOSIS — G40309 Generalized idiopathic epilepsy and epileptic syndromes, not intractable, without status epilepticus: Secondary | ICD-10-CM | POA: Diagnosis not present

## 2019-12-29 NOTE — Progress Notes (Signed)
This is a Pediatric Specialist E-Visit follow up consult provided via Telephone Melinda Calderon and their parent/guardian Melinda Calderon consented to an E-Visit consult today.  Location of patient: Melinda Calderon is at home Location of provider: Elveria Rising, MD is in office Patient was referred by Christel Mormon, MD   The following participants were involved in this E-Visit: patient, mother, CMA, provider  Chief Complain/ Reason for E-Visit today: Seizures Total time on call: 10 min Follow up: 2 months     Melinda Calderon   MRN:  [id number]  Jun 25, 2001   Provider: Elveria Rising NP-C Location of Care: [facility]  Visit type: Telehealth visit  Last visit: 09/22/2019  Referral source:  Ivory Broad, MD History from: mom, patient, and chcn chart  Brief history:  Copied from previous record: History of generalized convulsive seizures, non-epileptic events and panic disorder. She is taking and tolerating Lamotrigine, Levetiracetam and Fycompa for seizures. She is taking Sertraline for panic.  Today's concerns: Melinda Calderon and her mother report today that she continues to experience frequent events in which she is unresponsive and that her body jerks. The events usually last about a minute. She has not been injured as part of these events. Melinda Calderon has ongoing problems with anxiety and panic, as well as insomnia. Mom reports that she has an upcoming appointment with psychiatry in early February. She said that she had an appointment this month but had to reschedule because of being diagnosed with Covid 19 on December 16, 2019.   Melinda Calderon and her mother report that she has been compliant with medication. She complains of ongoing chest discomfort and says that she has occasional vomiting with it. She has other ongoing generalized complaints of body pain and discomfort. She has been otherwise generally healthy and has no other health concerns today other than  previously mentioned.   Review of systems: Please see HPI for neurologic and other pertinent review of systems. Otherwise all other systems were reviewed and were negative.  Problem List: Patient Active Problem List   Diagnosis Date Noted  . Arthralgia 08/19/2019  . Low back pain 08/19/2019  . 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 Panic disorder 10/23/2015  . Difficulty controlling anger 09/13/2015  . Dysphonia 09/12/2015  . Episodic tension-type headache, not intractable 02/15/2015  . Syncope and collapse 02/15/2015  . Migraine without aura 02/15/2015  . Lack of adequate sleep 02/15/2015  . 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 Encounter for long-term current use of medication 02/22/2014  . Seizure disorder (HCC) 12/27/2011  . Seizure (HCC) 12/27/2011  . Headache(784.0) 12/27/2011     Past Medical History:  Diagnosis Date  . Anxiety   . GERD (gastroesophageal reflux disease)   . Seizures (HCC)     Past medical history comments: See HPI Copied from previous record: There have been problems with multiple missed appointments, difficulties obtaining clear history and concerns about possible non-epileptic events.She had an admission to [facility] in January 2018 for intentional overdose.  EEG 01/22/16 was normal awake, asleep and drowsy. Behaviors and complaints had no EEG correlates.  Surgical history: History reviewed. No pertinent surgical history.   Family history: family history includes Cancer in her maternal grandmother and paternal grandmother; Cirrhosis in her maternal grandfather; Epilepsy in her maternal grandfather and paternal aunt; Hypertension in her mother; Seizures in her paternal grandfather.   Social history: Social History   Socioeconomic History  . Marital status: Single  Spouse name: Not on file  . Number of children: Not on file  . Years of education: Not on file  . Highest  education level: Not on file  Occupational History  . Not on file  Tobacco Use  . Smoking status: Passive Smoke Exposure - Never Smoker  . Smokeless tobacco: Never Used  . Tobacco comment: family smokes outside  Substance and Sexual Activity  . Alcohol use: No    Alcohol/week: 0.0 standard drinks  . Drug use: No  . Sexual activity: Never  Other Topics Concern  . Not on file  Social History Narrative   Melinda Calderon recently graduated HS; she will be attending [employer]   She lives with her parents and sibling.    She enjoys watching TV and being on her phone.   Social Determinants of Health   Financial Resource Strain:   . Difficulty of Paying Living Expenses: Not on file  Food Insecurity:   . Worried About Charity fundraiser in the Last Year: Not on file  . Ran Out of Food in the Last Year: Not on file  Transportation Needs:   . Lack of Transportation (Medical): Not on file  . Lack of Transportation (Non-Medical): Not on file  Physical Activity:   . Days of Exercise per Week: Not on file  . Minutes of Exercise per Session: Not on file  Stress:   . Feeling of Stress : Not on file  Social Connections:   . Frequency of Communication with Friends and Family: Not on file  . Frequency of Social Gatherings with Friends and Family: Not on file  . Attends Religious Services: Not on file  . Active Member of Clubs or Organizations: Not on file  . Attends Archivist Meetings: Not on file  . Marital Status: Not on file  Intimate Partner Violence:   . Fear of Current or Ex-Partner: Not on file  . Emotionally Abused: Not on file  . Physically Abused: Not on file  . Sexually Abused: Not on file    Past/failed meds: Paxil - did not improve anxiety and panic  Allergies: No Known Allergies   Immunizations:  There is no immunization history on file for this patient.    Diagnostics/Screenings: EEG 01/22/16 was normal awake, asleep and drowsy. Behaviors and complaints had no  EEG correlates.  Physical Exam: There were no vitals taken for this visit.  There was no examination as this was a telephone visit.   Impression: 1. 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 Generalized convulsive seizures 3. Panic disorder 4. Insomnia and disordered sleep 5. Probable GERD 6. History of overdose and admission to [facility] in January 2018  Recommendations for plan of care: The patient's previous [facility] records were reviewed. Melinda Calderon has neither had nor required imaging or lab studies since the last visit other than what was performed at an ER visit in November for vomiting. She continues to report events in which it is unclear if they are epileptic or non-epileptic in nature. She says that she is compliant with medication. I strongly encouraged Melinda Calderon to keep her appointment with [facility] in February. I will otherwise see her back in follow up in 2 months or sooner if needed.   The medication list was reviewed and reconciled. No changes were made in the prescribed medications today. A complete medication list was provided to the patient.  Allergies as of 12/29/2019   No Known Allergies     Medication List  Accurate as of December 29, 2019 11:59 PM. If you have any questions, ask your nurse or doctor.        STOP taking these medications   dicyclomine 20 MG tablet Commonly known as: BENTYL Stopped by: Elveria Rising, NP   omeprazole 20 MG capsule Commonly known as: PRILOSEC Stopped by: Elveria Rising, NP   ondansetron 4 MG disintegrating tablet Commonly known as: Zofran ODT Stopped by: Elveria Rising, NP   ondansetron 4 MG tablet Commonly known as: Zofran Stopped by: Elveria Rising, NP   pantoprazole 40 MG tablet Commonly known as: PROTONIX Stopped by: Elveria Rising, NP   promethazine 25 MG tablet Commonly known as: PHENERGAN Stopped by: Elveria Rising, NP     TAKE these medications   cetirizine 10 MG tablet Commonly  known as: ZYRTEC Take 10 mg by mouth daily as needed for allergies.   Fycompa 4 MG Tabs Generic drug: Perampanel Take 1 tablet at bedtime   ibuprofen 200 MG tablet Commonly known as: ADVIL Take 400 mg by mouth daily as needed for moderate pain.   lamoTRIgine 100 MG tablet Commonly known as: LAMICTAL Take 2 tablets by mouth twice per day   levETIRAcetam 500 MG tablet Commonly known as: KEPPRA Take 2 tablets twice per day   sertraline 50 MG tablet Commonly known as: ZOLOFT Take 1 tablet each day       Total time spent on the phone with the patient was 10 minutes, of which 50% or more was spent in counseling and coordination of care.  Elveria Rising NP-C [facility] Child [facility]rology Ph. [phone] Fax [phone]

## 2019-12-31 ENCOUNTER — Encounter (INDEPENDENT_AMBULATORY_CARE_PROVIDER_SITE_OTHER): Payer: Self-pay | Admitting: Family

## 2019-12-31 NOTE — Patient Instructions (Signed)
Thank you for talking with me by phone today.   Instructions for you until your next appointment are as follows: 1. Be sure to keep the Behavioral Health appointment in February. It is very important that you go to this visit to get help with your panic and anxiety.  2. Continue your medications as prescribed.  3. Work on sleeping more at night and less during the day, as we have discussed in the past.  4. Please sign up for MyChart if you have not done so 5. Please plan to return for follow up in 2 months or sooner if needed.

## 2020-02-24 ENCOUNTER — Telehealth (INDEPENDENT_AMBULATORY_CARE_PROVIDER_SITE_OTHER): Payer: Self-pay | Admitting: Family

## 2020-02-24 ENCOUNTER — Ambulatory Visit (INDEPENDENT_AMBULATORY_CARE_PROVIDER_SITE_OTHER): Payer: Medicaid Other | Admitting: Family

## 2020-02-24 ENCOUNTER — Encounter (INDEPENDENT_AMBULATORY_CARE_PROVIDER_SITE_OTHER): Payer: Self-pay | Admitting: Family

## 2020-02-24 NOTE — Telephone Encounter (Signed)
Due to multiple missed appointments and exceeding the age of 41, we have referred patient to [facility] to see an adult Neurologist. I left a voicemail for patient advising to return my call so that I can make her aware of this. I also mailed a letter to her home address. I have entered the referral to [facility] and confirmed with them that they received it. They will contact patient to schedule. Melinda Calderon

## 2020-02-24 NOTE — Telephone Encounter (Signed)
Melinda Calderon did not show for her appointment with me today. I called the pharmacy and learned that she last filled Lamotrigine, Levetiracetam and Fycompa on December 17, 2019. I will talk with practice office manager about how to proceed. TG

## 2020-03-09 ENCOUNTER — Encounter: Payer: Self-pay | Admitting: Neurology

## 2020-04-09 ENCOUNTER — Ambulatory Visit (INDEPENDENT_AMBULATORY_CARE_PROVIDER_SITE_OTHER): Payer: Medicaid Other | Admitting: Family

## 2020-05-21 DIAGNOSIS — Z0289 Encounter for other administrative examinations: Secondary | ICD-10-CM

## 2020-06-12 ENCOUNTER — Ambulatory Visit: Payer: Medicaid Other | Admitting: Neurology

## 2020-06-28 ENCOUNTER — Emergency Department (HOSPITAL_COMMUNITY)
Admission: EM | Admit: 2020-06-28 | Discharge: 2020-06-28 | Disposition: A | Discharge status: Home / Self Care | Payer: Medicaid Other | Attending: Emergency Medicine | Admitting: Emergency Medicine

## 2020-06-28 ENCOUNTER — Emergency Department (HOSPITAL_COMMUNITY): Payer: Medicaid Other

## 2020-06-28 DIAGNOSIS — Z7722 Contact with and (suspected) exposure to environmental tobacco smoke (acute) (chronic): Secondary | ICD-10-CM | POA: Diagnosis not present

## 2020-06-28 DIAGNOSIS — R112 Nausea with vomiting, unspecified: Secondary | ICD-10-CM

## 2020-06-28 DIAGNOSIS — R1084 Generalized abdominal pain: Secondary | ICD-10-CM | POA: Insufficient documentation

## 2020-06-28 LAB — COMPREHENSIVE METABOLIC PANEL
ALT: 46 U/L — ABNORMAL HIGH (ref 0–44)
AST: 49 U/L — ABNORMAL HIGH (ref 15–41)
Albumin: 4.9 g/dL (ref 3.5–5.0)
Alkaline Phosphatase: 56 U/L (ref 38–126)
Anion gap: 16 — ABNORMAL HIGH (ref 5–15)
BUN: 9 mg/dL (ref 6–20)
CO2: 21 mmol/L — ABNORMAL LOW (ref 22–32)
Calcium: 9.7 mg/dL (ref 8.9–10.3)
Chloride: 107 mmol/L (ref 98–111)
Creatinine, Ser: 0.72 mg/dL (ref 0.44–1.00)
GFR calc Af Amer: >60 mL/min (ref >60)
GFR calc non Af Amer: >60 mL/min (ref >60)
Glucose, Bld: 137 mg/dL — ABNORMAL HIGH (ref 70–99)
Potassium: 3.6 mmol/L (ref 3.5–5.1)
Sodium: 144 mmol/L (ref 135–145)
Total Bilirubin: 0.5 mg/dL (ref 0.3–1.2)
Total Protein: 8 g/dL (ref 6.5–8.1)

## 2020-06-28 LAB — URINALYSIS, ROUTINE W REFLEX MICROSCOPIC
Bilirubin Urine: NEGATIVE
Glucose, UA: NEGATIVE mg/dL
Ketones, ur: 20 mg/dL — AB
Leukocytes,Ua: NEGATIVE
Nitrite: NEGATIVE
Protein, ur: NEGATIVE mg/dL
Specific Gravity, Urine: 1.013 (ref 1.005–1.030)
pH: 8 (ref 5.0–8.0)

## 2020-06-28 LAB — RAPID URINE DRUG SCREEN, HOSP PERFORMED
Amphetamines: NOT DETECTED
Barbiturates: NOT DETECTED
Benzodiazepines: NOT DETECTED
Cocaine: NOT DETECTED
Opiates: NOT DETECTED
Tetrahydrocannabinol: POSITIVE — AB

## 2020-06-28 LAB — I-STAT BETA HCG BLOOD, ED (MC, WL, AP ONLY): I-stat hCG, quantitative: <5 m[IU]/mL (ref <5)

## 2020-06-28 LAB — LIPASE, BLOOD: Lipase: 27 U/L (ref 11–51)

## 2020-06-28 LAB — CBC
HCT: 40.4 % (ref 36.0–46.0)
Hemoglobin: 13.7 g/dL (ref 12.0–15.0)
MCH: 31.6 pg (ref 26.0–34.0)
MCHC: 33.9 g/dL (ref 30.0–36.0)
MCV: 93.1 fL (ref 80.0–100.0)
Platelets: 429 10*3/uL — ABNORMAL HIGH (ref 150–400)
RBC: 4.34 MIL/uL (ref 3.87–5.11)
RDW: 13.2 % (ref 11.5–15.5)
WBC: 12 10*3/uL — ABNORMAL HIGH (ref 4.0–10.5)
nRBC: 0 % (ref 0.0–0.2)

## 2020-06-28 MED ORDER — PROMETHAZINE HCL 25 MG/ML IJ SOLN
12.5000 mg | Freq: Once | INTRAMUSCULAR | Status: AC
  Administered 2020-06-28: 12.5 mg via INTRAVENOUS
  Filled 2020-06-28: qty 1

## 2020-06-28 MED ORDER — IOHEXOL 300 MG/ML  SOLN
100.0000 mL | Freq: Once | INTRAMUSCULAR | Status: AC | PRN
  Administered 2020-06-28: 100 mL via INTRAVENOUS

## 2020-06-28 MED ORDER — LACTATED RINGERS IV BOLUS
1000.0000 mL | Freq: Once | INTRAVENOUS | Status: AC
  Administered 2020-06-28: 1000 mL via INTRAVENOUS

## 2020-06-28 MED ORDER — SODIUM CHLORIDE 0.9% FLUSH
3.0000 mL | Freq: Once | INTRAVENOUS | Status: AC
  Administered 2020-06-28: 3 mL via INTRAVENOUS

## 2020-06-28 MED ORDER — SODIUM CHLORIDE (PF) 0.9 % IJ SOLN
INTRAMUSCULAR | Status: AC
  Filled 2020-06-28: qty 50

## 2020-06-28 MED ORDER — SODIUM CHLORIDE 0.9 % IV BOLUS
1000.0000 mL | Freq: Once | INTRAVENOUS | Status: AC
  Administered 2020-06-28: 1000 mL via INTRAVENOUS

## 2020-06-28 MED ORDER — PROMETHAZINE HCL 25 MG RE SUPP: 25.0000 mg | Freq: Four times a day (QID) | RECTAL | 0 refills | Status: DC | PRN

## 2020-06-28 MED ORDER — DICYCLOMINE HCL 20 MG PO TABS
20.0000 mg | ORAL_TABLET | Freq: Two times a day (BID) | ORAL | 0 refills | Status: DC
Start: 2020-06-28 — End: 2020-12-31

## 2020-06-28 MED ORDER — ONDANSETRON HCL 4 MG/2ML IJ SOLN
4.0000 mg | Freq: Once | INTRAMUSCULAR | Status: AC
  Administered 2020-06-28: 4 mg via INTRAVENOUS
  Filled 2020-06-28: qty 2

## 2020-06-28 MED ORDER — PROMETHAZINE HCL 25 MG PO TABS
25.0000 mg | ORAL_TABLET | Freq: Four times a day (QID) | ORAL | 0 refills | Status: DC | PRN
Start: 2020-06-28 — End: 2020-12-31

## 2020-06-28 NOTE — ED Notes (Signed)
Gave patient a frozen Melinda Calderon popsicle

## 2020-06-28 NOTE — ED Provider Notes (Signed)
[facility]-EMERGENCY DEPT Provider Note   CSN: [id number] Arrival date & time: 06/28/20  0759     History Chief Complaint  Patient presents with  . Nausea  . Emesis    Melinda Calderon is a 19 y.o. female.  HPI      Melinda Calderon is a 19 y.o. female, with a history of anxiety, GERD, seizures, presenting to the ED with nausea and vomiting beginning this morning around 1:30 AM. She endorses multiple episodes of nonbloody nonbilious emesis. She then began to have generalized abdominal cramping, mostly with vomiting. She states she has had this presentation of symptoms before. Denies use of alcohol or illicit drugs.  States she last took her seizure medication last night. Denies fever, diarrhea, hematochezia/melena, chest pain, shortness of breath, cough, urinary symptoms, abnormal vaginal discharge/bleeding, or any other complaints.    Past Medical History:  Diagnosis Date  . Anxiety   . GERD (gastroesophageal reflux disease)   . Seizures [facility](HCC)     Patient Active Problem List   Diagnosis Date Noted  . Arthralgia 08/19/2019  . Low back pain 08/19/2019  . 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 Panic disorder 10/23/2015  . Difficulty controlling anger 09/13/2015  . Dysphonia 09/12/2015  . Episodic tension-type headache, not intractable 02/15/2015  . Syncope and collapse 02/15/2015  . Migraine without aura 02/15/2015  . Lack of adequate sleep 02/15/2015  . 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 Encounter for long-term current use of medication 02/22/2014  . Seizure disorder (HCC) 12/27/2011  . Seizure (HCC) 12/27/2011  . Headache(784.0) 12/27/2011    No past surgical history on file.   OB History   No obstetric history on file.     Family History  Problem Relation Age of Onset  . Hypertension Mother   . Cancer Maternal Grandmother        Died at 7072  . Cancer Paternal  Grandmother        Died at 2361  . Epilepsy Maternal Grandfather   . Cirrhosis Maternal Grandfather        Died at 3759  . Seizures Paternal Grandfather        Died at 4759  . Epilepsy Paternal Aunt     Social History   Tobacco Use  . Smoking status: Passive Smoke Exposure - Never Smoker  . Smokeless tobacco: Never Used  . Tobacco comment: family smokes outside  Substance Use Topics  . Alcohol use: No    Alcohol/week: 0.0 standard drinks  . Drug use: No    Home Medications Prior to Admission medications   Medication Sig Start Date End Date Taking? Authorizing Provider  cetirizine (ZYRTEC) 10 MG tablet Take 10 mg by mouth daily as needed for allergies.  01/26/18   [provider]  dicyclomine (BENTYL) 20 MG tablet Take 1 tablet (20 mg total) by mouth 2 (two) times daily. 06/28/20   Amera Banos, Hillard DankerShawn C, PA-C  FYCOMPA 4 MG TABS Take 1 tablet at bedtime 09/23/19   Elveria RisingGoodpasture, Tina, NP  ibuprofen (ADVIL,MOTRIN) 200 MG tablet Take 400 mg by mouth daily as needed for moderate pain.     [provider]  lamoTRIgine (LAMICTAL) 100 MG tablet Take 2 tablets by mouth twice per day 09/23/19   Elveria RisingGoodpasture, Tina, NP  levETIRAcetam (KEPPRA) 500 MG tablet Take 2 tablets twice per day 09/23/19   Elveria RisingGoodpasture, Tina, NP  promethazine (PHENERGAN) 25 MG suppository Place 1 suppository (25 mg  total) rectally every 6 (six) hours as needed for nausea or vomiting. 06/28/20   Fitzhugh Vizcarrondo C, PA-C  promethazine (PHENERGAN) 25 MG tablet Take 1 tablet (25 mg total) by mouth every 6 (six) hours as needed for nausea or vomiting. 06/28/20   Nicholl Onstott C, PA-C  sertraline (ZOLOFT) 50 MG tablet Take 1 tablet each day 09/22/19   Elveria Rising, NP    Allergies    Patient has no known allergies.  Review of Systems   Review of Systems  Constitutional: Negative for chills and fever.  Respiratory: Negative for cough and shortness of breath.   Cardiovascular: Negative for chest pain.  Gastrointestinal: Positive  for abdominal pain, nausea and vomiting. Negative for blood in stool and diarrhea.  Genitourinary: Negative for dysuria, frequency, hematuria, vaginal bleeding and vaginal discharge.  Neurological: Negative for syncope.  All other systems reviewed and are negative.   Physical Exam Updated Vital Signs BP (!) 132/94 (BP Location: Left Arm)   Pulse 74   Resp 18   Ht 5' (1.524 m)   Wt 61.2 kg   LMP 06/25/2020 (Exact Date)   SpO2 100%   BMI 26.37 kg/m   Physical Exam Vitals and nursing note reviewed.  Constitutional:      General: She is not in acute distress.    Appearance: She is well-developed. She is not diaphoretic.  HENT:     Head: Normocephalic and atraumatic.     Mouth/Throat:     Mouth: Mucous membranes are moist.     Pharynx: Oropharynx is clear.  Eyes:     Conjunctiva/sclera: Conjunctivae normal.  Cardiovascular:     Rate and Rhythm: Normal rate and regular rhythm.     Pulses: Normal pulses.          Radial pulses are 2+ on the right side and 2+ on the left side.       Posterior tibial pulses are 2+ on the right side and 2+ on the left side.     Heart sounds: Normal heart sounds.     Comments: Tactile temperature in the extremities appropriate and equal bilaterally. Pulmonary:     Effort: Pulmonary effort is normal. No respiratory distress.     Breath sounds: Normal breath sounds.  Abdominal:     Palpations: Abdomen is soft.     Tenderness: There is generalized abdominal tenderness. There is no guarding.     Comments: Though it should be noted that the patient says, "oww" before I barely touch her abdomen."  Musculoskeletal:     Cervical back: Neck supple.     Right lower leg: No edema.     Left lower leg: No edema.  Lymphadenopathy:     Cervical: No cervical adenopathy.  Skin:    General: Skin is warm and dry.  Neurological:     Mental Status: She is alert.  Psychiatric:        Mood and Affect: Mood and affect normal.        Speech: Speech normal.         Behavior: Behavior normal.     ED Results / Procedures / Treatments   Labs (all labs ordered are listed, but only abnormal results are displayed) Labs Reviewed  COMPREHENSIVE METABOLIC PANEL - Abnormal; Notable for the following components:      Result Value   CO2 21 (*)    Glucose, Bld 137 (*)    AST 49 (*)    ALT 46 (*)    Anion  gap 16 (*)    All other components within normal limits  CBC - Abnormal; Notable for the following components:   WBC 12.0 (*)    Platelets 429 (*)    All other components within normal limits  URINALYSIS, ROUTINE W REFLEX MICROSCOPIC - Abnormal; Notable for the following components:   Hgb urine dipstick LARGE (*)    Ketones, ur 20 (*)    Bacteria, UA RARE (*)    All other components within normal limits  RAPID URINE DRUG SCREEN, HOSP PERFORMED - Abnormal; Notable for the following components:   Tetrahydrocannabinol POSITIVE (*)    All other components within normal limits  LIPASE, BLOOD  I-STAT BETA HCG BLOOD, ED ([facility], [facility], [facility] ONLY)    EKG None  Radiology CT ABDOMEN PELVIS W CONTRAST  Result Date: 06/28/2020 CLINICAL DATA:  Abdominal pain EXAM: CT ABDOMEN AND PELVIS WITH CONTRAST TECHNIQUE: Multidetector CT imaging of the abdomen and pelvis was performed using the standard protocol following bolus administration of intravenous contrast. CONTRAST:  OMNIPAQUE IOHEXOL 300 MG/ML  SOLN COMPARISON:  09/03/2018 FINDINGS: Lower chest: Lung bases are clear. No effusions. Heart is normal size. Hepatobiliary: No focal hepatic abnormality. Gallbladder unremarkable. Pancreas: No focal abnormality or ductal dilatation. Spleen: No focal abnormality.  Normal size. Adrenals/Urinary Tract: No adrenal abnormality. No focal renal abnormality. No stones or hydronephrosis. Urinary bladder is unremarkable. Stomach/Bowel: Stomach, large and small bowel grossly unremarkable. Appendix not visualized. No inflammatory process in the right lower quadrant. Vascular/Lymphatic:  No evidence of aneurysm or adenopathy. Reproductive: Uterus and adnexa unremarkable.  No mass. Other: No free fluid or free air. Musculoskeletal: No acute bony abnormality. IMPRESSION: No acute findings in the abdomen or pelvis. Appendix not visualized.  No pericecal inflammation. Electronically Signed   By: Charlett Nose M.D.   On: 06/28/2020 13:44    Procedures Procedures (including critical care time)  Medications Ordered in ED Medications  sodium chloride (PF) 0.9 % injection (has no administration in time range)  sodium chloride flush (NS) 0.9 % injection 3 mL (3 mLs Intravenous Given 06/28/20 0913)  ondansetron (ZOFRAN) injection 4 mg (4 mg Intravenous Given 06/28/20 0908)  sodium chloride 0.9 % bolus 1,000 mL (0 mLs Intravenous Stopped 06/28/20 1450)  promethazine (PHENERGAN) injection 12.5 mg (12.5 mg Intravenous Given 06/28/20 1026)  promethazine (PHENERGAN) injection 12.5 mg (12.5 mg Intravenous Given 06/28/20 1219)  lactated ringers bolus 1,000 mL (0 mLs Intravenous Stopped 06/28/20 1451)  iohexol (OMNIPAQUE) 300 MG/ML solution 100 mL (100 mLs Intravenous Contrast Given 06/28/20 1302)  promethazine (PHENERGAN) injection 12.5 mg (12.5 mg Intravenous Given 06/28/20 1451)    ED Course  I have reviewed the triage vital signs and the nursing notes.  Pertinent labs & imaging results that were available during my care of the patient were reviewed by me and considered in my medical decision making (see chart for details).  Clinical Course as of Jun 29 1511  Thu Jun 28, 2020  1408 Patient currently menstruating.  Hgb urine dipstick(!): LARGE [SJ]  1411 Patient states she feels much better.  Abdominal exam now benign.   [SJ]    Clinical Course User Index [SJ] Torrence Hammack C, PA-C   MDM Rules/Calculators/A&P                          Patient presents primarily with nausea and vomiting, but also endorses generalized abdominal cramping. Patient is nontoxic appearing, afebrile, not  tachycardic, not tachypneic, not hypotensive,  maintains excellent SPO2 on room air.  I have reviewed the patient's chart to obtain more information.   I reviewed and interpreted the patient's labs and radiological studies. Mild leukocytosis. Mild decrease in CO2 as well as ketonuria suspected to be a product of dehydration.  Similarly, patient's leukocytosis, mildly increased anion gap, and elevation in platelet count could all be due to hemoconcentration. Patient states she has had this issue in the past.  Recommend follow-up with gastroenterology. Upon recheck of the patient's abdomen, she no longer had generalized abdominal tenderness, but only tenderness in the epigastric region.  Prior to discharge, her abdominal exam was completely benign. The appendix was not specifically visualized on CT today, however, my suspicion for appendicitis is low based on evolution of the patient's abdominal exam.  Tolerating PO fluids at time of discharge.   The patient was given instructions for home care as well as strict return precautions. Patient voices understanding of these instructions, accepts the plan, and is comfortable with discharge.    Vitals:   06/28/20 1332 06/28/20 1400 06/28/20 1430 06/28/20 1436  BP: 112/70 (!) 106/60 122/80 122/80  Pulse: 96 94 97 98  Resp:    18  Temp:    97.8 F (36.6 C)  TempSrc:    Oral  SpO2: 100% 99% 99% 100%  Weight:      Height:         Final Clinical Impression(s) / ED Diagnoses Final diagnoses:  Non-intractable vomiting with nausea, unspecified vomiting type    Rx / DC Orders ED Discharge Orders         Ordered    promethazine (PHENERGAN) 25 MG suppository  Every 6 hours PRN     Discontinue  Reprint     06/28/20 1412    promethazine (PHENERGAN) 25 MG tablet  Every 6 hours PRN     Discontinue  Reprint     06/28/20 1412    dicyclomine (BENTYL) 20 MG tablet  2 times daily     Discontinue  Reprint     06/28/20 1416           Anselm Pancoast,  PA-C 06/28/20 1514    Tegeler, Canary Brim, MD 06/28/20 1551

## 2020-06-28 NOTE — ED Notes (Signed)
Patient ate an [place]. She tolerated it well.

## 2020-06-28 NOTE — ED Triage Notes (Signed)
Pt BIB EMS from home for N/V, generalized abdominal pain since 2am. Pt vomited x3 with ems.

## 2020-06-28 NOTE — Discharge Instructions (Addendum)
Nausea, Vomiting, and Diarrhea  Hand washing: Wash your hands throughout the day, but especially before and after touching the face, using the restroom, sneezing, coughing, or touching surfaces that have been coughed or sneezed upon. Hydration: Symptoms will be intensified and complicated by dehydration. Dehydration can also extend the duration of symptoms. Drink plenty of fluids and get plenty of rest. You should be drinking at least half a liter of water an hour to stay hydrated. Electrolyte drinks (ex. Gatorade, Powerade, Pedialyte) are also encouraged. You should be drinking enough fluids to make your urine light yellow, almost clear. If this is not the case, you are not drinking enough water. Please note that some of the treatments indicated below will not be effective if you are not adequately hydrated. Diet: Please concentrate on hydration, however, you may introduce food slowly.  Start with a clear liquid diet, progressed to a full liquid diet, and then bland solids as you are able. Pain or fever: Ibuprofen, Naproxen, or Tylenol for pain or fever.  Nausea/vomiting: Use the promethazine (generic for Phenergan) for nausea or vomiting.  This medication may not prevent all vomiting or nausea, but can help facilitate better hydration. Things that can help with nausea/vomiting also include peppermint/menthol candies, vitamin B12, and ginger. Diarrhea: May use medications such as loperamide (Imodium) or Bismuth subsalicylate (Pepto-Bismol). Dicyclomine: Dicyclomine (generic for Bentyl) is what is known as an antispasmodic and is intended to help reduce abdominal discomfort. Follow-up: Follow-up with a primary care provider on this matter. Follow-up with gastroenterology for your recurrent epigastric pain. Return: Return should you develop a fever, bloody diarrhea, increased abdominal pain, uncontrolled vomiting, or any other major concerns.  For prescription assistance, may try using prescription  discount sites or apps, such as goodrx.com  Recommend follow-up with gastroenterology for continued symptoms.

## 2020-06-28 NOTE — ED Notes (Signed)
Patient refuses to PO challenge at this moment due to nausea.

## 2020-09-03 ENCOUNTER — Other Ambulatory Visit: Payer: Self-pay

## 2020-09-03 ENCOUNTER — Encounter: Payer: Self-pay | Admitting: Neurology

## 2020-09-03 ENCOUNTER — Ambulatory Visit (INDEPENDENT_AMBULATORY_CARE_PROVIDER_SITE_OTHER): Payer: Medicaid Other | Admitting: Neurology

## 2020-09-03 ENCOUNTER — Telehealth: Payer: Self-pay | Admitting: Neurology

## 2020-09-03 VITALS — BP 138/88 | HR 74 | Resp 18 | Ht 60.0 in | Wt 122.0 lb

## 2020-09-03 DIAGNOSIS — F410 Panic disorder [episodic paroxysmal anxiety] without agoraphobia: Secondary | ICD-10-CM

## 2020-09-03 DIAGNOSIS — G40309 Generalized idiopathic epilepsy and epileptic syndromes, not intractable, without status epilepticus: Secondary | ICD-10-CM | POA: Diagnosis not present

## 2020-09-03 DIAGNOSIS — G44219 Episodic tension-type headache, not intractable: Secondary | ICD-10-CM | POA: Diagnosis not present

## 2020-09-03 DIAGNOSIS — G40909 Epilepsy, unspecified, not intractable, without status epilepticus: Secondary | ICD-10-CM

## 2020-09-03 DIAGNOSIS — F445 Conversion disorder with seizures or convulsions: Secondary | ICD-10-CM

## 2020-09-03 MED ORDER — LAMOTRIGINE 100 MG PO TABS: ORAL_TABLET | ORAL | 11 refills | Status: DC

## 2020-09-03 MED ORDER — SERTRALINE HCL 50 MG PO TABS: ORAL_TABLET | ORAL | 11 refills | Status: DC

## 2020-09-03 MED ORDER — LEVETIRACETAM 500 MG PO TABS: ORAL_TABLET | ORAL | 11 refills | Status: DC

## 2020-09-03 MED ORDER — FYCOMPA 4 MG PO TABS: ORAL_TABLET | ORAL | 5 refills | Status: DC

## 2020-09-03 NOTE — Progress Notes (Signed)
NEUROLOGY CONSULTATION NOTE  [facility]inda [facility]on MRN: [id number] DOB: Mar 02, 2001  Referring provider: Elveria Rising, NP Primary care provider: Dr. Ivory Broad  Reason for consult:  Establish adult epilepsy care  Thank you for your kind referral of Melinda [facility]on for consultation of the above symptoms. Although her history is well known to you, please allow me to reiterate it for the purpose of our medical record. The patient was accompanied to the clinic by her mother who also provides collateral information. Records and images were personally reviewed where available.   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 She was previously followed by Child Neurology, records were reviewed. When she was seen by Dr. Sharene Skeans in 2015, concern for PNES was raised. Seizures started at age 44, she reports initially there was no prior warning, however seizures have changed, they have become stronger over time. She usually starts having a pain in her head, right leg then arm becomes numb, then she feels herself staring off and would lose consciousness, falling to the ground. Sometimes she tastes blood before a seizure. She has seen her right arm and leg jerking. She has had urinary incontinence and tongue bite with the seizures. She has been on Lamotrigine 200mg  BID and Levetiracetam 500mg  BID for many years, Fycompa was added in 2019 due to continued report of daily seizures. They report a seizure every other day, last seizure was a week ago. Sometimes she would have 3 seizures in a day. She would tell her mother she starts feeling bad then starts shaking. After a seizure, there is no focal weakness, her head is "on fire." Her mother also reports nocturnal seizures where they would hear  something in her room, last nocturnal seizure was 2 weeks ago. Her mother notes the seizures look different now, her head would go back, hands are twisting, and she is in another world for 15 minutes. Her mother reports she stares off a lot. They recall when she was in the 11th grade, they had to take her out of school for 2 years because she had 5-6 back to back. One time they found her underwater in the tub when she had a seizure. Family now does not let her take showers/baths alone.   She has had daily headaches since age 9 with sharp stabbing pain "all the time." When more severe, she has vomiting, photo/phonophobia. She takes Tylenol or Advil, but states she does not take them daily. Her mother has migraines. She feels dizzy all the time. There is occasional blurred vision. No dysarthria/dysphagia, neck/back pain, bowel/bladder dysfunction. She lives with her mother who administers medications. She denies any side effects on medications. Her mother started managing medications after she tried to overdose on medications at one point. Her mother reports the seizures make her mean, frustrated.   Epilepsy Risk Factors:  Her paternal grandfather, maternal grandfather, paternal aunt and uncle had seizures. She had a normal birth and early development.  There is no history of febrile convulsions, CNS infections such as meningitis/encephalitis, significant traumatic brain injury, neurosurgical procedures  Laboratory Data:  Lab Results  Component Value Date   WBC 12.0 (H) 06/28/2020   HGB 13.7 06/28/2020   HCT 40.4 06/28/2020   MCV 93.1 06/28/2020   PLT 429 (H) 06/28/2020     Chemistry  Component Value Date/Time   NA 144 06/28/2020 0825   K 3.6 06/28/2020 0825   CL 107 06/28/2020 0825   CO2 21 (L) 06/28/2020 0825   BUN 9 06/28/2020 0825   CREATININE 0.72 06/28/2020 0825      Component Value Date/Time   CALCIUM 9.7 06/28/2020 0825   ALKPHOS 56 06/28/2020 0825   AST 49 (H) 06/28/2020 0825     ALT 46 (H) 06/28/2020 0825   BILITOT 0.5 06/28/2020 0825      EEGs: EEG in 2012: normal wake EEG 24-hour EEG in 01/2016: normal. She had an episode of feeling sick, dizzy, palpitations, pain, nausea. She had seizures "but caught them" and an episode of fainting. There were no EEG changes with these episodes.   MRI: none available for review. CT head in 2012 unremarkable.   PAST MEDICAL HISTORY: Past Medical History:  Diagnosis Date  . Anxiety   . GERD (gastroesophageal reflux disease)   . Seizures (HCC)     PAST SURGICAL HISTORY: History reviewed. No pertinent surgical history.  MEDICATIONS: Current Outpatient Medications on File Prior to Visit  Medication Sig Dispense Refill  . cetirizine (ZYRTEC) 10 MG tablet Take 10 mg by mouth daily as needed for allergies.   2  . dicyclomine (BENTYL) 20 MG tablet Take 1 tablet (20 mg total) by mouth 2 (two) times daily. 20 tablet 0  . FYCOMPA 4 MG TABS Take 1 tablet at bedtime 30 tablet 2  . ibuprofen (ADVIL,MOTRIN) 200 MG tablet Take 400 mg by mouth daily as needed for moderate pain.     [name]. lamoTRIgine (LAMICTAL) 100 MG tablet Take 2 tablets by mouth twice per day 124 tablet 2  . levETIRAcetam (KEPPRA) 500 MG tablet Take 2 tablets twice per day 124 tablet 2  . promethazine (PHENERGAN) 25 MG suppository Place 1 suppository (25 mg total) rectally every 6 (six) hours as needed for nausea or vomiting. 12 each 0  . promethazine (PHENERGAN) 25 MG tablet Take 1 tablet (25 mg total) by mouth every 6 (six) hours as needed for nausea or vomiting. 30 tablet 0  . sertraline (ZOLOFT) 50 MG tablet Take 1 tablet each day 30 tablet 3   No current facility-administered medications on file prior to visit.    ALLERGIES: No Known Allergies  FAMILY HISTORY: Family History  Problem Relation Age of Onset  . Hypertension Mother   . Cancer Maternal Grandmother        Died at [phone]  . Cancer Paternal Grandmother        Died at 5961  . Epilepsy Maternal  Grandfather   . Cirrhosis Maternal Grandfather        Died at 5459  . Seizures Paternal Grandfather        Died at 3759  . Epilepsy Paternal Aunt     SOCIAL HISTORY: Social History   Socioeconomic History  . Marital status: Single    Spouse name: Not on file  . Number of children: Not on file  . Years of education: Not on file  . Highest education level: Not on file  Occupational History  . Occupation: not working  Tobacco Use  . Smoking status: Passive Smoke Exposure - Never Smoker  . Smokeless tobacco: Never Used  . Tobacco comment: family smokes outside  Substance and Sexual Activity  . Alcohol use: No    Alcohol/week: 0.0 standard drinks  . Drug use: No  . Sexual activity: Never  Other Topics Concern  . Not on  file  Social History Narrative   Kirat recently graduated HS; she will be attending [employer]   She lives with her parents and sibling.    She enjoys watching TV and being on her phone.   Right handed   Drinks occasionally caffeine   One story home   Social Determinants of Health   Financial Resource Strain:   . Difficulty of Paying Living Expenses: Not on file  Food Insecurity:   . Worried About Programme researcher, broadcasting/film/video in the Last Year: Not on file  . Ran Out of Food in the Last Year: Not on file  Transportation Needs:   . Lack of Transportation (Medical): Not on file  . Lack of Transportation (Non-Medical): Not on file  Physical Activity:   . Days of Exercise per Week: Not on file  . Minutes of Exercise per Session: Not on file  Stress:   . Feeling of Stress : Not on file  Social Connections:   . Frequency of Communication with Friends and Family: Not on file  . Frequency of Social Gatherings with Friends and Family: Not on file  . Attends Religious Services: Not on file  . Active Member of Clubs or Organizations: Not on file  . Attends Banker Meetings: Not on file  . Marital Status: Not on file  Intimate Partner Violence:   . Fear of  Current or Ex-Partner: Not on file  . Emotionally Abused: Not on file  . Physically Abused: Not on file  . Sexually Abused: Not on file     PHYSICAL EXAM: Vitals:   09/03/20 1038  BP: 138/88  Pulse: 74  Resp: 18  SpO2: 98%   General: No acute distress, flat affect Head:  Normocephalic/atraumatic Skin/Extremities: No rash, no edema Neurological Exam: Mental status: alert and oriented to person, place, and time, no dysarthria or aphasia, Fund of knowledge is appropriate.  Recent and remote memory are intact.  Attention and concentration are normal.  Cranial nerves: CN I: not tested CN II: pupils equal, round and reactive to light, visual fields intact CN III, IV, VI:  full range of motion, no nystagmus, no ptosis CN V: facial sensation intact CN VII: upper and lower face symmetric CN VIII: hearing intact to conversation Bulk & Tone: normal, no fasciculations. Motor: 5/5 throughout with no pronator drift. Sensation: intact to light touch, cold, pin on both UE, decreased pin and vibration sense on left LE. Romberg test negative Deep Tendon Reflexes: +2 throughout Cerebellar: no incoordination on finger to nose testing Gait: narrow-based and steady, able to tandem walk adequately. 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 She and her mother continue to report seizures every other day on 3 AEDs. Suspicion for PNES has been raised in the past. We discussed different types of seizures, recommend doing another routine EEG followed by a 72-hour EEG to further classify events. She may need inpatient EMU admission in the future. Continue Lamotrigine 200mg  BID, Levetiracetam 500mg  BID, and Fycompa 4mg  qhs for now. She does not drive. Behavioral Health evaluation again  discussed today to manage mood/sleep issues, her mother will continue to look into it. Follow-up after tests, they know to call for any changes.    Thank you for allowing me to participate in the care of this patient. Please do not hesitate  to call for any questions or concerns.   Patrcia Dolly, M.D.  CC: Dr. Sabino Dick, Elveria Rising, NP

## 2020-09-03 NOTE — Patient Instructions (Addendum)
1. Schedule MRI brain with and without contrast  2. Schedule routine EEG, then schedule 3-day EEG  3. Continue all your seizure medications  4. Proceed with seeing a psychiatrist to help with sleep and mood  5. Follow-up after tests, call for any changes   Seizure Precautions: 1. If medication has been prescribed for you to prevent seizures, take it exactly as directed.  Do not stop taking the medicine without talking to your doctor first, even if you have not had a seizure in a long time.   2. Avoid activities in which a seizure would cause danger to yourself or to others.  Don't operate dangerous machinery, swim alone, or climb in high or dangerous places, such as on ladders, roofs, or girders.  Do not drive unless your doctor says you may.  3. If you have any warning that you may have a seizure, lay down in a safe place where you can't hurt yourself.    4.  No driving for 6 months from last seizure, as per [facility].   Please refer to the following link on the Epile[employer] of [employer]'s website for more information: http://www.epilepsyfoundation.org/answerplace/Social/driving/drivingu.cfm   5.  Maintain good sleep hygiene. Avoid alcohol.  6.  Notify your neurology if you are planning pregnancy or if you become pregnant.  7.  Contact your doctor if you have any problems that may be related to the medicine you are taking.  8.  Call 911 and bring the patient back to the ED if:        A.  The seizure lasts longer than 5 minutes.       B.  The patient doesn't awaken shortly after the seizure  C.  The patient has new problems such as difficulty seeing, speaking or moving  D.  The patient was injured during the seizure  E.  The patient has a temperature over 102 F (39C)  F.  The patient vomited and now is having trouble breathing        We have sent a referral to [facility] for your MRI and they will call you directly to schedule your appointment. They are  located at [address] [facility]. If you need to contact them directly please call [phone].

## 2020-09-05 NOTE — Telephone Encounter (Signed)
Already taken care of and faxed to pharmacy

## 2020-09-12 ENCOUNTER — Ambulatory Visit (INDEPENDENT_AMBULATORY_CARE_PROVIDER_SITE_OTHER): Payer: Medicaid Other | Admitting: Neurology

## 2020-09-12 ENCOUNTER — Other Ambulatory Visit: Payer: Self-pay

## 2020-09-12 DIAGNOSIS — G40909 Epilepsy, unspecified, not intractable, without status epilepticus: Secondary | ICD-10-CM

## 2020-09-19 NOTE — Procedures (Signed)
ELECTROENCEPHALOGRAM REPORT  Date of Study: 09/12/2020  Patient's Name: Melinda Calderon MRN: [id number] Date of Birth: Apr 12, 2001  Referring Provider: Dr. Patrcia Dolly  Clinical History: This is a 19 year old woman with a history of generalized epilepsy in childhood and possible psychogenic events with near daily seizures on 3 AEDs.   Medications: FYCOMPA 4 MG TABS LAMICTAL 100 MG tablet KEPPRA 500 MG tablet ZYRTEC 10 MG tablet BENTYL 20 MG tablet ADVIL,MOTRIN 200 MG tablet PHENERGAN 25 MG suppository PHENERGAN 25 MG tablet ZOLOFT 50 MG tablet  Technical Summary: A multichannel digital EEG recording measured by the international 10-20 system with electrodes applied with paste and impedances below 5000 ohms performed in our laboratory with EKG monitoring in an awake and asleep patient.  Hyperventilation was not performed. Photic stimulation was performed.  The digital EEG was referentially recorded, reformatted, and digitally filtered in a variety of bipolar and referential montages for optimal display.    Description: The patient is awake and asleep during the recording.  During maximal wakefulness, there is a symmetric, medium voltage 9 Hz posterior dominant rhythm that attenuates with eye opening.  The record is symmetric.  During drowsiness and stage I sleep, there is an increase in theta slowing of the background with vertex waves seen.  Photic stimulation did not elicit any abnormalities.  There were no epileptiform discharges or electrographic seizures seen.    EKG lead was unremarkable.  Impression: This awake and asleep EEG is normal.    Clinical Correlation: A normal EEG does not exclude a clinical diagnosis of epilepsy.  If further clinical questions remain, prolonged EEG may be helpful.  Clinical correlation is advised.   Patrcia Dolly, M.D.

## 2020-10-07 ENCOUNTER — Emergency Department (HOSPITAL_COMMUNITY)
Admission: EM | Admit: 2020-10-07 | Discharge: 2020-10-07 | Disposition: A | Discharge status: Home / Self Care | Payer: Medicaid Other | Attending: Emergency Medicine | Admitting: Emergency Medicine

## 2020-10-07 ENCOUNTER — Other Ambulatory Visit: Payer: Self-pay

## 2020-10-07 DIAGNOSIS — R112 Nausea with vomiting, unspecified: Secondary | ICD-10-CM | POA: Diagnosis not present

## 2020-10-07 DIAGNOSIS — R1114 Bilious vomiting: Secondary | ICD-10-CM

## 2020-10-07 DIAGNOSIS — Z7722 Contact with and (suspected) exposure to environmental tobacco smoke (acute) (chronic): Secondary | ICD-10-CM | POA: Diagnosis not present

## 2020-10-07 DIAGNOSIS — R1013 Epigastric pain: Secondary | ICD-10-CM | POA: Diagnosis not present

## 2020-10-07 LAB — COMPREHENSIVE METABOLIC PANEL
ALT: 32 U/L (ref 0–44)
AST: 37 U/L (ref 15–41)
Albumin: 5.4 g/dL — ABNORMAL HIGH (ref 3.5–5.0)
Alkaline Phosphatase: 52 U/L (ref 38–126)
Anion gap: 16 — ABNORMAL HIGH (ref 5–15)
BUN: 15 mg/dL (ref 6–20)
CO2: 19 mmol/L — ABNORMAL LOW (ref 22–32)
Calcium: 9.9 mg/dL (ref 8.9–10.3)
Chloride: 104 mmol/L (ref 98–111)
Creatinine, Ser: 0.62 mg/dL (ref 0.44–1.00)
GFR, Estimated: >60 mL/min (ref >60)
Glucose, Bld: 133 mg/dL — ABNORMAL HIGH (ref 70–99)
Potassium: 3.1 mmol/L — ABNORMAL LOW (ref 3.5–5.1)
Sodium: 139 mmol/L (ref 135–145)
Total Bilirubin: 0.6 mg/dL (ref 0.3–1.2)
Total Protein: 8.2 g/dL — ABNORMAL HIGH (ref 6.5–8.1)

## 2020-10-07 LAB — CBC WITH DIFFERENTIAL/PLATELET
Abs Immature Granulocytes: 0.05 10*3/uL (ref 0.00–0.07)
Basophils Absolute: 0.1 10*3/uL (ref 0.0–0.1)
Basophils Relative: 0 %
Eosinophils Absolute: 0 10*3/uL (ref 0.0–0.5)
Eosinophils Relative: 0 %
HCT: 39.8 % (ref 36.0–46.0)
Hemoglobin: 13.6 g/dL (ref 12.0–15.0)
Immature Granulocytes: 0 %
Lymphocytes Relative: 9 %
Lymphs Abs: 1.4 10*3/uL (ref 0.7–4.0)
MCH: 31.5 pg (ref 26.0–34.0)
MCHC: 34.2 g/dL (ref 30.0–36.0)
MCV: 92.1 fL (ref 80.0–100.0)
Monocytes Absolute: 0.4 10*3/uL (ref 0.1–1.0)
Monocytes Relative: 2 %
Neutro Abs: 14.3 10*3/uL — ABNORMAL HIGH (ref 1.7–7.7)
Neutrophils Relative %: 89 %
Platelets: 331 10*3/uL (ref 150–400)
RBC: 4.32 MIL/uL (ref 3.87–5.11)
RDW: 13.4 % (ref 11.5–15.5)
WBC: 16.2 10*3/uL — ABNORMAL HIGH (ref 4.0–10.5)
nRBC: 0 % (ref 0.0–0.2)

## 2020-10-07 LAB — LIPASE, BLOOD: Lipase: 21 U/L (ref 11–51)

## 2020-10-07 MED ORDER — SODIUM CHLORIDE 0.9 % IV BOLUS
1000.0000 mL | Freq: Once | INTRAVENOUS | Status: AC
  Administered 2020-10-07: 1000 mL via INTRAVENOUS

## 2020-10-07 MED ORDER — FAMOTIDINE 20 MG PO TABS: 20.0000 mg | ORAL_TABLET | Freq: Two times a day (BID) | ORAL | 0 refills | Status: DC

## 2020-10-07 MED ORDER — LEVETIRACETAM IN NACL 500 MG/100ML IV SOLN
500.0000 mg | Freq: Two times a day (BID) | INTRAVENOUS | Status: DC
  Administered 2020-10-07: 500 mg via INTRAVENOUS
  Filled 2020-10-07: qty 100

## 2020-10-07 MED ORDER — SUCRALFATE 1 G PO TABS: 1.0000 g | ORAL_TABLET | Freq: Three times a day (TID) | ORAL | 0 refills | Status: DC

## 2020-10-07 MED ORDER — DROPERIDOL 2.5 MG/ML IJ SOLN
1.2500 mg | Freq: Once | INTRAMUSCULAR | Status: AC
  Administered 2020-10-07: 1.25 mg via INTRAVENOUS
  Filled 2020-10-07: qty 2

## 2020-10-07 MED ORDER — SUCRALFATE 1 GM/10ML PO SUSP
1.0000 g | Freq: Once | ORAL | Status: AC
  Administered 2020-10-07: 1 g via ORAL
  Filled 2020-10-07: qty 10

## 2020-10-07 MED ORDER — PROMETHAZINE HCL 25 MG/ML IJ SOLN
25.0000 mg | Freq: Once | INTRAMUSCULAR | Status: DC
  Filled 2020-10-07: qty 1

## 2020-10-07 MED ORDER — FAMOTIDINE IN NACL 20-0.9 MG/50ML-% IV SOLN
20.0000 mg | Freq: Once | INTRAVENOUS | Status: AC
  Administered 2020-10-07: 20 mg via INTRAVENOUS
  Filled 2020-10-07: qty 50

## 2020-10-07 NOTE — ED Notes (Signed)
Pt eating crackers and drinking ginger ale, no n/v at this time

## 2020-10-07 NOTE — ED Provider Notes (Signed)
[facility]-EMERGENCY DEPT Provider Note   CSN: [id number] Arrival date & time: 10/07/20  1534     History Chief Complaint  Patient presents with  . Emesis  . Nausea    Melinda Calderon is a 19 y.o. female.  HPI  Patient presents via EMS with her mother.  History is obtained by the patient, her mother and EMS providers. Patient has a history of seizures, anxiety, episodic nausea, vomiting.  She went to bed in her usual state of health, but today, not long after awakening developed nausea, vomiting, and some epigastric discomfort.  Since that time symptoms have been persistent.  She has been unable to tolerate oral intake, including meds/antiepileptics, antiemetics. No fever, no diarrhea, no dysuria.  No seizure activity. EMS providers note they were unable to obtain IV access, and the patient did not change with intramuscular Zofran.   Past Medical History:  Diagnosis Date  . Anxiety   . GERD (gastroesophageal reflux disease)   . Seizures [facility])     Patient Active Problem List   Diagnosis Date Noted  . Arthralgia 08/19/2019  . Low back pain 08/19/2019  . 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 Panic disorder 10/23/2015  . Difficulty controlling anger 09/13/2015  . Dysphonia 09/12/2015  . Episodic tension-type headache, not intractable 02/15/2015  . Syncope and collapse 02/15/2015  . Migraine without aura 02/15/2015  . Lack of adequate sleep 02/15/2015  . 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 Encounter for long-term current use of medication 02/22/2014  . Seizure disorder (HCC) 12/27/2011  . Seizure (HCC) 12/27/2011  . Headache(784.0) 12/27/2011    No past surgical history on file.   OB History   No obstetric history on file.     Family History  Problem Relation Age of Onset  . Hypertension Mother   . Cancer Maternal Grandmother        Died at 29  . Cancer Paternal  Grandmother        Died at 27  . Epilepsy Maternal Grandfather   . Cirrhosis Maternal Grandfather        Died at 88  . Seizures Paternal Grandfather        Died at 66  . Epilepsy Paternal Aunt     Social History   Tobacco Use  . Smoking status: Passive Smoke Exposure - Never Smoker  . Smokeless tobacco: Never Used  . Tobacco comment: family smokes outside  Substance Use Topics  . Alcohol use: No    Alcohol/week: 0.0 standard drinks  . Drug use: No    Home Medications Prior to Admission medications   Medication Sig Start Date End Date Taking? Authorizing Provider  cetirizine (ZYRTEC) 10 MG tablet Take 10 mg by mouth daily as needed for allergies.  01/26/18  Yes [provider]  dicyclomine (BENTYL) 20 MG tablet Take 1 tablet (20 mg total) by mouth 2 (two) times daily. 06/28/20  Yes Joy, Shawn C, PA-C  FYCOMPA 4 MG TABS Take 1 tablet at bedtime Patient taking differently: Take 4 mg by mouth at bedtime.  09/03/20  Yes Van Clines, MD  ibuprofen (ADVIL,MOTRIN) 200 MG tablet Take 400 mg by mouth daily as needed for moderate pain.    Yes [provider]  lamoTRIgine (LAMICTAL) 100 MG tablet Take 2 tablets by mouth twice per day Patient taking differently: Take 100 mg by mouth 2 (two) times daily.  09/03/20  Yes Van Clines,  MD  levETIRAcetam (KEPPRA) 500 MG tablet Take 2 tablets twice per day Patient taking differently: Take 500 mg by mouth 2 (two) times daily.  09/03/20  Yes Van Clines, MD  promethazine (PHENERGAN) 25 MG suppository Place 1 suppository (25 mg total) rectally every 6 (six) hours as needed for nausea or vomiting. 06/28/20  Yes Joy, Shawn C, PA-C  promethazine (PHENERGAN) 25 MG tablet Take 1 tablet (25 mg total) by mouth every 6 (six) hours as needed for nausea or vomiting. 06/28/20  Yes Joy, Shawn C, PA-C  sertraline (ZOLOFT) 50 MG tablet Take 1 tablet each day Patient taking differently: Take 50 mg by mouth at bedtime.  09/03/20  Yes Van Clines, MD    Allergies    Patient has no known allergies.  Review of Systems   Review of Systems  Constitutional:       Per HPI, otherwise negative  HENT:       Per HPI, otherwise negative  Respiratory:       Per HPI, otherwise negative  Cardiovascular:       Per HPI, otherwise negative  Gastrointestinal: Positive for abdominal pain, nausea and vomiting.  Endocrine:       Negative aside from HPI  Genitourinary:       Neg aside from HPI   Musculoskeletal:       Per HPI, otherwise negative  Skin: Negative.   Neurological: Positive for seizures and weakness. Negative for syncope.    Physical Exam Updated Vital Signs BP (!) 141/91   Pulse 81   Temp 97.6 F (36.4 C) (Oral)   Resp (!) 22   SpO2 100%   Physical Exam Vitals and nursing note reviewed.  Constitutional:      General: She is not in acute distress.    Appearance: She is well-developed.  HENT:     Head: Normocephalic and atraumatic.  Eyes:     Conjunctiva/sclera: Conjunctivae normal.  Cardiovascular:     Rate and Rhythm: Normal rate and regular rhythm.  Pulmonary:     Effort: Pulmonary effort is normal. No respiratory distress.     Breath sounds: Normal breath sounds. No stridor.  Abdominal:     General: There is no distension.     Tenderness: There is abdominal tenderness in the epigastric area. Negative signs include Murphy's sign and McBurney's sign.  Skin:    General: Skin is warm and dry.  Neurological:     Mental Status: She is alert and oriented to person, place, and time.     Cranial Nerves: No cranial nerve deficit.     ED Results / Procedures / Treatments   Labs (all labs ordered are listed, but only abnormal results are displayed) Labs Reviewed  COMPREHENSIVE METABOLIC PANEL - Abnormal; Notable for the following components:      Result Value   Potassium 3.1 (*)    CO2 19 (*)    Glucose, Bld 133 (*)    Total Protein 8.2 (*)    Albumin 5.4 (*)    Anion gap 16 (*)    All other  components within normal limits  CBC WITH DIFFERENTIAL/PLATELET - Abnormal; Notable for the following components:   WBC 16.2 (*)    Neutro Abs 14.3 (*)    All other components within normal limits  LIPASE, BLOOD  URINALYSIS, ROUTINE W REFLEX MICROSCOPIC  ETHANOL    EKG None  Radiology No results found.  Procedures Procedures (including critical care time)  Medications Ordered in  ED Medications  levETIRAcetam (KEPPRA) IVPB 500 mg/100 mL premix (0 mg Intravenous Stopped 10/07/20 1712)  sodium chloride 0.9 % bolus 1,000 mL (0 mLs Intravenous Stopped 10/07/20 1815)  droperidol (INAPSINE) 2.5 MG/ML injection 1.25 mg (1.25 mg Intravenous Given 10/07/20 1639)  sucralfate (CARAFATE) 1 GM/10ML suspension 1 g (1 g Oral Given 10/07/20 1716)  famotidine (PEPCID) IVPB 20 mg premix (0 mg Intravenous Stopped 10/07/20 1815)    ED Course  I have reviewed the triage vital signs and the nursing notes.  Pertinent labs & imaging results that were available during my care of the patient were reviewed by me and considered in my medical decision making (see chart for details).  Update: Patient no longer vomiting, initial interventions including droperidol, fluids provided. MDM Rules/Calculators/A&P                          6:26 PM Patient has tolerated liquids, crackers.  With her mother I discussed today's presentation, need for ongoing outpatient evaluation, but given her substantial improvement here resolution of vomiting, absence of hemodynamic instability, history of prior similar episodes, without substantial lab abnormalities beyond some mild electrolyte changes such as hypokalemia, with suspicion for anion gap pain secondary to her active nausea, vomiting, now following resuscitation with fluids, droperidol, Sucralfate, Pepcid, the patient is discharged in stable condition. Final Clinical Impression(s) / ED Diagnoses Final diagnoses:  Bilious vomiting with nausea    Rx / DC Orders ED  Discharge Orders         Ordered    sucralfate (CARAFATE) 1 g tablet  3 times daily with meals & bedtime       Note to Pharmacy: Take for one week   10/07/20 1831    famotidine (PEPCID) 20 MG tablet  2 times daily        10/07/20 1831           Gerhard Munch, MD 10/07/20 1831

## 2020-10-07 NOTE — ED Triage Notes (Signed)
Pt arrived [facility] from [facility]. Pt HX seizures. Pt n/v diarrhea. Pt stated she feels like something in her chest and stuck her finger down her throat to throw up. EMS told her not to do that again due to long finger nails.  4mg  zofran given IM while in route  Vitals 126/90 HR 94  Temp 97.7  Resp 8    Pt has not had seizure meds today

## 2020-10-07 NOTE — Discharge Instructions (Signed)
As discussed, your evaluation today has been largely reassuring.  But, it is important that you monitor your condition carefully, and do not hesitate to return to the ED if you develop new, or concerning changes in your condition. ? ?Otherwise, please follow-up with your physician for appropriate ongoing care. ? ?

## 2020-10-15 ENCOUNTER — Other Ambulatory Visit: Payer: Self-pay

## 2020-10-15 ENCOUNTER — Ambulatory Visit (INDEPENDENT_AMBULATORY_CARE_PROVIDER_SITE_OTHER): Payer: Medicaid Other | Admitting: Neurology

## 2020-10-15 DIAGNOSIS — G40909 Epilepsy, unspecified, not intractable, without status epilepticus: Secondary | ICD-10-CM

## 2020-10-31 ENCOUNTER — Telehealth: Payer: Self-pay

## 2020-10-31 NOTE — Telephone Encounter (Signed)
Pt called to go over EEG results no answer no voice mail set up will try to call again later

## 2020-10-31 NOTE — Telephone Encounter (Signed)
-----   Message from Van Clines, MD sent at 10/29/2020  4:30 PM EST ----- Regarding: EEG results Pls let patient/mother know that the prolonged home EEG was normal. At this point, recommendation is an inpatient EEG study where she is the hospital for several days and we can capture her seizures so we can treat them accordingly. From the home EEG results, would stay on the same doses of her medications. If they are agreeable to inpatient, pls send referral to EMU. Thanks!

## 2020-11-01 NOTE — Telephone Encounter (Signed)
Called pt cell number and home number no answer and unable to leave a voice mail called pt mother no answer unable to leave a voice mail.

## 2020-11-02 NOTE — Telephone Encounter (Signed)
Pt called unable to leave a voice mail  

## 2020-11-02 NOTE — Telephone Encounter (Signed)
My chart message sent to pt.

## 2020-12-03 NOTE — Procedures (Signed)
ELECTROENCEPHALOGRAM REPORT  Dates of Recording: 10/15/2020 10:10AM to 10/17/2020 5:16PM  Patient's Name: Melinda Calderon MRN: [id number] Date of Birth: Aug 27, 2001  Referring Provider: Dr. Patrcia Dolly  Procedure: 55-hour ambulatory video EEG  History: This is a 20 year old woman with recurrent seizures on 3 AEDs. EEG for classification.  Medications:  LAMICTAL 100 MG tablet KEPPRA 500 MG tablet FYCOMPA 4 MG TABS ZYRTEC 10 MG tablet BENTYL 20 MG tablet ADVIL,MOTRIN 200 MG tablet PHENERGAN 25 MG suppository PHENERGAN 25 MG tablet ZOLOFT 50 MG tablet  Technical Summary: This is a 55-hour multichannel digital video EEG recording measured by the international 10-20 system with electrodes applied with paste and impedances below 5000 ohms performed as portable with EKG monitoring.  The digital EEG was referentially recorded, reformatted, and digitally filtered in a variety of bipolar and referential montages for optimal display.    DESCRIPTION OF RECORDING: During maximal wakefulness, the background activity consisted of a symmetric 10 Hz posterior dominant rhythm which was reactive to eye opening.  There were no epileptiform discharges or focal slowing seen in wakefulness.  During the recording, the patient progresses through wakefulness, drowsiness, and Stage 2 sleep.  Again, there were no epileptiform discharges seen.  Events: Patient filled out diary with symptoms but did not write time of events.   On 11/15 at 1305 hours, there are 4 push button events. She is sitting on couch and holds on to her stomach. She reports stomach pain/nausea on diary. Electrographically, there were no EEG or EKG changes seen.  On 11/15 at 1306 hours, there are 9 PB events. Patient is sitting on couch, no clinical changes seen. She reports heart pain and back pain on diary. Electrographically, there were no EEG or EKG changes seen.  On 11/15 at 1310 hours, she is lying on her side on couch and has 3  PBs. No clinical changes seen. Electrographically, there were no EEG or EKG changes seen.  On 11/15 at 17 hours, she pushes button. Patient sitting on couch with no clinical changes seen. Electrographically, there were no EEG or EKG changes seen.  On 11/15 at 2010 hours, she is holding her head and appears in pain. She reports a real bad headache on diary. Electrographically, there were no EEG or EKG changes seen.  On 11/15, she notes heart pain, back pain but did not note time.  On 11/16 at 1419 hours, there are 2 PB events. Patient not on video. Electrographically, there were no EEG or EKG changes seen.  On 11/16 at 1430 hours, there are 19 PB events, patient not on video. Patient reports a seizure on diary. Electrographically, there were no EEG or EKG changes seen.  On 11/16 at 1603 hours, there is a PB event. Patient not on video. Electrographically, there were no EEG or EKG changes seen.  On 11/16 at 1930 hours, patient has 1 PB events. No clinical changes seen while sitting on couch. Electrographically, there were no EEG or EKG changes seen.  On 11/16 at 1940 hours, there is 1 PB event, patient not on video. Electrographically, there were no EEG or EKG changes seen.  On 11/16, Patient reports seizure, sore throat out of nowhere, numbness in hand, shaking hands but did not note time.  On 11/17 at 1519 hours, she is lying on bed covered by pillow then suddenly sits up and reaches for push button. She holds her head then appears to be watching TV. There is a diary entry for 11/17 reporting change in  emotion, shortness of breath, not feeling good, 7 seizures. Electrographically, there were no EEG or EKG changes seen.  On 11/17 at 1521 hours, she is sitting on the bed listening to music and appears emotional, crying. There is a diary entry noting change in emotion, everything look blurry in right eye, a lot of anger. Electrographically, there were no EEG or EKG changes seen.  On 11/17 at  1628 hours, she is sitting on couch leaning on family member and pushes button. No clinical changes seen. Electrographically, there were no EEG or EKG changes seen. Last diary entry notes "my mind started to play tricks on me."  There were no electrographic seizures seen.  EKG lead was unremarkable.  IMPRESSION: This 55-hour ambulatory video EEG study is normal. Episodes where patient reports seizures, headaches, nausea, stomach/heart/back pain, numbness in hand, shaking hands, did not show epileptiform correlate.  CLINICAL CORRELATION: A normal EEG does not exclude a clinical diagnosis of epilepsy. Episodes noted above were non-epileptic. If further clinical questions remain, inpatient video EEG monitoring may be helpful.   Patrcia Dolly, M.D.

## 2020-12-31 ENCOUNTER — Emergency Department (HOSPITAL_COMMUNITY): Payer: Medicaid Other

## 2020-12-31 ENCOUNTER — Other Ambulatory Visit: Payer: Self-pay

## 2020-12-31 ENCOUNTER — Encounter (HOSPITAL_COMMUNITY): Payer: Self-pay | Admitting: Emergency Medicine

## 2020-12-31 ENCOUNTER — Emergency Department (HOSPITAL_COMMUNITY)
Admission: EM | Admit: 2020-12-31 | Discharge: 2020-12-31 | Disposition: A | Discharge status: Home / Self Care | Payer: Medicaid Other | Attending: Emergency Medicine | Admitting: Emergency Medicine

## 2020-12-31 DIAGNOSIS — K219 Gastro-esophageal reflux disease without esophagitis: Secondary | ICD-10-CM | POA: Insufficient documentation

## 2020-12-31 DIAGNOSIS — R112 Nausea with vomiting, unspecified: Secondary | ICD-10-CM | POA: Insufficient documentation

## 2020-12-31 DIAGNOSIS — Z20822 Contact with and (suspected) exposure to covid-19: Secondary | ICD-10-CM | POA: Diagnosis not present

## 2020-12-31 DIAGNOSIS — Z7722 Contact with and (suspected) exposure to environmental tobacco smoke (acute) (chronic): Secondary | ICD-10-CM | POA: Insufficient documentation

## 2020-12-31 DIAGNOSIS — R0789 Other chest pain: Secondary | ICD-10-CM | POA: Diagnosis present

## 2020-12-31 DIAGNOSIS — R1013 Epigastric pain: Secondary | ICD-10-CM

## 2020-12-31 LAB — COMPREHENSIVE METABOLIC PANEL
ALT: 31 U/L (ref 0–44)
AST: 31 U/L (ref 15–41)
Albumin: 5.3 g/dL — ABNORMAL HIGH (ref 3.5–5.0)
Alkaline Phosphatase: 57 U/L (ref 38–126)
Anion gap: 18 — ABNORMAL HIGH (ref 5–15)
BUN: 24 mg/dL — ABNORMAL HIGH (ref 6–20)
CO2: 23 mmol/L (ref 22–32)
Calcium: 10.4 mg/dL — ABNORMAL HIGH (ref 8.9–10.3)
Chloride: 95 mmol/L — ABNORMAL LOW (ref 98–111)
Creatinine, Ser: 0.8 mg/dL (ref 0.44–1.00)
GFR, Estimated: >60 mL/min (ref >60)
Glucose, Bld: 119 mg/dL — ABNORMAL HIGH (ref 70–99)
Potassium: 3.9 mmol/L (ref 3.5–5.1)
Sodium: 136 mmol/L (ref 135–145)
Total Bilirubin: 0.7 mg/dL (ref 0.3–1.2)
Total Protein: 8.4 g/dL — ABNORMAL HIGH (ref 6.5–8.1)

## 2020-12-31 LAB — CBC WITH DIFFERENTIAL/PLATELET
Abs Immature Granulocytes: 0.05 10*3/uL (ref 0.00–0.07)
Basophils Absolute: 0 10*3/uL (ref 0.0–0.1)
Basophils Relative: 0 %
Eosinophils Absolute: 0 10*3/uL (ref 0.0–0.5)
Eosinophils Relative: 0 %
HCT: 44.8 % (ref 36.0–46.0)
Hemoglobin: 15.6 g/dL — ABNORMAL HIGH (ref 12.0–15.0)
Immature Granulocytes: 0 %
Lymphocytes Relative: 23 %
Lymphs Abs: 3.3 10*3/uL (ref 0.7–4.0)
MCH: 32.1 pg (ref 26.0–34.0)
MCHC: 34.8 g/dL (ref 30.0–36.0)
MCV: 92.2 fL (ref 80.0–100.0)
Monocytes Absolute: 0.7 10*3/uL (ref 0.1–1.0)
Monocytes Relative: 5 %
Neutro Abs: 10.2 10*3/uL — ABNORMAL HIGH (ref 1.7–7.7)
Neutrophils Relative %: 72 %
Platelets: 364 10*3/uL (ref 150–400)
RBC: 4.86 MIL/uL (ref 3.87–5.11)
RDW: 13.1 % (ref 11.5–15.5)
WBC: 14.3 10*3/uL — ABNORMAL HIGH (ref 4.0–10.5)
nRBC: 0 % (ref 0.0–0.2)

## 2020-12-31 LAB — LIPASE, BLOOD: Lipase: 28 U/L (ref 11–51)

## 2020-12-31 LAB — SARS CORONAVIRUS 2 (TAT 6-24 HRS): SARS Coronavirus 2: NEGATIVE

## 2020-12-31 LAB — I-STAT BETA HCG BLOOD, ED (MC, WL, AP ONLY): I-stat hCG, quantitative: 13.4 m[IU]/mL — ABNORMAL HIGH (ref <5)

## 2020-12-31 LAB — HCG, QUANTITATIVE, PREGNANCY: hCG, Beta Chain, Quant, S: 1 m[IU]/mL (ref <5)

## 2020-12-31 MED ORDER — ONDANSETRON 4 MG PO TBDP: 4.0000 mg | ORAL_TABLET | Freq: Three times a day (TID) | ORAL | 0 refills | Status: DC | PRN

## 2020-12-31 MED ORDER — ALUM & MAG HYDROXIDE-SIMETH 200-200-20 MG/5ML PO SUSP
30.0000 mL | Freq: Once | ORAL | Status: AC
  Administered 2020-12-31: 30 mL via ORAL
  Filled 2020-12-31: qty 30

## 2020-12-31 MED ORDER — LEVETIRACETAM IN NACL 1000 MG/100ML IV SOLN
1000.0000 mg | Freq: Once | INTRAVENOUS | Status: AC
  Administered 2020-12-31: 1000 mg via INTRAVENOUS
  Filled 2020-12-31: qty 100

## 2020-12-31 MED ORDER — OMEPRAZOLE 20 MG PO CPDR: 20.0000 mg | DELAYED_RELEASE_CAPSULE | Freq: Every day | ORAL | 0 refills | Status: DC

## 2020-12-31 MED ORDER — SODIUM CHLORIDE 0.9 % IV BOLUS
1000.0000 mL | Freq: Once | INTRAVENOUS | Status: AC
  Administered 2020-12-31: 1000 mL via INTRAVENOUS

## 2020-12-31 MED ORDER — SUCRALFATE 1 G PO TABS: 1.0000 g | ORAL_TABLET | Freq: Three times a day (TID) | ORAL | 0 refills | Status: DC

## 2020-12-31 MED ORDER — ONDANSETRON HCL 4 MG/2ML IJ SOLN
4.0000 mg | Freq: Once | INTRAMUSCULAR | Status: AC
  Administered 2020-12-31: 4 mg via INTRAVENOUS
  Filled 2020-12-31: qty 2

## 2020-12-31 MED ORDER — MORPHINE SULFATE (PF) 4 MG/ML IV SOLN
4.0000 mg | Freq: Once | INTRAVENOUS | Status: AC
  Administered 2020-12-31: 4 mg via INTRAVENOUS
  Filled 2020-12-31: qty 1

## 2020-12-31 MED ORDER — LIDOCAINE VISCOUS HCL 2 % MT SOLN
15.0000 mL | Freq: Once | OROMUCOSAL | Status: AC
  Administered 2020-12-31: 15 mL via ORAL
  Filled 2020-12-31: qty 15

## 2020-12-31 NOTE — ED Notes (Signed)
Patient waiting for ride due to lack of warm clothing

## 2020-12-31 NOTE — ED Provider Notes (Signed)
[facility]-EMERGENCY DEPT Provider Note   CSN: [id number] Arrival date & time: 12/31/20  0109     History Chief Complaint  Patient presents with  . Nausea  . Seizures    Non-epileptic    Melinda Calderon is a 20 y.o. female.  Patient presents to the emergency department with a chief complaint of anterior chest wall pain, epigastric abdominal pain and vomiting.  She states the symptoms have been going on for the past day or so.  She denies any fever, chills, cough.  She has history of nonepileptic seizures.  She takes Lamictal.  Per EMS, the family said she had a 20-minute seizure prior to their arrival.  There was no postictal phase.  EMS denied any convulsive activity while transporting the patient here.  Patient reports that she is vomited up some streaky blood.  She does have history of GERD.  She denies any lower abdominal pain.  Denies any other associated symptoms.  The history is provided by the patient. No language interpreter was used.       Past Medical History:  Diagnosis Date  . Anxiety   . GERD (gastroesophageal reflux disease)   . Seizures [facility])     Patient Active Problem List   Diagnosis Date Noted  . Arthralgia 08/19/2019  . Low back pain 08/19/2019  . 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 Panic disorder 10/23/2015  . Difficulty controlling anger 09/13/2015  . Dysphonia 09/12/2015  . Episodic tension-type headache, not intractable 02/15/2015  . Syncope and collapse 02/15/2015  . Migraine without aura 02/15/2015  . Lack of adequate sleep 02/15/2015  . 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 Encounter for long-term current use of medication 02/22/2014  . Seizure disorder (HCC) 12/27/2011  . Seizure (HCC) 12/27/2011  . Headache(784.0) 12/27/2011    History reviewed. No pertinent surgical history.   OB History   No obstetric history on file.     Family  History  Problem Relation Age of Onset  . Hypertension Mother   . Cancer Maternal Grandmother        Died at 60  . Cancer Paternal Grandmother        Died at 61  . Epilepsy Maternal Grandfather   . Cirrhosis Maternal Grandfather        Died at 55  . Seizures Paternal Grandfather        Died at 49  . Epilepsy Paternal Aunt     Social History   Tobacco Use  . Smoking status: Passive Smoke Exposure - Never Smoker  . Smokeless tobacco: Never Used  . Tobacco comment: family smokes outside  Substance Use Topics  . Alcohol use: No    Alcohol/week: 0.0 standard drinks  . Drug use: No    Home Medications Prior to Admission medications   Medication Sig Start Date End Date Taking? Authorizing Provider  cetirizine (ZYRTEC) 10 MG tablet Take 10 mg by mouth daily as needed for allergies.  01/26/18   [provider]  dicyclomine (BENTYL) 20 MG tablet Take 1 tablet (20 mg total) by mouth 2 (two) times daily. 06/28/20   Joy, Shawn C, PA-C  famotidine (PEPCID) 20 MG tablet Take 1 tablet (20 mg total) by mouth 2 (two) times daily. 10/07/20   Gerhard Munch, MD  FYCOMPA 4 MG TABS Take 1 tablet at bedtime Patient taking differently: Take 4 mg by mouth at bedtime.  09/03/20   Van Clines, MD  ibuprofen (ADVIL,MOTRIN) 200 MG tablet Take 400 mg by mouth daily as needed for moderate pain.     [provider]  lamoTRIgine (LAMICTAL) 100 MG tablet Take 2 tablets by mouth twice per day Patient taking differently: Take 100 mg by mouth 2 (two) times daily.  09/03/20   Van Clines, MD  levETIRAcetam (KEPPRA) 500 MG tablet Take 2 tablets twice per day Patient taking differently: Take 500 mg by mouth 2 (two) times daily.  09/03/20   Van Clines, MD  promethazine (PHENERGAN) 25 MG suppository Place 1 suppository (25 mg total) rectally every 6 (six) hours as needed for nausea or vomiting. 06/28/20   Joy, Shawn C, PA-C  promethazine (PHENERGAN) 25 MG tablet Take 1 tablet (25 mg total)  by mouth every 6 (six) hours as needed for nausea or vomiting. 06/28/20   Joy, Shawn C, PA-C  sertraline (ZOLOFT) 50 MG tablet Take 1 tablet each day Patient taking differently: Take 50 mg by mouth at bedtime.  09/03/20   Van Clines, MD  sucralfate (CARAFATE) 1 g tablet Take 1 tablet (1 g total) by mouth 4 (four) times daily -  with meals and at bedtime. 10/07/20   Gerhard Munch, MD    Allergies    Patient has no known allergies.  Review of Systems   Review of Systems  Neurological: Positive for seizures.  All other systems reviewed and are negative.   Physical Exam Updated Vital Signs BP (!) 133/91 (BP Location: Right Arm)   Pulse (!) 101   Temp 98.5 F (36.9 C) (Oral)   Resp 18   SpO2 100%   Physical Exam Vitals and nursing note reviewed.  Constitutional:      General: She is not in acute distress.    Appearance: She is well-developed and well-nourished.  HENT:     Head: Normocephalic and atraumatic.  Eyes:     Conjunctiva/sclera: Conjunctivae normal.  Cardiovascular:     Rate and Rhythm: Normal rate and regular rhythm.     Heart sounds: No murmur heard.   Pulmonary:     Effort: Pulmonary effort is normal. No respiratory distress.     Breath sounds: Normal breath sounds.  Abdominal:     Palpations: Abdomen is soft.     Tenderness: There is no abdominal tenderness.  Musculoskeletal:        General: No edema. Normal range of motion.     Cervical back: Neck supple.  Skin:    General: Skin is warm and dry.  Neurological:     Mental Status: She is alert and oriented to person, place, and time.  Psychiatric:        Mood and Affect: Mood and affect and mood normal.        Behavior: Behavior normal.     ED Results / Procedures / Treatments   Labs (all labs ordered are listed, but only abnormal results are displayed) Labs Reviewed  SARS CORONAVIRUS 2 (TAT 6-24 HRS)  CBC WITH DIFFERENTIAL/PLATELET  COMPREHENSIVE METABOLIC PANEL  LIPASE, BLOOD  I-STAT BETA  HCG BLOOD, ED ([facility], [facility], [facility] ONLY)    EKG None  Radiology No results found.  Procedures Procedures   Medications Ordered in ED Medications  sodium chloride 0.9 % bolus 1,000 mL (has no administration in time range)  morphine 4 MG/ML injection 4 mg (has no administration in time range)  ondansetron (ZOFRAN) injection 4 mg (has no administration in time range)    ED Course  I  have reviewed the triage vital signs and the nursing notes.  Pertinent labs & imaging results that were available during my care of the patient were reviewed by me and considered in my medical decision making (see chart for details).    MDM Rules/Calculators/A&P                          This patient complains of epigastric abdominal pain, this involves an extensive number of treatment options, and is a complaint that carries with it a high risk of complications and morbidity.    Differential Dx GERD, gastro, cyclical vomiting, pancreatitis  Pertinent Labs I ordered, reviewed, and interpreted labs, which included CBC notable for mildly elevated leukocytosis at 14.3, electrolytes notable for chloride of 95, anion gap mildly elevated 18.  I-STAT hCG is 13.4.  Patient states that she cannot be pregnant.  I think this is likely a false positive.  Advised to have patient recheck this in a week with a urine pregnancy test at home.  Imaging Interpretation I ordered imaging studies which included chest x-ray, which showed no obvious abnormality.  Medications I ordered medication GI cocktail, morphine, and Zofran for pain and nausea.    Sources Previous records obtained and reviewed and shows prior visits for similar.   Critical Interventions  none  Reassessments After the interventions stated above, I reevaluated the patient and found feeling improved.  No longer vomiting. Symptoms seem to mostly be related to retching from vomiting.  And I suspect that this is brought on by acid reflux.  She has had several  visits for the same in the past.  Her abdomen is benign, I doubt surgical abdomen.  There has been no seizure-like activity while in the ED.  Loading dose of Keppra was given.  Consultants None  Plan DC with antiemetic and omeprazole.    Final Clinical Impression(s) / ED Diagnoses Final diagnoses:  Non-intractable vomiting with nausea, unspecified vomiting type  Epigastric pain    Rx / DC Orders ED Discharge Orders         Ordered    sucralfate (CARAFATE) 1 g tablet  3 times daily with meals & bedtime       Note to Pharmacy: Take for one week   12/31/20 0611    omeprazole (PRILOSEC) 20 MG capsule  Daily        12/31/20 0611    ondansetron (ZOFRAN ODT) 4 MG disintegrating tablet  Every 8 hours PRN        12/31/20 0611           Roxy Horseman, PA-C 12/31/20 0612    Mesner, Barbara Cower, MD 12/31/20 [phone]

## 2020-12-31 NOTE — Discharge Instructions (Signed)
You need to take a home pregnancy test in 1 week.  Your pregnancy test here was positive, but minimally positive.  I think that it is most likely a false negative blood test.  Please follow-up with your regular doctor.  Return for new or worsening symptoms.  Continue taking your regular medications.

## 2020-12-31 NOTE — ED Triage Notes (Addendum)
Patient arrives complaining of seizures, N/V, and anterior chest wall pain. Per record review, these are recurrent events. Patient noted to have ambulatory EEG that was negative for epileptic activity, diagnosed with "Probably Non-epileptic Seizure Events." Patient is alert and oriented at this time, given 4mg  zofran by EMS. EMS reports no convulsive activity, although was reported that patient had a "20 minute seizure" per family with no post-ictal phase.

## 2021-01-06 ENCOUNTER — Other Ambulatory Visit: Payer: Self-pay

## 2021-01-06 ENCOUNTER — Ambulatory Visit
Admission: EM | Admit: 2021-01-06 | Discharge: 2021-01-06 | Disposition: A | Discharge status: Home / Self Care | Payer: Medicaid Other | Attending: Emergency Medicine | Admitting: Emergency Medicine

## 2021-01-06 ENCOUNTER — Encounter: Payer: Self-pay | Admitting: *Deleted

## 2021-01-06 DIAGNOSIS — N912 Amenorrhea, unspecified: Secondary | ICD-10-CM | POA: Insufficient documentation

## 2021-01-06 DIAGNOSIS — R112 Nausea with vomiting, unspecified: Secondary | ICD-10-CM | POA: Insufficient documentation

## 2021-01-06 DIAGNOSIS — R1084 Generalized abdominal pain: Secondary | ICD-10-CM | POA: Diagnosis present

## 2021-01-06 LAB — POCT URINALYSIS DIP (MANUAL ENTRY)
Bilirubin, UA: NEGATIVE
Blood, UA: NEGATIVE
Glucose, UA: NEGATIVE mg/dL
Ketones, POC UA: NEGATIVE mg/dL
Leukocytes, UA: NEGATIVE
Nitrite, UA: NEGATIVE
Protein Ur, POC: NEGATIVE mg/dL
Spec Grav, UA: 1.025 (ref 1.010–1.025)
Urobilinogen, UA: 1 E.U./dL
pH, UA: 7.5 (ref 5.0–8.0)

## 2021-01-06 LAB — POCT URINE PREGNANCY: Preg Test, Ur: NEGATIVE

## 2021-01-06 MED ORDER — NAPROXEN 500 MG PO TABS: 500.0000 mg | ORAL_TABLET | Freq: Two times a day (BID) | ORAL | 0 refills | Status: DC

## 2021-01-06 MED ORDER — PROMETHAZINE HCL 25 MG PO TABS: 25.0000 mg | ORAL_TABLET | Freq: Four times a day (QID) | ORAL | 0 refills | Status: DC | PRN

## 2021-01-06 NOTE — Discharge Instructions (Signed)
Pregnancy test negative Urine normal Vaginal swab pending to screen for STDs Blood work pending-I will call if abnormal Please try Phenergan as alternative for nausea Naprosyn twice daily as needed for any pelvic pain/lower abdominal discomfort Drink plenty of fluids  Referral placed to gastroenterology for follow-up I recommend also following up with OB/GYN if continuing to have irregular cycles and further evaluation of symptoms

## 2021-01-06 NOTE — ED Provider Notes (Signed)
[name]    CSN: [id number] Arrival date & time: 01/06/21  1059      History   Chief Complaint Chief Complaint  Patient presents with  . Amenorrhea  . Nausea  . Abdominal Pain    HPI Melinda Calderon is a 20 y.o. female history of siezures presenting today for evaluation of abdominal pain and nausea.  Patient reports that she has had intermittent nausea and generalized abdominal pains over the past month.  Was seen in the emergency room on 12/31/2020 and had slightly elevated hCG, was told likely a false positive recommend recheck in 1 week.  She has been using omeprazole, Carafate and Zofran without relief.LMP around 12/25; typically regular, no birth control. Reports nausea, occasional vomiting, back and side pain.   HPI  Past Medical History:  Diagnosis Date  . Anxiety   . GERD (gastroesophageal reflux disease)   . Seizures [facility])     Patient Active Problem List   Diagnosis Date Noted  . Arthralgia 08/19/2019  . Low back pain 08/19/2019  . 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 Panic disorder 10/23/2015  . Difficulty controlling anger 09/13/2015  . Dysphonia 09/12/2015  . Episodic tension-type headache, not intractable 02/15/2015  . Syncope and collapse 02/15/2015  . Migraine without aura 02/15/2015  . Lack of adequate sleep 02/15/2015  . 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 Encounter for long-term current use of medication 02/22/2014  . Seizure disorder (HCC) 12/27/2011  . Seizure (HCC) 12/27/2011  . Headache(784.0) 12/27/2011    History reviewed. No pertinent surgical history.  OB History   No obstetric history on file.      Home Medications    Prior to Admission medications   Medication Sig Start Date End Date Taking? Authorizing Provider  cetirizine (ZYRTEC) 10 MG tablet Take 10 mg by mouth daily as needed for allergies.  01/26/18  Yes [provider]   FYCOMPA 4 MG TABS Take 1 tablet at bedtime Patient taking differently: Take 4 mg by mouth at bedtime. 09/03/20  Yes Van Clines, MD  lamoTRIgine (LAMICTAL) 100 MG tablet Take 2 tablets by mouth twice per day Patient taking differently: Take 100 mg by mouth 2 (two) times daily. 09/03/20  Yes Van Clines, MD  levETIRAcetam (KEPPRA) 500 MG tablet Take 2 tablets twice per day Patient taking differently: Take 500 mg by mouth 2 (two) times daily. 09/03/20  Yes Van Clines, MD  naproxen (NAPROSYN) 500 MG tablet Take 1 tablet (500 mg total) by mouth 2 (two) times daily. 01/06/21  Yes Twylla Arceneaux C, PA-C  omeprazole (PRILOSEC) 20 MG capsule Take 1 capsule (20 mg total) by mouth daily. 12/31/20  Yes Roxy Horseman, PA-C  ondansetron (ZOFRAN ODT) 4 MG disintegrating tablet Take 1 tablet (4 mg total) by mouth every 8 (eight) hours as needed for nausea or vomiting. 12/31/20  Yes Roxy Horseman, PA-C  promethazine (PHENERGAN) 25 MG tablet Take 1 tablet (25 mg total) by mouth every 6 (six) hours as needed for nausea or vomiting. 01/06/21  Yes Kamiyah Kindel C, PA-C  sertraline (ZOLOFT) 50 MG tablet Take 1 tablet each day Patient taking differently: Take 50 mg by mouth at bedtime. 09/03/20  Yes Van Clines, MD  sucralfate (CARAFATE) 1 g tablet Take 1 tablet (1 g total) by mouth 4 (four) times daily -  with meals and at bedtime. 12/31/20  Yes Roxy Horseman, PA-C  ibuprofen (ADVIL,MOTRIN) 200 MG  tablet Take 400 mg by mouth daily as needed for moderate pain.     [provider]  dicyclomine (BENTYL) 20 MG tablet Take 1 tablet (20 mg total) by mouth 2 (two) times daily. 06/28/20 12/31/20  Joy, Shawn C, PA-C  famotidine (PEPCID) 20 MG tablet Take 1 tablet (20 mg total) by mouth 2 (two) times daily. 10/07/20 12/31/20  Gerhard Munch, MD    Family History Family History  Problem Relation Age of Onset  . Hypertension Mother   . Cancer Maternal Grandmother        Died at 13  . Cancer  Paternal Grandmother        Died at 87  . Epilepsy Maternal Grandfather   . Cirrhosis Maternal Grandfather        Died at 59  . Seizures Paternal Grandfather        Died at 73  . Epilepsy Paternal Aunt     Social History Social History   Tobacco Use  . Smoking status: Never Smoker  . Smokeless tobacco: Never Used  . Tobacco comment: family smokes outside  Vaping Use  . Vaping Use: Never used  Substance Use Topics  . Alcohol use: No  . Drug use: No     Allergies   Patient has no known allergies.   Review of Systems Review of Systems  Constitutional: Negative for fever.  Respiratory: Negative for shortness of breath.   Cardiovascular: Negative for chest pain.  Gastrointestinal: Positive for abdominal pain and nausea. Negative for diarrhea and vomiting.  Genitourinary: Negative for dysuria, flank pain, genital sores, hematuria, menstrual problem, vaginal bleeding, vaginal discharge and vaginal pain.  Musculoskeletal: Negative for back pain.  Skin: Negative for rash.  Neurological: Negative for dizziness, light-headedness and headaches.     Physical Exam Triage Vital Signs ED Triage Vitals  Enc Vitals Group     BP 01/06/21 1127 138/87     Pulse Rate 01/06/21 1127 (!) 102     Resp 01/06/21 1127 16     Temp 01/06/21 1127 98.7 F (37.1 C)     Temp Source 01/06/21 1127 Temporal     SpO2 01/06/21 1127 99 %     Weight --      Height --      Head Circumference --      Peak Flow --      Pain Score 01/06/21 1128 7     Pain Loc --      Pain Edu? --      Excl. in GC? --    No data found.  Updated Vital Signs BP 138/87   Pulse (!) 102   Temp 98.7 F (37.1 C) (Temporal)   Resp 16   LMP 11/24/2020 (Approximate)   SpO2 99%   Visual Acuity Right Eye Distance:   Left Eye Distance:   Bilateral Distance:    Right Eye Near:   Left Eye Near:    Bilateral Near:     Physical Exam Vitals and nursing note reviewed.  Constitutional:      Appearance: She is  well-developed and well-nourished.     Comments: No acute distress  HENT:     Head: Normocephalic and atraumatic.     Nose: Nose normal.  Eyes:     Conjunctiva/sclera: Conjunctivae normal.  Cardiovascular:     Rate and Rhythm: Normal rate.  Pulmonary:     Effort: Pulmonary effort is normal. No respiratory distress.  Abdominal:     General: There is no  distension.  Musculoskeletal:        General: Normal range of motion.     Cervical back: Neck supple.  Skin:    General: Skin is warm and dry.  Neurological:     Mental Status: She is alert and oriented to person, place, and time.  Psychiatric:        Mood and Affect: Mood and affect normal.      UC Treatments / Results  Labs (all labs ordered are listed, but only abnormal results are displayed) Labs Reviewed  CBC  COMPREHENSIVE METABOLIC PANEL  POCT URINALYSIS DIP (MANUAL ENTRY)  POCT URINE PREGNANCY  CERVICOVAGINAL ANCILLARY ONLY    EKG   Radiology No results found.  Procedures Procedures (including critical care time)  Medications Ordered in UC Medications - No data to display  Initial Impression / Assessment and Plan / UC Course  I have reviewed the triage vital signs and the nursing notes.  Pertinent labs & imaging results that were available during my care of the patient were reviewed by me and considered in my medical decision making (see chart for details).     Pregnancy test negative, UA unremarkable, vaginal swab pending to screen for any STDs/vaginal infections contributing to abdominal discomfort.  Repeating blood work as she did have some mild abnormalities in emergency room 1 week ago, will follow up on these to ensure improvement.  Initial hCG point-of-care was elevated, deemed to be false positive, follow-up hCG was negative after patient was discharged from ED visit.  Patient has had multiple visits with treatment for GERD/gastritis without improvement, will refer to gastroenterology.  Providing  Phenergan as alternative for nausea to Zofran.  Provided Naprosyn as trial to help with abdominal discomfort.  Push fluids.  Also recommended following up with OB/GYN for further evaluation given her recent irregular cycles/amenorrhea and abdominal pain.  Patient to emergency room if pain worsening.  Discussed strict return precautions. Patient verbalized understanding and is agreeable with plan.  Final Clinical Impressions(s) / UC Diagnoses   Final diagnoses:  Generalized abdominal pain  Amenorrhea  Non-intractable vomiting with nausea, unspecified vomiting type     Discharge Instructions     Pregnancy test negative Urine normal Vaginal swab pending to screen for STDs Blood work pending-I will call if abnormal Please try Phenergan as alternative for nausea Naprosyn twice daily as needed for any pelvic pain/lower abdominal discomfort Drink plenty of fluids  Referral placed to gastroenterology for follow-up I recommend also following up with OB/GYN if continuing to have irregular cycles and further evaluation of symptoms    ED Prescriptions    Medication Sig Dispense Auth. Provider   promethazine (PHENERGAN) 25 MG tablet Take 1 tablet (25 mg total) by mouth every 6 (six) hours as needed for nausea or vomiting. 30 tablet Kelcy Laible C, PA-C   naproxen (NAPROSYN) 500 MG tablet Take 1 tablet (500 mg total) by mouth 2 (two) times daily. 30 tablet Stasha Naraine, [place] C, PA-C     PDMP not reviewed this encounter.   Lew Dawes, [place] 01/06/21 1303

## 2021-01-06 NOTE — ED Triage Notes (Signed)
Was seen in ED 12/31/20  For abd pain and nausea.  States was told had slightly elevated HCG - was told likely a false positive and to recheck with home test in approx 1 wk.  C/O intermittent nausea and generalized abd pains x approx 1 month.  Has been taking omeprazole, sucralfate, and ondansetron without significant relief.

## 2021-01-07 ENCOUNTER — Telehealth (HOSPITAL_COMMUNITY): Payer: Self-pay | Admitting: Emergency Medicine

## 2021-01-07 LAB — COMPREHENSIVE METABOLIC PANEL
ALT: 13 IU/L (ref 0–32)
AST: 14 IU/L (ref 0–40)
Albumin/Globulin Ratio: 2.5 — ABNORMAL HIGH (ref 1.2–2.2)
Albumin: 4.8 g/dL (ref 3.9–5.0)
Alkaline Phosphatase: 67 IU/L (ref 44–121)
BUN/Creatinine Ratio: 18 (ref 9–23)
BUN: 13 mg/dL (ref 6–20)
Bilirubin Total: <0.2 mg/dL (ref 0.0–1.2)
CO2: 22 mmol/L (ref 20–29)
Calcium: 9.7 mg/dL (ref 8.7–10.2)
Chloride: 101 mmol/L (ref 96–106)
Creatinine, Ser: 0.72 mg/dL (ref 0.57–1.00)
GFR calc Af Amer: 137 mL/min/{1.73_m2} (ref >59)
GFR calc non Af Amer: 119 mL/min/{1.73_m2} (ref >59)
Globulin, Total: 1.9 g/dL (ref 1.5–4.5)
Glucose: 84 mg/dL (ref 65–99)
Potassium: 4.2 mmol/L (ref 3.5–5.2)
Sodium: 139 mmol/L (ref 134–144)
Total Protein: 6.7 g/dL (ref 6.0–8.5)

## 2021-01-07 LAB — CERVICOVAGINAL ANCILLARY ONLY
Bacterial Vaginitis (gardnerella): NEGATIVE
Candida Glabrata: NEGATIVE
Candida Vaginitis: POSITIVE — AB
Chlamydia: NEGATIVE
Comment: NEGATIVE
Comment: NEGATIVE
Comment: NEGATIVE
Comment: NEGATIVE
Comment: NEGATIVE
Comment: NORMAL
Neisseria Gonorrhea: NEGATIVE
Trichomonas: NEGATIVE

## 2021-01-07 LAB — CBC
Hematocrit: 39.9 % (ref 34.0–46.6)
Hemoglobin: 12.6 g/dL (ref 11.1–15.9)
MCH: 30.8 pg (ref 26.6–33.0)
MCHC: 31.6 g/dL (ref 31.5–35.7)
MCV: 98 fL — ABNORMAL HIGH (ref 79–97)
Platelets: 306 10*3/uL (ref 150–450)
RBC: 4.09 x10E6/uL (ref 3.77–5.28)
RDW: 12.6 % (ref 11.7–15.4)
WBC: 11.9 10*3/uL — ABNORMAL HIGH (ref 3.4–10.8)

## 2021-01-07 MED ORDER — FLUCONAZOLE 150 MG PO TABS: 150.0000 mg | ORAL_TABLET | Freq: Once | ORAL | 0 refills | Status: AC

## 2021-04-04 ENCOUNTER — Encounter (INDEPENDENT_AMBULATORY_CARE_PROVIDER_SITE_OTHER): Payer: Self-pay

## 2021-04-09 ENCOUNTER — Other Ambulatory Visit: Payer: Self-pay

## 2021-04-09 ENCOUNTER — Ambulatory Visit
Admission: EM | Admit: 2021-04-09 | Discharge: 2021-04-09 | Disposition: A | Discharge status: Home / Self Care | Payer: Medicaid Other | Attending: Emergency Medicine | Admitting: Emergency Medicine

## 2021-04-09 DIAGNOSIS — K047 Periapical abscess without sinus: Secondary | ICD-10-CM

## 2021-04-09 MED ORDER — CHLORHEXIDINE GLUCONATE 0.12 % MT SOLN: 15.0000 mL | Freq: Two times a day (BID) | OROMUCOSAL | 0 refills | Status: DC

## 2021-04-09 MED ORDER — IBUPROFEN 800 MG PO TABS: 800.0000 mg | ORAL_TABLET | Freq: Three times a day (TID) | ORAL | 0 refills | Status: DC

## 2021-04-09 MED ORDER — AMOXICILLIN 500 MG PO CAPS: 500.0000 mg | ORAL_CAPSULE | Freq: Three times a day (TID) | ORAL | 0 refills | Status: AC

## 2021-04-09 NOTE — ED Provider Notes (Signed)
[name]    CSN: [id number] Arrival date & time: 04/09/21  1908      History   Chief Complaint Chief Complaint  Patient presents with  . Dental Pain    HPI Melinda Calderon is a 20 y.o. female history of GERD, presenting today for evaluation of dental pain.  Reports over the past 2 days she has had worsening pain to her right lower jaw.  Reports that she has metal in these teeth and the last 2 molars have become more painful and swollen of recently.  Denies fevers.  Denies difficulty swallowing.  Does have plans to follow-up with dentistry on June 9 in approximately 1 month.  HPI  Past Medical History:  Diagnosis Date  . Anxiety   . GERD (gastroesophageal reflux disease)   . Seizures [facility])     Patient Active Problem List   Diagnosis Date Noted  . Arthralgia 08/19/2019  . Low back pain 08/19/2019  . 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 Panic disorder 10/23/2015  . Difficulty controlling anger 09/13/2015  . Dysphonia 09/12/2015  . Episodic tension-type headache, not intractable 02/15/2015  . Syncope and collapse 02/15/2015  . Migraine without aura 02/15/2015  . Lack of adequate sleep 02/15/2015  . 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 Encounter for long-term current use of medication 02/22/2014  . Seizure disorder (HCC) 12/27/2011  . Seizure (HCC) 12/27/2011  . Headache(784.0) 12/27/2011    History reviewed. No pertinent surgical history.  OB History   No obstetric history on file.      Home Medications    Prior to Admission medications   Medication Sig Start Date End Date Taking? Authorizing Provider  amoxicillin (AMOXIL) 500 MG capsule Take 1 capsule (500 mg total) by mouth 3 (three) times daily for 7 days. 04/09/21 04/16/21 Yes Reef Achterberg C, PA-C  chlorhexidine (PERIDEX) 0.12 % solution Use as directed 15 mLs in the mouth or throat 2 (two) times daily. Swish and spit  04/09/21  Yes Larine Fielding C, PA-C  ibuprofen (ADVIL) 800 MG tablet Take 1 tablet (800 mg total) by mouth 3 (three) times daily. 04/09/21  Yes Shariyah Eland C, PA-C  cetirizine (ZYRTEC) 10 MG tablet Take 10 mg by mouth daily as needed for allergies.  01/26/18   [provider]  FYCOMPA 4 MG TABS Take 1 tablet at bedtime Patient taking differently: Take 4 mg by mouth at bedtime. 09/03/20   Van Clines, MD  lamoTRIgine (LAMICTAL) 100 MG tablet Take 2 tablets by mouth twice per day Patient taking differently: Take 100 mg by mouth 2 (two) times daily. 09/03/20   Van Clines, MD  levETIRAcetam (KEPPRA) 500 MG tablet Take 2 tablets twice per day Patient taking differently: Take 500 mg by mouth 2 (two) times daily. 09/03/20   Van Clines, MD  omeprazole (PRILOSEC) 20 MG capsule Take 1 capsule (20 mg total) by mouth daily. 12/31/20   Roxy Horseman, PA-C  ondansetron (ZOFRAN ODT) 4 MG disintegrating tablet Take 1 tablet (4 mg total) by mouth every 8 (eight) hours as needed for nausea or vomiting. 12/31/20   Roxy Horseman, PA-C  promethazine (PHENERGAN) 25 MG tablet Take 1 tablet (25 mg total) by mouth every 6 (six) hours as needed for nausea or vomiting. 01/06/21   Britini Garcilazo C, PA-C  sertraline (ZOLOFT) 50 MG tablet Take 1 tablet each day Patient taking differently: Take 50 mg by mouth at bedtime. 09/03/20  Van Clines, MD  sucralfate (CARAFATE) 1 g tablet Take 1 tablet (1 g total) by mouth 4 (four) times daily -  with meals and at bedtime. 12/31/20   Roxy Horseman, PA-C  dicyclomine (BENTYL) 20 MG tablet Take 1 tablet (20 mg total) by mouth 2 (two) times daily. 06/28/20 12/31/20  Joy, Shawn C, PA-C  famotidine (PEPCID) 20 MG tablet Take 1 tablet (20 mg total) by mouth 2 (two) times daily. 10/07/20 12/31/20  Gerhard Munch, MD    Family History Family History  Problem Relation Age of Onset  . Hypertension Mother   . Cancer Maternal Grandmother        Died at 54  .  Cancer Paternal Grandmother        Died at 26  . Epilepsy Maternal Grandfather   . Cirrhosis Maternal Grandfather        Died at 54  . Seizures Paternal Grandfather        Died at 19  . Epilepsy Paternal Aunt     Social History Social History   Tobacco Use  . Smoking status: Never Smoker  . Smokeless tobacco: Never Used  . Tobacco comment: family smokes outside  Vaping Use  . Vaping Use: Never used  Substance Use Topics  . Alcohol use: No  . Drug use: No     Allergies   Patient has no known allergies.   Review of Systems Review of Systems  Constitutional: Negative for activity change, appetite change, chills, fatigue and fever.  HENT: Positive for dental problem. Negative for congestion, ear pain, rhinorrhea, sinus pressure, sore throat and trouble swallowing.   Eyes: Negative for discharge and redness.  Respiratory: Negative for cough, chest tightness and shortness of breath.   Cardiovascular: Negative for chest pain.  Gastrointestinal: Negative for abdominal pain, diarrhea, nausea and vomiting.  Musculoskeletal: Negative for myalgias.  Skin: Negative for rash.  Neurological: Negative for dizziness, light-headedness and headaches.     Physical Exam Triage Vital Signs ED Triage Vitals  Enc Vitals Group     BP      Pulse      Resp      Temp      Temp src      SpO2      Weight      Height      Head Circumference      Peak Flow      Pain Score      Pain Loc      Pain Edu?      Excl. in GC?    No data found.  Updated Vital Signs BP (!) 145/87 (BP Location: Left Arm)   Pulse (!) 122   Temp 99.1 F (37.3 C) (Oral)   Resp 18   LMP 03/30/2021   SpO2 96%   Visual Acuity Right Eye Distance:   Left Eye Distance:   Bilateral Distance:    Right Eye Near:   Left Eye Near:    Bilateral Near:     Physical Exam Vitals and nursing note reviewed.  Constitutional:      Appearance: She is well-developed.     Comments: No acute distress  HENT:      Head: Normocephalic and atraumatic.     Comments: No obvious facial or neck swelling    Ears:     Comments: Right lower jaw posterior molar appears fractured with surrounding gingival swelling and erythema, tender to palpation, no soft palate swelling, posterior pharynx patent, uvula midline  Nose: Nose normal.  Eyes:     Conjunctiva/sclera: Conjunctivae normal.  Cardiovascular:     Rate and Rhythm: Normal rate.  Pulmonary:     Effort: Pulmonary effort is normal. No respiratory distress.  Abdominal:     General: There is no distension.  Musculoskeletal:        General: Normal range of motion.     Cervical back: Neck supple.  Skin:    General: Skin is warm and dry.  Neurological:     Mental Status: She is alert and oriented to person, place, and time.      UC Treatments / Results  Labs (all labs ordered are listed, but only abnormal results are displayed) Labs Reviewed - No data to display  EKG   Radiology No results found.  Procedures Procedures (including critical care time)  Medications Ordered in UC Medications - No data to display  Initial Impression / Assessment and Plan / UC Course  I have reviewed the triage vital signs and the nursing notes.  Pertinent labs & imaging results that were available during my care of the patient were reviewed by me and considered in my medical decision making (see chart for details).     Treating for dental infection with amoxicillin, Peridex, anti-inflammatories for pain, follow-up with dentistry as planned.  No signs of deep space infection at this time.  Continue to monitor.  Discussed strict return precautions. Patient verbalized understanding and is agreeable with plan.  Final Clinical Impressions(s) / UC Diagnoses   Final diagnoses:  Dental infection     Discharge Instructions     Begin amoxicillin 3 times daily x1 week Chlorhexidine/Peridex mouth rinse twice daily Use anti-inflammatories for pain/swelling.  You may take up to 800 mg Ibuprofen every 8 hours with food. You may supplement Ibuprofen with Tylenol [phone] mg every 8 hours.  Follow-up with dentistry as planned If developing any increased pain swelling fevers neck stiffness difficulty swallowing please go to emergency room    ED Prescriptions    Medication Sig Dispense Auth. Provider   amoxicillin (AMOXIL) 500 MG capsule Take 1 capsule (500 mg total) by mouth 3 (three) times daily for 7 days. 21 capsule Sury Wentworth C, PA-C   chlorhexidine (PERIDEX) 0.12 % solution Use as directed 15 mLs in the mouth or throat 2 (two) times daily. Swish and spit 120 mL Mckinzey Entwistle C, PA-C   ibuprofen (ADVIL) 800 MG tablet Take 1 tablet (800 mg total) by mouth 3 (three) times daily. 21 tablet Myishia Kasik, Hag[place] C, PA-C     PDMP not reviewed this encounter.   Lew Dawes, [place] 04/09/21 2037

## 2021-04-09 NOTE — Discharge Instructions (Signed)
Begin amoxicillin 3 times daily x1 week Chlorhexidine/Peridex mouth rinse twice daily Use anti-inflammatories for pain/swelling. You may take up to 800 mg Ibuprofen every 8 hours with food. You may supplement Ibuprofen with Tylenol [phone] mg every 8 hours.  Follow-up with dentistry as planned If developing any increased pain swelling fevers neck stiffness difficulty swallowing please go to emergency room

## 2021-04-09 NOTE — ED Triage Notes (Signed)
Pt c/o toothache to rt lower teeth x2 days. States dentist appt 6/09.

## 2021-05-25 ENCOUNTER — Emergency Department (HOSPITAL_COMMUNITY)
Admission: EM | Admit: 2021-05-25 | Discharge: 2021-05-25 | Disposition: A | Discharge status: Home / Self Care | Payer: Medicaid Other | Attending: Emergency Medicine | Admitting: Emergency Medicine

## 2021-05-25 ENCOUNTER — Emergency Department (HOSPITAL_COMMUNITY): Payer: Medicaid Other

## 2021-05-25 ENCOUNTER — Encounter (HOSPITAL_COMMUNITY): Payer: Self-pay

## 2021-05-25 DIAGNOSIS — R569 Unspecified convulsions: Secondary | ICD-10-CM | POA: Diagnosis not present

## 2021-05-25 DIAGNOSIS — R109 Unspecified abdominal pain: Secondary | ICD-10-CM | POA: Insufficient documentation

## 2021-05-25 DIAGNOSIS — Z79899 Other long term (current) drug therapy: Secondary | ICD-10-CM | POA: Insufficient documentation

## 2021-05-25 DIAGNOSIS — R112 Nausea with vomiting, unspecified: Secondary | ICD-10-CM | POA: Diagnosis not present

## 2021-05-25 DIAGNOSIS — K029 Dental caries, unspecified: Secondary | ICD-10-CM | POA: Insufficient documentation

## 2021-05-25 LAB — RAPID URINE DRUG SCREEN, HOSP PERFORMED
Amphetamines: NOT DETECTED
Barbiturates: NOT DETECTED
Benzodiazepines: NOT DETECTED
Cocaine: NOT DETECTED
Opiates: NOT DETECTED
Tetrahydrocannabinol: POSITIVE — AB

## 2021-05-25 LAB — COMPREHENSIVE METABOLIC PANEL
ALT: 15 U/L (ref 0–44)
AST: 21 U/L (ref 15–41)
Albumin: 5.2 g/dL — ABNORMAL HIGH (ref 3.5–5.0)
Alkaline Phosphatase: 55 U/L (ref 38–126)
Anion gap: 15 (ref 5–15)
BUN: 9 mg/dL (ref 6–20)
CO2: 16 mmol/L — ABNORMAL LOW (ref 22–32)
Calcium: 9.4 mg/dL (ref 8.9–10.3)
Chloride: 109 mmol/L (ref 98–111)
Creatinine, Ser: 0.57 mg/dL (ref 0.44–1.00)
GFR, Estimated: >60 mL/min (ref >60)
Glucose, Bld: 173 mg/dL — ABNORMAL HIGH (ref 70–99)
Potassium: 3 mmol/L — ABNORMAL LOW (ref 3.5–5.1)
Sodium: 140 mmol/L (ref 135–145)
Total Bilirubin: 0.5 mg/dL (ref 0.3–1.2)
Total Protein: 7.8 g/dL (ref 6.5–8.1)

## 2021-05-25 LAB — CBC
HCT: 41.5 % (ref 36.0–46.0)
Hemoglobin: 14.3 g/dL (ref 12.0–15.0)
MCH: 32.1 pg (ref 26.0–34.0)
MCHC: 34.5 g/dL (ref 30.0–36.0)
MCV: 93 fL (ref 80.0–100.0)
Platelets: 297 10*3/uL (ref 150–400)
RBC: 4.46 MIL/uL (ref 3.87–5.11)
RDW: 13 % (ref 11.5–15.5)
WBC: 18.4 10*3/uL — ABNORMAL HIGH (ref 4.0–10.5)
nRBC: 0 % (ref 0.0–0.2)

## 2021-05-25 LAB — URINALYSIS, ROUTINE W REFLEX MICROSCOPIC
Bacteria, UA: NONE SEEN
Bilirubin Urine: NEGATIVE
Glucose, UA: NEGATIVE mg/dL
Ketones, ur: 5 mg/dL — AB
Leukocytes,Ua: NEGATIVE
Nitrite: NEGATIVE
Protein, ur: NEGATIVE mg/dL
Specific Gravity, Urine: 1.038 — ABNORMAL HIGH (ref 1.005–1.030)
pH: 7 (ref 5.0–8.0)

## 2021-05-25 LAB — I-STAT BETA HCG BLOOD, ED (MC, WL, AP ONLY): I-stat hCG, quantitative: <5 m[IU]/mL (ref <5)

## 2021-05-25 LAB — LIPASE, BLOOD: Lipase: 28 U/L (ref 11–51)

## 2021-05-25 LAB — ETHANOL: Alcohol, Ethyl (B): 60 mg/dL — ABNORMAL HIGH (ref <10)

## 2021-05-25 MED ORDER — ACETAMINOPHEN 325 MG PO TABS
650.0000 mg | ORAL_TABLET | Freq: Once | ORAL | Status: AC
  Administered 2021-05-25: 650 mg via ORAL
  Filled 2021-05-25: qty 2

## 2021-05-25 MED ORDER — SODIUM CHLORIDE 0.9 % IV BOLUS
1000.0000 mL | Freq: Once | INTRAVENOUS | Status: AC
  Administered 2021-05-25: 1000 mL via INTRAVENOUS

## 2021-05-25 MED ORDER — POTASSIUM CHLORIDE IN NACL 20-0.9 MEQ/L-% IV SOLN
Freq: Once | INTRAVENOUS | Status: AC
  Filled 2021-05-25: qty 1000

## 2021-05-25 MED ORDER — IOHEXOL 300 MG/ML  SOLN
100.0000 mL | Freq: Once | INTRAMUSCULAR | Status: AC | PRN
  Administered 2021-05-25: 100 mL via INTRAVENOUS

## 2021-05-25 MED ORDER — POTASSIUM CHLORIDE CRYS ER 20 MEQ PO TBCR
40.0000 meq | EXTENDED_RELEASE_TABLET | Freq: Once | ORAL | Status: AC
  Administered 2021-05-25: 40 meq via ORAL
  Filled 2021-05-25: qty 2

## 2021-05-25 MED ORDER — LORAZEPAM 2 MG/ML IJ SOLN
1.0000 mg | Freq: Once | INTRAMUSCULAR | Status: AC
  Administered 2021-05-25: 1 mg via INTRAVENOUS
  Filled 2021-05-25: qty 1

## 2021-05-25 MED ORDER — ONDANSETRON HCL 4 MG/2ML IJ SOLN
4.0000 mg | Freq: Once | INTRAMUSCULAR | Status: AC
  Administered 2021-05-25: 4 mg via INTRAVENOUS
  Filled 2021-05-25: qty 2

## 2021-05-25 MED ORDER — SODIUM CHLORIDE (PF) 0.9 % IJ SOLN
INTRAMUSCULAR | Status: AC
  Filled 2021-05-25: qty 50

## 2021-05-25 MED ORDER — POTASSIUM CHLORIDE ER 10 MEQ PO TBCR: 10.0000 meq | EXTENDED_RELEASE_TABLET | Freq: Two times a day (BID) | ORAL | 0 refills | Status: DC

## 2021-05-25 NOTE — ED Notes (Addendum)
Pt had 2 episodes of  Seizure-like activity (approximately 5 minutes apart) observed by staff. Pt eyes rolled back and her limbs became limp. Each episode lasted less than 1 minute.

## 2021-05-25 NOTE — ED Provider Notes (Signed)
[facility]-EMERGENCY DEPT Provider Note   CSN: [id number] Arrival date & time: 05/25/21  0542     History Chief Complaint  Patient presents with   Seizures    Melinda Calderon is a 20 y.o. 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  Patient states that she is being treated with antibiotics for a dental infection, she is unsure which antibiotics.  States that they will do the procedure after she has completed her course of antibiotics. States that last night she went out and was drinking grey goose.  Stated that after this she started having her seizures.  States that she had 2 seizures.  States that she has been compliant with her seizure medications.  Denies any other substance abuse.  Patient is currently stating that her whole body is hurting, her abdomen hurts and she feels very nauseous.  Patient is currently retching.  Denies any diarrhea, fevers, other symptoms at this time.  Denies any head trauma. AEDs include Lamotrigine 200mg  BID, Levetiracetam 500mg  BID, and Fycompa 4mg  qhs.  Per chart review patient had a normal awake and sleep EEG on 09/11/20 and a normal 72-hour EEG the month after.  At that time they recommended continuing the same doses of medications.   Per chart review patient sees Dr.Aquino, neurology.  They report a seizure every other day, sometimes having up to 3 seizures a day.  Per chart review patient also reports non-intractable nausea vomiting and migraines often. 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  . HPI     Past Medical History:  Diagnosis Date   Anxiety    GERD (gastroesophageal reflux disease)    Seizures (HCC)     Patient  Active Problem List   Diagnosis Date Noted   Arthralgia 08/19/2019   Low back pain 08/19/2019   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   Probable non-epileptic seizure events 07/03/2016   Panic disorder 10/23/2015   Difficulty controlling anger 09/13/2015   Dysphonia 09/12/2015   Episodic tension-type headache, not intractable 02/15/2015   Syncope and collapse 02/15/2015   Migraine without aura 02/15/2015   Lack of adequate sleep 02/15/2015   Generalized convulsive epilepsy (HCC) 02/22/2014   Encounter for long-term current use of medication 02/22/2014   Seizure disorder (HCC) 12/27/2011   Seizure (HCC) 12/27/2011   Headache(784.0) 12/27/2011    History reviewed. No pertinent surgical history.   OB History   No obstetric history on file.     Family History  Problem Relation Age of Onset   Hypertension Mother    Cancer Maternal Grandmother        Died at 53   Cancer Paternal Grandmother        Died at 54   Epilepsy Maternal Grandfather    Cirrhosis Maternal Grandfather        Died at 31   Seizures Paternal Grandfather        Died at 78   Epilepsy Paternal Aunt     Social History   Tobacco Use   Smoking status: Never   Smokeless tobacco: Never   Tobacco comments:    family smokes outside  Vaping Use   Vaping Use: Never used  Substance Use Topics   Alcohol use: Yes   Drug  use: No    Home Medications Prior to Admission medications   Medication Sig Start Date End Date Taking? Authorizing Provider  potassium chloride (KLOR-CON) 10 MEQ tablet Take 1 tablet (10 mEq total) by mouth 2 (two) times daily for 5 days. 05/25/21 05/30/21 Yes Myan Locatelli, PA-C  cetirizine (ZYRTEC) 10 MG tablet Take 10 mg by mouth daily as needed for allergies.  01/26/18   [provider]  chlorhexidine (PERIDEX) 0.12 % solution Use as directed 15 mLs in the mouth or throat 2 (two) times daily. Swish and spit 04/09/21   Wieters, Hallie C, PA-C  FYCOMPA 4 MG TABS Take 1 tablet at  bedtime Patient taking differently: Take 4 mg by mouth at bedtime. 09/03/20   Van Clines, MD  ibuprofen (ADVIL) 800 MG tablet Take 1 tablet (800 mg total) by mouth 3 (three) times daily. 04/09/21   Wieters, Hallie C, PA-C  lamoTRIgine (LAMICTAL) 100 MG tablet Take 2 tablets by mouth twice per day Patient taking differently: Take 100 mg by mouth 2 (two) times daily. 09/03/20   Van Clines, MD  levETIRAcetam (KEPPRA) 500 MG tablet Take 2 tablets twice per day Patient taking differently: Take 500 mg by mouth 2 (two) times daily. 09/03/20   Van Clines, MD  omeprazole (PRILOSEC) 20 MG capsule Take 1 capsule (20 mg total) by mouth daily. 12/31/20   Roxy Horseman, PA-C  ondansetron (ZOFRAN ODT) 4 MG disintegrating tablet Take 1 tablet (4 mg total) by mouth every 8 (eight) hours as needed for nausea or vomiting. 12/31/20   Roxy Horseman, PA-C  promethazine (PHENERGAN) 25 MG tablet Take 1 tablet (25 mg total) by mouth every 6 (six) hours as needed for nausea or vomiting. 01/06/21   Wieters, Hallie C, PA-C  sertraline (ZOLOFT) 50 MG tablet Take 1 tablet each day Patient taking differently: Take 50 mg by mouth at bedtime. 09/03/20   Van Clines, MD  sucralfate (CARAFATE) 1 g tablet Take 1 tablet (1 g total) by mouth 4 (four) times daily -  with meals and at bedtime. 12/31/20   Roxy Horseman, PA-C  dicyclomine (BENTYL) 20 MG tablet Take 1 tablet (20 mg total) by mouth 2 (two) times daily. 06/28/20 12/31/20  Joy, Shawn C, PA-C  famotidine (PEPCID) 20 MG tablet Take 1 tablet (20 mg total) by mouth 2 (two) times daily. 10/07/20 12/31/20  Gerhard Munch, MD    Allergies    Patient has no known allergies.  Review of Systems   Review of Systems  Constitutional:  Negative for chills, diaphoresis, fatigue and fever.  HENT:  Positive for dental problem. Negative for congestion, sore throat and trouble swallowing.   Eyes:  Negative for pain and visual disturbance.  Respiratory:  Negative for cough,  shortness of breath and wheezing.   Cardiovascular:  Negative for chest pain, palpitations and leg swelling.  Gastrointestinal:  Positive for abdominal pain, nausea and vomiting. Negative for abdominal distention and diarrhea.  Genitourinary:  Negative for difficulty urinating.  Musculoskeletal:  Negative for back pain, neck pain and neck stiffness.  Skin:  Negative for pallor.  Neurological:  Positive for seizures. Negative for dizziness, facial asymmetry, speech difficulty, weakness, light-headedness, numbness and headaches.  Psychiatric/Behavioral:  Negative for confusion.    Physical Exam Updated Vital Signs BP (!) 149/98   Pulse 95   Temp 98.2 F (36.8 C) (Oral)   Resp 15   SpO2 97%   Physical Exam Constitutional:      General: She  is in acute distress.     Appearance: Normal appearance. She is not ill-appearing, toxic-appearing or diaphoretic.  HENT:     Mouth/Throat:     Mouth: Mucous membranes are moist.     Pharynx: Oropharynx is clear.     Comments: Patient has multiple dental caries however no source of infection.  Palpated, no signs of abscess or tenderness palpation in any specific point.  No gingivitis.  Patient is not drooling, no trismus.  No facial swelling or facial tenderness.  EF Eyes:     General: No scleral icterus.    Extraocular Movements: Extraocular movements intact.     Pupils: Pupils are equal, round, and reactive to light.  Cardiovascular:     Rate and Rhythm: Normal rate and regular rhythm.     Pulses: Normal pulses.     Heart sounds: Normal heart sounds.  Pulmonary:     Effort: Pulmonary effort is normal. No respiratory distress.     Breath sounds: Normal breath sounds. No stridor. No wheezing, rhonchi or rales.  Chest:     Chest wall: No tenderness.  Abdominal:     General: Abdomen is flat. There is no distension.     Palpations: Abdomen is soft.     Tenderness: There is no abdominal tenderness. There is no guarding or rebound.     Comments:  Generalized   Musculoskeletal:        General: No swelling or tenderness. Normal range of motion.     Cervical back: Normal range of motion and neck supple. No rigidity.     Right lower leg: No edema.     Left lower leg: No edema.  Skin:    General: Skin is warm and dry.     Capillary Refill: Capillary refill takes less than 2 seconds.     Coloration: Skin is not pale.  Neurological:     General: No focal deficit present.     Mental Status: She is alert and oriented to person, place, and time.     Comments: Alert. Clear speech. No facial droop. CNIII-XII grossly intact. Bilateral upper and lower extremities' sensation grossly intact. 5/5 symmetric strength with grip strength and with plantar and dorsi flexion bilaterally.  Negative pronator drift. . Gait is steady and intact.  Patient had 1 " seizure "when I was in the room.  Patient's eyes rolled back and she started hyperventilating and her body went limp for a couple seconds, while this was happening she was speaking to me stating that she was having a seizure.  No tonic-clonic movement.     Psychiatric:        Mood and Affect: Mood normal.        Behavior: Behavior normal.    ED Results / Procedures / Treatments   Labs (all labs ordered are listed, but only abnormal results are displayed) Labs Reviewed  ETHANOL - Abnormal; Notable for the following components:      Result Value   Alcohol, Ethyl (B) 60 (*)    All other components within normal limits  CBC - Abnormal; Notable for the following components:   WBC 18.4 (*)    All other components within normal limits  COMPREHENSIVE METABOLIC PANEL - Abnormal; Notable for the following components:   Potassium 3.0 (*)    CO2 16 (*)    Glucose, Bld 173 (*)    Albumin 5.2 (*)    All other components within normal limits  RAPID URINE DRUG SCREEN, HOSP PERFORMED -  Abnormal; Notable for the following components:   Tetrahydrocannabinol POSITIVE (*)    All other components within  normal limits  URINALYSIS, ROUTINE W REFLEX MICROSCOPIC - Abnormal; Notable for the following components:   Color, Urine STRAW (*)    Specific Gravity, Urine 1.038 (*)    Hgb urine dipstick SMALL (*)    Ketones, ur 5 (*)    All other components within normal limits  LIPASE, BLOOD  I-STAT BETA HCG BLOOD, ED ([facility], [facility], [facility] ONLY)    EKG EKG Interpretation  Date/Time:  Saturday May 25 2021 06:39:36 EDT Ventricular Rate:  82 PR Interval:  206 QRS Duration: 70 QT Interval:  410 QTC Calculation: 479 R Axis:   83 Text Interpretation: Sinus rhythm Borderline prolonged PR interval Nonspecific T abnormalities, anterior leads Borderline prolonged QT interval Confirmed by Lorre Nick ([id number]) on 05/25/2021 8:29:16 AM  Radiology CT Abdomen Pelvis W Contrast  Result Date: 05/25/2021 CLINICAL DATA:  Nausea and vomiting after heavy drinking.  Seizures. EXAM: CT ABDOMEN AND PELVIS WITH CONTRAST TECHNIQUE: Multidetector CT imaging of the abdomen and pelvis was performed using the standard protocol following bolus administration of intravenous contrast. CONTRAST:  OMNIPAQUE IOHEXOL 300 MG/ML  SOLN COMPARISON:  06/28/2020 FINDINGS: Lower chest:  No contributory findings. Hepatobiliary: No focal liver abnormality.No evidence of biliary obstruction or stone. Pancreas: Unremarkable. Spleen: Unremarkable. Adrenals/Urinary Tract: Negative adrenals. No hydronephrosis or stone. Unremarkable bladder. Stomach/Bowel:  No obstruction. No appendicitis. Vascular/Lymphatic: No acute vascular abnormality. No mass or adenopathy. Reproductive:No pathologic findings. Other: No ascites or pneumoperitoneum. Small volume pelvic fluid, likely physiologic. Musculoskeletal: No acute abnormalities. Motion artifact. IMPRESSION: Negative motion degraded abdominal CT. Electronically Signed   By: Marnee Spring M.D.   On: 05/25/2021 09:55    Procedures Procedures   Medications Ordered in ED Medications  sodium chloride (PF)  0.9 % injection (has no administration in time range)  ondansetron (ZOFRAN) injection 4 mg (4 mg Intravenous Given 05/25/21 0621)  sodium chloride 0.9 % bolus 1,000 mL (0 mLs Intravenous Stopped 05/25/21 1232)  LORazepam (ATIVAN) injection 1 mg (1 mg Intravenous Given 05/25/21 0704)  0.9 % NaCl with KCl 20 mEq/ L  infusion ( Intravenous Stopped 05/25/21 1222)  iohexol (OMNIPAQUE) 300 MG/ML solution 100 mL (100 mLs Intravenous Contrast Given 05/25/21 0901)  LORazepam (ATIVAN) injection 1 mg (1 mg Intravenous Given 05/25/21 1053)  potassium chloride SA (KLOR-CON) CR tablet 40 mEq (40 mEq Oral Given 05/25/21 1150)  acetaminophen (TYLENOL) tablet 650 mg (650 mg Oral Given 05/25/21 1150)    ED Course  I have reviewed the triage vital signs and the nursing notes.  Pertinent labs & imaging results that were available during my care of the patient were reviewed by me and considered in my medical decision making (see chart for details).    MDM Rules/Calculators/A&P                          IRELYND ZUMSTEIN is a 20 y.o. 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    When I was in the room the patient was laying in the bed, with normal neuro exam.  As soon as mom got into the room she started hyperventilating stating that she was having a  seizure, retching in the bed.  When patient stated that she was having a seizure she rolled her eyes back, started hyperventilating and her body went limp for a few seconds, she was speaking to me at this time.  When I spoke to the nursing who documented that she was having a seizure she states that same thing happened.  Happened for couple seconds at a time and then patient went back  to retching and hyperventilating with eyes open.  Mom states that this is what happened last night. No urinary incontinence or tongue biting. No post ictal period.   Will check basic labs at this time and give Ativan for seizures?,  Hyperventilating, and intractable nausea.  Work-up today does show alcohol level of 60, CBC with white count of 18.4.  Do not think that white count is coming from odontogenic source since patient is not having any pain to palpation of teeth, no trismus, drooling or other signs of infection.  Think this could be multifactorial due to intractable nausea vomiting, seizure-like activity.   CMP did show electrolyte derangements including potassium of 3, CO2 of 16, most likely from hyperventilating.  Potassium has been repleted in the ER.  Work-up today with CT abdomen pelvis without any acute intra-abdominal pathology.  White count of 18 most likely coming from retching, seizure-like activity.  Upon reevaluation patient appears better, is ambulatory now and passed p.o. challenge.  NO longer retching.Asking for discharge at this time.  Has not had another episode, pt has been observed for 7 hours. We will have patient follow-up with her neurologist and her PCP.  Do not think that we need to change any antiepileptics at this time or load patient with any antiepileptics, patient has good follow-up with neurology, will see them this week and no true seizures were witnessed by me.  Patient states that she has about 3 of these a day and has been compliant with all of her seizure medications. Do not think it is apprroiate at this time to load pt with aniteplpeitics, especially since pt is already on 3 AEDS.  Mom states that she primarily brought her to the emergency department because of the nausea/vomiting and rethcing that occurred after the seizure. This has been controlled here in the ER.  Doubt need for further emergent work up at this time.  Patient will follow-up with PCP and  neurology.  Mom and pt are very agreeable with this plan and wants to be discharged.  I explained the diagnosis and have given explicit precautions to return to the ER including for any other new or worsening symptoms. The patient understands and accepts the medical plan as it's been dictated and I have answered their questions. Discharge instructions concerning home care and prescriptions have been given. The patient is STABLE and is discharged to home in good condition.  I discussed this case with my attending physician who cosigned this note including patient's presenting symptoms, physical exam, and planned diagnostics and interventions. Attending physician stated agreement with plan or made changes to plan which were implemented.    Final Clinical Impression(s) / ED Diagnoses Final diagnoses:  Seizure-like activity (HCC)  Intractable nausea and vomiting    Rx / DC Orders ED Discharge Orders          Ordered    potassium chloride (KLOR-CON) 10 MEQ tablet  2 times daily        05/25/21 1215             Nilay Mangrum,  Edison NasutiShalyn, PA-C 05/25/21 1544    Lorre NickAllen, Anthony, MD 05/26/21 [phone]

## 2021-05-25 NOTE — ED Triage Notes (Signed)
Pt BIB EMS from home. Mother reports pt ingested ETOH at 10pm. Seizures began at midnight. Last witnessed seizure around 5am. Approximately 5 seizures between midnight-0500. EMS gave 4mg  zofran

## 2021-05-25 NOTE — Discharge Instructions (Addendum)
  You were evaluated in the Emergency Department and after careful evaluation, we did not find any emergent condition requiring admission or further testing in the hospital.   Your exam/testing today was overall reassuring.  With your primary care doctor in regards to your nausea vomiting, you can take your home prescriptions of Zofran and Phenergan when you feel like you would need to vomit.  Make sure you stay hydrated.  I also want you to follow-up with your neurologist for your seizures.  Continue taking your seizure medication.  I also prescribed you a couple days worth of potassium since your potassium was slightly low today.  Please take this as prescribed. Please return to the Emer please follow-up with condition.  Thank you for allowing [place] to be a part of your care. Please speak to your pharmacist about any new medications prescribed today in regards to side effects or interactions with other medications.

## 2021-05-25 NOTE — ED Provider Notes (Signed)
Emergency Medicine Provider Triage Evaluation Note  [name] , a 20 y.o. female  was evaluated in triage.  Pt complains of drinking heavily last evening.  Reportedly began having seizures at midnight, 5 since then.  On arrival to ED began shaking and voiced that she was having seizures.  Continues moaning in treatment room.  Per chart review has had prior EEG without noted epileptiform changes.  Also has history of likely psychogenic nonepileptic seizures.  Review of Systems  Positive: EtOH, seizures  Physical Exam  BP (!) 147/96 (BP Location: Right Arm)   Pulse (!) 102   Temp 98.2 F (36.8 C) (Oral)   Resp (!) 21   SpO2 99%  Gen:   Awake, no distress, moaning Resp:  Normal effort  MSK:   Moves extremities without difficulty   Medical Decision Making  Medically screening exam initiated at 6:04 AM.  Appropriate orders placed.  Gilford Rile Appelt was informed that the remainder of the evaluation will be completed by another provider, this initial triage assessment does not replace that evaluation, and the importance of remaining in the ED until their evaluation is complete.  Patient reports continued seizures in ED but continues talking through these events. No post-ictal period observed.  Will order screening labs.    Garlon Hatchet, PA-C 05/25/21 [id number]    Marily Memos, MD 05/25/21 [phone]

## 2021-05-25 NOTE — ED Notes (Signed)
Pt brief changed. Personal hygiene performed.

## 2021-05-25 NOTE — ED Notes (Signed)
Patient ambulated with assistance to the bathroom with no difficulties.

## 2021-08-16 ENCOUNTER — Telehealth: Payer: Self-pay | Admitting: Neurology

## 2021-08-16 DIAGNOSIS — G40309 Generalized idiopathic epilepsy and epileptic syndromes, not intractable, without status epilepticus: Secondary | ICD-10-CM

## 2021-08-16 MED ORDER — LEVETIRACETAM 500 MG PO TABS: ORAL_TABLET | ORAL | 2 refills | Status: DC

## 2021-08-16 NOTE — Addendum Note (Signed)
Addended by: Dimas Chyle on: 08/16/2021 04:24 PM   Modules accepted: Orders

## 2021-08-16 NOTE — Telephone Encounter (Signed)
Pt c/o: seizure Missed medications?  No. Sleep deprived?  Yes.   Cant get any sleep, hasn't slept in a while Alcohol intake?  No. Back to their usual baseline self?  Yes.  .  Any any increase in stress? Yes, trying to work and she can't. Has a lot going on per mom  Current medications prescribed by Dr. Karel Jarvis:  lamotrigine 100mg  BID Levetiracetam 500mg  BID FYCOMPA 4 MG Daily    Doesn't like people, gets mad real fast mom got her psychiatrist at [facility]

## 2021-08-16 NOTE — Telephone Encounter (Signed)
Spoke with pt mother informed her that It is possible these are stress-related, we can increase Keppra to 2 tabs in AM, 3 tabs in PM. But would recommend we do inpatient video EEG to see where the seizures are coming from. They would like to increase the meds and do the inpatient EEG

## 2021-08-16 NOTE — Telephone Encounter (Signed)
It is possible these are stress-related, we can increase Keppra to 2 tabs in AM, 3 tabs in PM. But would recommend we do inpatient video EEG to see where the seizures are coming from. Ok to send in updated Rx if she agrees to change in dose, thanks

## 2021-08-16 NOTE — Telephone Encounter (Signed)
Pt mother called in, said Jalaiya has been having multiple episodes here lately, sometimes back to back. They called and made appt, first avail is in April. They are wanting to know if she needs to be upped on her medication. She doesnt think she should have to wait till April. She wants a call back to discuss

## 2021-10-01 ENCOUNTER — Ambulatory Visit
Admission: EM | Admit: 2021-10-01 | Discharge: 2021-10-01 | Disposition: A | Discharge status: Home / Self Care | Payer: Medicaid Other

## 2021-10-01 ENCOUNTER — Encounter: Payer: Self-pay | Admitting: Emergency Medicine

## 2021-10-01 ENCOUNTER — Other Ambulatory Visit: Payer: Self-pay

## 2021-10-01 DIAGNOSIS — R519 Headache, unspecified: Secondary | ICD-10-CM | POA: Diagnosis not present

## 2021-10-01 DIAGNOSIS — R1030 Lower abdominal pain, unspecified: Secondary | ICD-10-CM
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
## 2021-10-01 NOTE — Discharge Instructions (Signed)
Please go to the hospital as soon as you leave urgent care for further evaluation and management. 
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
## 2022-01-12 ENCOUNTER — Emergency Department (HOSPITAL_COMMUNITY): Payer: Medicaid Other

## 2022-01-12 ENCOUNTER — Encounter (HOSPITAL_COMMUNITY): Payer: Self-pay

## 2022-01-12 ENCOUNTER — Emergency Department (HOSPITAL_COMMUNITY)
Admission: EM | Admit: 2022-01-12 | Discharge: 2022-01-12 | Disposition: A | Discharge status: Home / Self Care | Payer: Medicaid Other | Attending: Emergency Medicine | Admitting: Emergency Medicine

## 2022-01-12 ENCOUNTER — Other Ambulatory Visit: Payer: Self-pay

## 2022-01-12 DIAGNOSIS — E860 Dehydration: Secondary | ICD-10-CM | POA: Insufficient documentation

## 2022-01-12 DIAGNOSIS — R112 Nausea with vomiting, unspecified: Secondary | ICD-10-CM | POA: Diagnosis present

## 2022-01-12 DIAGNOSIS — R000 Tachycardia, unspecified: Secondary | ICD-10-CM | POA: Diagnosis not present

## 2022-01-12 DIAGNOSIS — F12188 Cannabis abuse with other cannabis-induced disorder: Secondary | ICD-10-CM | POA: Insufficient documentation

## 2022-01-12 DIAGNOSIS — Z79899 Other long term (current) drug therapy: Secondary | ICD-10-CM | POA: Insufficient documentation

## 2022-01-12 DIAGNOSIS — R109 Unspecified abdominal pain: Secondary | ICD-10-CM | POA: Diagnosis not present

## 2022-01-12 LAB — CBC WITH DIFFERENTIAL/PLATELET
Abs Immature Granulocytes: 0.05 10*3/uL (ref 0.00–0.07)
Basophils Absolute: 0.1 10*3/uL (ref 0.0–0.1)
Basophils Relative: 0 %
Eosinophils Absolute: 0 10*3/uL (ref 0.0–0.5)
Eosinophils Relative: 0 %
HCT: 44.9 % (ref 36.0–46.0)
Hemoglobin: 15.2 g/dL — ABNORMAL HIGH (ref 12.0–15.0)
Immature Granulocytes: 0 %
Lymphocytes Relative: 11 %
Lymphs Abs: 1.5 10*3/uL (ref 0.7–4.0)
MCH: 31.1 pg (ref 26.0–34.0)
MCHC: 33.9 g/dL (ref 30.0–36.0)
MCV: 92 fL (ref 80.0–100.0)
Monocytes Absolute: 0.5 10*3/uL (ref 0.1–1.0)
Monocytes Relative: 4 %
Neutro Abs: 10.9 10*3/uL — ABNORMAL HIGH (ref 1.7–7.7)
Neutrophils Relative %: 85 %
Platelet Morphology: NORMAL
Platelets: 338 10*3/uL (ref 150–400)
RBC: 4.88 MIL/uL (ref 3.87–5.11)
RDW: 13 % (ref 11.5–15.5)
WBC: 13 10*3/uL — ABNORMAL HIGH (ref 4.0–10.5)
nRBC: 0 % (ref 0.0–0.2)

## 2022-01-12 LAB — I-STAT BETA HCG BLOOD, ED (MC, WL, AP ONLY): I-stat hCG, quantitative: <5 m[IU]/mL (ref <5)

## 2022-01-12 LAB — COMPREHENSIVE METABOLIC PANEL
ALT: 36 U/L (ref 0–44)
AST: 48 U/L — ABNORMAL HIGH (ref 15–41)
Albumin: 5.1 g/dL — ABNORMAL HIGH (ref 3.5–5.0)
Alkaline Phosphatase: 68 U/L (ref 38–126)
Anion gap: 15 (ref 5–15)
BUN: 11 mg/dL (ref 6–20)
CO2: 21 mmol/L — ABNORMAL LOW (ref 22–32)
Calcium: 9.9 mg/dL (ref 8.9–10.3)
Chloride: 101 mmol/L (ref 98–111)
Creatinine, Ser: 0.72 mg/dL (ref 0.44–1.00)
GFR, Estimated: >60 mL/min (ref >60)
Glucose, Bld: 142 mg/dL — ABNORMAL HIGH (ref 70–99)
Potassium: 3.4 mmol/L — ABNORMAL LOW (ref 3.5–5.1)
Sodium: 137 mmol/L (ref 135–145)
Total Bilirubin: 0.3 mg/dL (ref 0.3–1.2)
Total Protein: 8.4 g/dL — ABNORMAL HIGH (ref 6.5–8.1)

## 2022-01-12 LAB — URINALYSIS, ROUTINE W REFLEX MICROSCOPIC
Bacteria, UA: NONE SEEN
Bilirubin Urine: NEGATIVE
Glucose, UA: NEGATIVE mg/dL
Ketones, ur: NEGATIVE mg/dL
Leukocytes,Ua: NEGATIVE
Nitrite: NEGATIVE
Protein, ur: NEGATIVE mg/dL
Specific Gravity, Urine: 1.039 — ABNORMAL HIGH (ref 1.005–1.030)
pH: 7 (ref 5.0–8.0)

## 2022-01-12 LAB — ETHANOL: Alcohol, Ethyl (B): <10 mg/dL (ref <10)

## 2022-01-12 LAB — CBG MONITORING, ED: Glucose-Capillary: 157 mg/dL — ABNORMAL HIGH (ref 70–99)

## 2022-01-12 LAB — RAPID URINE DRUG SCREEN, HOSP PERFORMED
Amphetamines: NOT DETECTED
Barbiturates: NOT DETECTED
Benzodiazepines: NOT DETECTED
Cocaine: NOT DETECTED
Opiates: NOT DETECTED
Tetrahydrocannabinol: POSITIVE — AB

## 2022-01-12 LAB — MAGNESIUM: Magnesium: 1.8 mg/dL (ref 1.7–2.4)

## 2022-01-12 MED ORDER — SODIUM CHLORIDE 0.9 % IV SOLN: INTRAVENOUS | Status: DC

## 2022-01-12 MED ORDER — SODIUM CHLORIDE 0.9 % IV BOLUS
1000.0000 mL | Freq: Once | INTRAVENOUS | Status: AC
  Administered 2022-01-12: 1000 mL via INTRAVENOUS

## 2022-01-12 MED ORDER — IOHEXOL 300 MG/ML  SOLN
100.0000 mL | Freq: Once | INTRAMUSCULAR | Status: AC | PRN
  Administered 2022-01-12: 100 mL via INTRAVENOUS

## 2022-01-12 MED ORDER — SODIUM CHLORIDE 0.9 % IV SOLN
12.5000 mg | Freq: Once | INTRAVENOUS | Status: AC
  Administered 2022-01-12: 12.5 mg via INTRAVENOUS
  Filled 2022-01-12: qty 0.5

## 2022-01-12 MED ORDER — LEVETIRACETAM IN NACL 500 MG/100ML IV SOLN
500.0000 mg | Freq: Once | INTRAVENOUS | Status: AC
  Administered 2022-01-12: 500 mg via INTRAVENOUS
  Filled 2022-01-12: qty 100

## 2022-01-12 MED ORDER — PROMETHAZINE HCL 25 MG RE SUPP: 25.0000 mg | Freq: Four times a day (QID) | RECTAL | 0 refills | Status: DC | PRN

## 2022-01-12 MED ORDER — DROPERIDOL 2.5 MG/ML IJ SOLN
1.2500 mg | Freq: Once | INTRAMUSCULAR | Status: AC
  Administered 2022-01-12: 1.25 mg via INTRAVENOUS
  Filled 2022-01-12: qty 2

## 2022-01-12 NOTE — ED Provider Notes (Signed)
[facility] DEPT Provider Note   CSN: [id number] Arrival date & time: 01/12/22  [id number]     History  Chief Complaint  Patient presents with   Nausea   Emesis    Melinda Calderon is a 21 y.o. female.  Pt is a 21 yo bf with a hx of seizures, anxiety, and gerd.  Pt drank a lot of alcohol last night and drank liquid marijuana.  She has been vomiting all night.  She did take her seizure meds, but she threw them up.  She had a seizure when she arrived.  No f/c.      Home Medications Prior to Admission medications   Medication Sig Start Date End Date Taking? Authorizing Provider  FYCOMPA 4 MG TABS Take 1 tablet at bedtime Patient taking differently: Take 4 mg by mouth at bedtime. 09/03/20  Yes Cameron Sprang, MD  lamoTRIgine (LAMICTAL) 100 MG tablet Take 2 tablets by mouth twice per day Patient taking differently: Take 100 mg by mouth 2 (two) times daily. 09/03/20  Yes Cameron Sprang, MD  levETIRAcetam (KEPPRA) 500 MG tablet Take  2 tabs in AM, 3 tabs in PM Patient taking differently: 1,000-1,500 mg See admin instructions. Take  2 tabs by mouth in the morning, then take  3 tablets by mouth in the evening per patient 08/16/21  Yes Cameron Sprang, MD  ondansetron (ZOFRAN ODT) 4 MG disintegrating tablet Take 1 tablet (4 mg total) by mouth every 8 (eight) hours as needed for nausea or vomiting. 12/31/20  Yes Montine Circle, PA-C  chlorhexidine (PERIDEX) 0.12 % solution Use as directed 15 mLs in the mouth or throat 2 (two) times daily. Swish and spit Patient not taking: Reported on 01/12/2022 04/09/21   Wieters, Hallie C, PA-C  ibuprofen (ADVIL) 800 MG tablet Take 1 tablet (800 mg total) by mouth 3 (three) times daily. Patient not taking: Reported on 01/12/2022 04/09/21   Wieters, Madelynn Done C, PA-C  omeprazole (PRILOSEC) 20 MG capsule Take 1 capsule (20 mg total) by mouth daily. Patient not taking: Reported on 01/12/2022 12/31/20   Montine Circle, PA-C  potassium  chloride (KLOR-CON) 10 MEQ tablet Take 1 tablet (10 mEq total) by mouth 2 (two) times daily for 5 days. Patient not taking: Reported on 01/12/2022 05/25/21 05/30/21  Alfredia Client, PA-C  promethazine (PHENERGAN) 25 MG tablet Take 1 tablet (25 mg total) by mouth every 6 (six) hours as needed for nausea or vomiting. Patient not taking: Reported on 01/12/2022 01/06/21   Debara Pickett C, PA-C  sertraline (ZOLOFT) 50 MG tablet Take 1 tablet each day Patient not taking: Reported on 01/12/2022 09/03/20   Cameron Sprang, MD  sucralfate (CARAFATE) 1 g tablet Take 1 tablet (1 g total) by mouth 4 (four) times daily -  with meals and at bedtime. Patient not taking: Reported on 01/12/2022 12/31/20   Montine Circle, PA-C  dicyclomine (BENTYL) 20 MG tablet Take 1 tablet (20 mg total) by mouth 2 (two) times daily. 06/28/20 12/31/20  Joy, Shawn C, PA-C  famotidine (PEPCID) 20 MG tablet Take 1 tablet (20 mg total) by mouth 2 (two) times daily. 10/07/20 12/31/20  Carmin Muskrat, MD      Allergies    Patient has no known allergies.    Review of Systems   Review of Systems  Gastrointestinal:  Positive for abdominal pain, nausea and vomiting.  All other systems reviewed and are negative.  Physical Exam Updated Vital Signs BP 127/89  Pulse 89    Temp 97.9 F (36.6 C) (Oral)    Resp 16    Ht 5' (1.524 m)    SpO2 100%    BMI 23.83 kg/m  Physical Exam Vitals and nursing note reviewed.  Constitutional:      General: She is not in acute distress.    Appearance: Normal appearance. She is ill-appearing.  HENT:     Head: Normocephalic and atraumatic.     Right Ear: External ear normal.     Left Ear: External ear normal.     Nose: Nose normal.     Mouth/Throat:     Mouth: Mucous membranes are dry.  Eyes:     Extraocular Movements: Extraocular movements intact.     Conjunctiva/sclera: Conjunctivae normal.     Pupils: Pupils are equal, round, and reactive to light.  Cardiovascular:     Rate and Rhythm: Regular  rhythm. Tachycardia present.     Pulses: Normal pulses.     Heart sounds: Normal heart sounds.  Pulmonary:     Effort: Pulmonary effort is normal.     Breath sounds: Normal breath sounds.  Abdominal:     General: Abdomen is flat. Bowel sounds are normal.     Palpations: Abdomen is soft.  Musculoskeletal:        General: Normal range of motion.     Cervical back: Normal range of motion and neck supple.  Skin:    General: Skin is warm.     Capillary Refill: Capillary refill takes less than 2 seconds.  Neurological:     General: No focal deficit present.     Mental Status: She is alert and oriented to person, place, and time.  Psychiatric:        Mood and Affect: Mood normal.        Behavior: Behavior normal.    ED Results / Procedures / Treatments   Labs (all labs ordered are listed, but only abnormal results are displayed) Labs Reviewed  COMPREHENSIVE METABOLIC PANEL - Abnormal; Notable for the following components:      Result Value   Potassium 3.4 (*)    CO2 21 (*)    Glucose, Bld 142 (*)    Total Protein 8.4 (*)    Albumin 5.1 (*)    AST 48 (*)    All other components within normal limits  CBC WITH DIFFERENTIAL/PLATELET - Abnormal; Notable for the following components:   WBC 13.0 (*)    Hemoglobin 15.2 (*)    Neutro Abs 10.9 (*)    All other components within normal limits  CBG MONITORING, ED - Abnormal; Notable for the following components:   Glucose-Capillary 157 (*)    All other components within normal limits  MAGNESIUM  ETHANOL  URINALYSIS, ROUTINE W REFLEX MICROSCOPIC  RAPID URINE DRUG SCREEN, HOSP PERFORMED  I-STAT BETA HCG BLOOD, ED ([facility], [facility], [facility] ONLY)    EKG None  Radiology No results found.  Procedures Procedures    Medications Ordered in ED Medications  sodium chloride 0.9 % bolus 1,000 mL (0 mLs Intravenous Stopped 01/12/22 1503)    And  0.9 %  sodium chloride infusion (0 mLs Intravenous Hold 01/12/22 1502)  promethazine (PHENERGAN) 12.5 mg  in sodium chloride 0.9 % 50 mL IVPB (12.5 mg Intravenous New Bag/Given 01/12/22 1532)  levETIRAcetam (KEPPRA) IVPB 500 mg/100 mL premix (0 mg Intravenous Stopped 01/12/22 1503)  droperidol (INAPSINE) 2.5 MG/ML injection 1.25 mg (1.25 mg Intravenous Given 01/12/22 0957)  sodium chloride  0.9 % bolus 1,000 mL (0 mLs Intravenous Stopped 01/12/22 1503)  sodium chloride 0.9 % bolus 1,000 mL (1,000 mLs Intravenous New Bag/Given 01/12/22 1532)    ED Course/ Medical Decision Making/ A&P                           Medical Decision Making Amount and/or Complexity of Data Reviewed Labs: ordered.  Risk Prescription drug management.   This patient presents to the ED for concern of seizure and n/v, this involves an extensive number of treatment options, and is a complaint that carries with it a high risk of complications and morbidity.  The differential diagnosis includes medication noncompliance, infections   Co morbidities that complicate the patient evaluation  Seizure d/o, mj use   Additional history obtained:  Additional history obtained from epic chart review External records from outside source obtained and reviewed including EMS   Lab Tests:  I Ordered, and personally interpreted labs.  The pertinent results include:  wbc is slightly elevated, cmp unremarkable.  UA and UDS pending   Imaging Studies ordered:  I ordered imaging studies including CT abd/pelvis  I independently visualized and interpreted imaging which showed pending results    Cardiac Monitoring:  The patient was maintained on a cardiac monitor.  I personally viewed and interpreted the cardiac monitored which showed an underlying rhythm of: nsr   Medicines ordered and prescription drug management:  I ordered medication including inapsine/phenergan/ivfs  for n/v and dehydration.  Keppra ordered for seizure  Reevaluation of the patient after these medicines showed that the patient improved I have reviewed the  patients home medicines and have made adjustments as needed   Test Considered:  CT abd/pelvis because of elevated wbc   Critical Interventions:  IVFs, meds   Problem List / ED Course:  N/v and dehydration:  likely due to alcohol and mj use. However, CT abd/pelvis ordered to make sure it is nothing else. Seizure:  Likely because she vomited her seizure meds and had alcohol and mj last night.  Pt given iv keppra.  She has had no additional seizures.   Reevaluation:  After the interventions noted above, I reevaluated the patient and found that they have :improved    Dispostion:  Pt is still having vomiting after fluids and meds.  I have ordered additional meds and fluids.  Pt signed out to Dr. Almyra Free at shift change.   Final Clinical Impression(s) / ED Diagnoses Final diagnoses:  Cannabinoid hyperemesis syndrome  Nausea and vomiting, unspecified vomiting type  Dehydration    Rx / DC Orders ED Discharge Orders     None         Isla Pence, MD 01/12/22 1539

## 2022-01-12 NOTE — ED Notes (Signed)
Ambulatory to restroom

## 2022-01-12 NOTE — ED Notes (Signed)
Patient able to tolerate juice at this time as PO challenge

## 2022-01-12 NOTE — Discharge Instructions (Signed)
Call your primary care doctor or specialist as discussed in the next 2-3 days.   Return immediately back to the ER if:  Your symptoms worsen within the next 12-24 hours. You develop new symptoms such as new fevers, persistent vomiting, new pain, shortness of breath, or new weakness or numbness, or if you have any other concerns.  

## 2022-01-12 NOTE — ED Provider Notes (Signed)
Patient signed out to me as pending p.o. challenge and follow-up of diagnostic studies.  Labs appear unremarkable urinalysis negative.  CT scan is unremarkable.  Patient has been given Phenergan now tolerating oral intake without vomiting.  Will be discharged home with a prescription of Phenergan.  Advised outpatient follow-up with her doctor in 2 to 3 days.  Advised immediate return if they are unable to keep down any fluids or worsening symptoms or have any additional concerns.   Cheryll Cockayne, MD 01/12/22 [phone]

## 2022-01-12 NOTE — ED Triage Notes (Signed)
Patient brought in via ems from home. Patient c/o N/V after drinking alcohol last night, states she has been feeling bad for 2 weeks. While coming through ER door, patient had seizure

## 2022-03-17 ENCOUNTER — Ambulatory Visit: Payer: Medicaid Other | Admitting: Neurology

## 2022-03-17 ENCOUNTER — Encounter: Payer: Self-pay | Admitting: Neurology

## 2022-03-26 ENCOUNTER — Ambulatory Visit: Payer: Medicaid Other | Admitting: Neurology

## 2022-11-26 ENCOUNTER — Encounter: Payer: Self-pay | Admitting: Neurology

## 2022-11-26 ENCOUNTER — Ambulatory Visit: Payer: Medicaid Other | Admitting: Neurology

## 2022-11-26 VITALS — BP 156/97 | HR 115 | Ht 60.0 in | Wt 130.0 lb

## 2022-11-26 DIAGNOSIS — R569 Unspecified convulsions: Secondary | ICD-10-CM

## 2022-11-26 DIAGNOSIS — G40309 Generalized idiopathic epilepsy and epileptic syndromes, not intractable, without status epilepticus: Secondary | ICD-10-CM

## 2022-11-26 DIAGNOSIS — H5712 Ocular pain, left eye: Secondary | ICD-10-CM

## 2022-11-26 MED ORDER — LAMOTRIGINE 100 MG PO TABS: ORAL_TABLET | ORAL | 11 refills | Status: DC

## 2022-11-26 MED ORDER — FYCOMPA 4 MG PO TABS: ORAL_TABLET | ORAL | 5 refills | Status: DC

## 2022-11-26 MED ORDER — LEVETIRACETAM 500 MG PO TABS: ORAL_TABLET | ORAL | 11 refills | Status: DC
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
## 2022-12-17 ENCOUNTER — Encounter: Payer: Self-pay | Admitting: Neurology

## 2023-03-03 ENCOUNTER — Ambulatory Visit: Payer: Medicaid Other | Admitting: Neurology

## 2023-03-09 ENCOUNTER — Ambulatory Visit: Payer: Medicaid Other | Admitting: Neurology

## 2023-03-20 ENCOUNTER — Ambulatory Visit: Payer: Medicaid Other | Admitting: Neurology

## 2023-03-20 ENCOUNTER — Other Ambulatory Visit (INDEPENDENT_AMBULATORY_CARE_PROVIDER_SITE_OTHER): Payer: Medicaid Other

## 2023-03-20 DIAGNOSIS — H5712 Ocular pain, left eye: Secondary | ICD-10-CM

## 2023-03-20 DIAGNOSIS — R569 Unspecified convulsions: Secondary | ICD-10-CM

## 2023-03-20 DIAGNOSIS — G40309 Generalized idiopathic epilepsy and epileptic syndromes, not intractable, without status epilepticus: Secondary | ICD-10-CM

## 2023-03-25 LAB — LAMOTRIGINE LEVEL: Lamotrigine Lvl: 0.6 ug/mL — ABNORMAL LOW (ref 2.5–15.0)

## 2023-03-25 LAB — LEVETIRACETAM LEVEL: Keppra (Levetiracetam): <2 ug/mL — ABNORMAL LOW

## 2023-04-01 ENCOUNTER — Telehealth: Payer: Self-pay

## 2023-04-01 NOTE — Telephone Encounter (Signed)
Pls have her use a pillbox to make sure she is not missing any doses, then recheck Keppra and Lamictal levels in 1 week. Thanks

## 2023-04-01 NOTE — Telephone Encounter (Signed)
Pt called no answer no voice mail picked up  ?

## 2023-04-01 NOTE — Telephone Encounter (Signed)
Pt called an was given her lab results  Keppra she is taken 2 tabs BID  Lamictal is is taken 3 tabs BID Fycompa 1 tab

## 2023-04-01 NOTE — Telephone Encounter (Signed)
-----   Message from Van Clines, MD sent at 03/30/2023 11:42 AM EDT ----- Pls let her know Keppra and Lamictal levels were very low, has she missed any doses? Pls confirm with patient how she is taking her medications.  Thanks

## 2023-04-02 NOTE — Telephone Encounter (Signed)
Pt called no answer no voice mail picked up  ?

## 2023-04-03 ENCOUNTER — Other Ambulatory Visit: Payer: Self-pay

## 2023-04-03 DIAGNOSIS — G40309 Generalized idiopathic epilepsy and epileptic syndromes, not intractable, without status epilepticus: Secondary | ICD-10-CM

## 2023-04-03 NOTE — Telephone Encounter (Signed)
Pt called no answer no voice mail picked up mychart message sent to pt

## 2023-04-20 ENCOUNTER — Encounter (HOSPITAL_COMMUNITY): Payer: Medicaid Other

## 2023-04-20 ENCOUNTER — Inpatient Hospital Stay (HOSPITAL_COMMUNITY): Admission: RE | Admit: 2023-04-20 | Payer: Medicaid Other | Source: Ambulatory Visit | Admitting: Neurology

## 2023-05-21 ENCOUNTER — Emergency Department (HOSPITAL_COMMUNITY)
Admission: EM | Admit: 2023-05-21 | Discharge: 2023-05-22 | Disposition: A | Discharge status: Home / Self Care | Payer: Medicaid Other | Attending: Emergency Medicine | Admitting: Emergency Medicine

## 2023-05-21 ENCOUNTER — Encounter (HOSPITAL_COMMUNITY): Payer: Self-pay

## 2023-05-21 ENCOUNTER — Emergency Department (HOSPITAL_COMMUNITY): Payer: Medicaid Other

## 2023-05-21 ENCOUNTER — Other Ambulatory Visit: Payer: Self-pay

## 2023-05-21 DIAGNOSIS — R000 Tachycardia, unspecified: Secondary | ICD-10-CM | POA: Diagnosis not present

## 2023-05-21 DIAGNOSIS — S01112A Laceration without foreign body of left eyelid and periocular area, initial encounter: Secondary | ICD-10-CM | POA: Insufficient documentation

## 2023-05-21 DIAGNOSIS — R55 Syncope and collapse: Secondary | ICD-10-CM | POA: Diagnosis not present

## 2023-05-21 DIAGNOSIS — W1830XA Fall on same level, unspecified, initial encounter: Secondary | ICD-10-CM | POA: Diagnosis not present

## 2023-05-21 DIAGNOSIS — R296 Repeated falls: Secondary | ICD-10-CM

## 2023-05-21 DIAGNOSIS — Z79899 Other long term (current) drug therapy: Secondary | ICD-10-CM | POA: Insufficient documentation

## 2023-05-21 LAB — CBC WITH DIFFERENTIAL/PLATELET
Abs Immature Granulocytes: 0.02 10*3/uL (ref 0.00–0.07)
Basophils Absolute: 0 10*3/uL (ref 0.0–0.1)
Basophils Relative: 0 %
Eosinophils Absolute: 0.1 10*3/uL (ref 0.0–0.5)
Eosinophils Relative: 1 %
HCT: 43.1 % (ref 36.0–46.0)
Hemoglobin: 14.3 g/dL (ref 12.0–15.0)
Immature Granulocytes: 0 %
Lymphocytes Relative: 33 %
Lymphs Abs: 2.9 10*3/uL (ref 0.7–4.0)
MCH: 32.3 pg (ref 26.0–34.0)
MCHC: 33.2 g/dL (ref 30.0–36.0)
MCV: 97.3 fL (ref 80.0–100.0)
Monocytes Absolute: 0.4 10*3/uL (ref 0.1–1.0)
Monocytes Relative: 5 %
Neutro Abs: 5.3 10*3/uL (ref 1.7–7.7)
Neutrophils Relative %: 61 %
Platelets: 250 10*3/uL (ref 150–400)
RBC: 4.43 MIL/uL (ref 3.87–5.11)
RDW: 13.5 % (ref 11.5–15.5)
WBC: 8.8 10*3/uL (ref 4.0–10.5)
nRBC: 0 % (ref 0.0–0.2)

## 2023-05-21 LAB — COMPREHENSIVE METABOLIC PANEL
ALT: 23 U/L (ref 0–44)
AST: 24 U/L (ref 15–41)
Albumin: 4.8 g/dL (ref 3.5–5.0)
Alkaline Phosphatase: 52 U/L (ref 38–126)
Anion gap: 14 (ref 5–15)
BUN: 10 mg/dL (ref 6–20)
CO2: 22 mmol/L (ref 22–32)
Calcium: 9.6 mg/dL (ref 8.9–10.3)
Chloride: 102 mmol/L (ref 98–111)
Creatinine, Ser: 0.86 mg/dL (ref 0.44–1.00)
GFR, Estimated: >60 mL/min (ref >60)
Glucose, Bld: 89 mg/dL (ref 70–99)
Potassium: 3.6 mmol/L (ref 3.5–5.1)
Sodium: 138 mmol/L (ref 135–145)
Total Bilirubin: 0.6 mg/dL (ref 0.3–1.2)
Total Protein: 7.2 g/dL (ref 6.5–8.1)

## 2023-05-21 LAB — ACETAMINOPHEN LEVEL: Acetaminophen (Tylenol), Serum: <10 ug/mL — ABNORMAL LOW (ref 10–30)

## 2023-05-21 LAB — ETHANOL: Alcohol, Ethyl (B): <10 mg/dL (ref <10)

## 2023-05-21 LAB — I-STAT BETA HCG BLOOD, ED (MC, WL, AP ONLY): I-stat hCG, quantitative: <5 m[IU]/mL (ref <5)

## 2023-05-21 LAB — SALICYLATE LEVEL: Salicylate Lvl: <7 mg/dL — ABNORMAL LOW (ref 7.0–30.0)

## 2023-05-21 LAB — MAGNESIUM: Magnesium: 2.1 mg/dL (ref 1.7–2.4)

## 2023-05-21 MED ORDER — LIDOCAINE-EPINEPHRINE (PF) 2 %-1:200000 IJ SOLN
20.0000 mL | Freq: Once | INTRAMUSCULAR | Status: AC
  Administered 2023-05-21: 20 mL
  Filled 2023-05-21: qty 20

## 2023-05-21 MED ORDER — LEVETIRACETAM IN NACL 1000 MG/100ML IV SOLN
1000.0000 mg | Freq: Once | INTRAVENOUS | Status: AC
  Administered 2023-05-21: 1000 mg via INTRAVENOUS
  Filled 2023-05-21: qty 100

## 2023-05-21 NOTE — ED Provider Notes (Signed)
[place] EMERGENCY DEPARTMENT AT [facility] Provider Note   CSN: [id number] Arrival date & time: 05/21/23  2201     History  Chief Complaint  Patient presents with   Fall   Loss of Consciousness    Melinda Calderon is a 22 y.o. female with history of seizures on Lamotrigine, Levetiracetam, and Fycompa, brought in by EMS from home.  It is assumed she suffered a fall but she is unsure what happened.  She is unsure if she had a seizure. All she remembers is waking up on the floor. Denies any substance use. States she was recently started on new medication for her seizures that starts within the past but is unsure what this medication is she states this medication is making her feel like she is "on cloud 9". Endorses head, neck, and back pain currently. Denies any abdominal pain.    Fall  Loss of Consciousness Associated symptoms: no palpitations        Home Medications Prior to Admission medications   Medication Sig Start Date End Date Taking? Authorizing Provider  chlorhexidine (PERIDEX) 0.12 % solution Use as directed 15 mLs in the mouth or throat 2 (two) times daily. Swish and spit Patient not taking: Reported on 01/12/2022 04/09/21   Lew Dawes, PA-C  FYCOMPA 4 MG TABS Take 1 tablet at bedtime 11/26/22   Van Clines, MD  ibuprofen (ADVIL) 800 MG tablet Take 1 tablet (800 mg total) by mouth 3 (three) times daily. 04/09/21   Wieters, Hallie C, PA-C  lamoTRIgine (LAMICTAL) 100 MG tablet Take 2 tablets by mouth twice per day 11/26/22   Van Clines, MD  levETIRAcetam (KEPPRA) 500 MG tablet Take  3 tabs in AM, 3 tabs in PM 11/26/22   Van Clines, MD  omeprazole (PRILOSEC) 20 MG capsule Take 1 capsule (20 mg total) by mouth daily. 12/31/20   Roxy Horseman, PA-C  ondansetron (ZOFRAN ODT) 4 MG disintegrating tablet Take 1 tablet (4 mg total) by mouth every 8 (eight) hours as needed for nausea or vomiting. 12/31/20   Roxy Horseman, PA-C  potassium  chloride (KLOR-CON) 10 MEQ tablet Take 1 tablet (10 mEq total) by mouth 2 (two) times daily for 5 days. 05/25/21 11/26/22  Farrel Gordon, PA-C  promethazine (PHENERGAN) 25 MG suppository Place 1 suppository (25 mg total) rectally every 6 (six) hours as needed for up to 5 doses for nausea or vomiting. 01/12/22   Cheryll Cockayne, MD  sertraline (ZOLOFT) 50 MG tablet Take 1 tablet each day 09/03/20   Van Clines, MD  sucralfate (CARAFATE) 1 g tablet Take 1 tablet (1 g total) by mouth 4 (four) times daily -  with meals and at bedtime. 12/31/20   Roxy Horseman, PA-C  dicyclomine (BENTYL) 20 MG tablet Take 1 tablet (20 mg total) by mouth 2 (two) times daily. 06/28/20 12/31/20  Joy, Shawn C, PA-C  famotidine (PEPCID) 20 MG tablet Take 1 tablet (20 mg total) by mouth 2 (two) times daily. 10/07/20 12/31/20  Gerhard Munch, MD      Allergies    Patient has no known allergies.    Review of Systems   Review of Systems  Cardiovascular:  Positive for syncope. Negative for palpitations.    Physical Exam Updated Vital Signs BP 117/80   Pulse 72   Temp 97.7 F (36.5 C) (Oral)   Resp 18   Ht 5' (1.524 m)   Wt 54.4 kg   SpO2 100%  BMI 23.44 kg/m  Physical Exam Vitals and nursing note reviewed.  Constitutional:      Comments: Slow speech, somnolent   Eyes:     Pupils: Pupils are equal, round, and reactive to light.     Comments: Has difficulty following commands to test EOM, EOM intact  Cardiovascular:     Rate and Rhythm: Regular rhythm. Tachycardia present.     Heart sounds: Normal heart sounds. No murmur heard. Pulmonary:     Effort: Pulmonary effort is normal.     Breath sounds: Normal breath sounds.  Abdominal:     General: Abdomen is flat.     Palpations: Abdomen is soft.     Tenderness: There is no abdominal tenderness. There is no guarding.  Skin:    Comments: Approximately 3 cm laceration of the left eyebrow  Neurological:     Cranial Nerves: No cranial nerve deficit.     ED  Results / Procedures / Treatments   Labs (all labs ordered are listed, but only abnormal results are displayed) Labs Reviewed  SALICYLATE LEVEL - Abnormal; Notable for the following components:      Result Value   Salicylate Lvl <7.0 (*)    All other components within normal limits  ACETAMINOPHEN LEVEL - Abnormal; Notable for the following components:   Acetaminophen (Tylenol), Serum <10 (*)    All other components within normal limits  RAPID URINE DRUG SCREEN, HOSP PERFORMED - Abnormal; Notable for the following components:   Tetrahydrocannabinol POSITIVE (*)    All other components within normal limits  PREGNANCY, URINE  COMPREHENSIVE METABOLIC PANEL  CBC WITH DIFFERENTIAL/PLATELET  MAGNESIUM  ETHANOL  LAMOTRIGINE LEVEL  CBG MONITORING, ED  I-STAT BETA HCG BLOOD, ED ([facility], [facility], [facility] ONLY)    EKG EKG Interpretation  Date/Time:  Thursday May 21 2023 22:14:25 EDT Ventricular Rate:  98 PR Interval:  180 QRS Duration: 82 QT Interval:  330 QTC Calculation: 421 R Axis:   85 Text Interpretation: Normal sinus rhythm Normal ECG When compared with ECG of 25-May-2021 06:39, PREVIOUS ECG IS PRESENT Confirmed by Marily Memos [phone]) on 05/22/2023 1:19:22 AM  Radiology CT Head Wo Contrast  Result Date: 05/22/2023 CLINICAL DATA:  Trauma. EXAM: CT HEAD WITHOUT CONTRAST CT CERVICAL SPINE WITHOUT CONTRAST TECHNIQUE: Multidetector CT imaging of the head and cervical spine was performed following the standard protocol without intravenous contrast. Multiplanar CT image reconstructions of the cervical spine were also generated. RADIATION DOSE REDUCTION: This exam was performed according to the departmental dose-optimization program which includes automated exposure control, adjustment of the mA and/or kV according to patient size and/or use of iterative reconstruction technique. COMPARISON:  CT dated 01/04/2018. FINDINGS: CT HEAD FINDINGS Brain: The ventricles and sulci are appropriate size for the  patient's age. The gray-white matter discrimination is preserved. There is no acute intracranial hemorrhage. No mass effect or midline shift. No extra-axial fluid collection. Vascular: No hyperdense vessel or unexpected calcification. Skull: Normal. Negative for fracture or focal lesion. Sinuses/Orbits: No acute finding. Other: Left forehead laceration. CT CERVICAL SPINE FINDINGS Alignment: Normal. Skull base and vertebrae: No acute fracture. No primary bone lesion or focal pathologic process. Soft tissues and spinal canal: No prevertebral fluid or swelling. No visible canal hematoma. Disc levels:  No acute findings.  No degenerative changes. Upper chest: Negative. Other: None IMPRESSION: 1. No acute intracranial pathology. 2. Left forehead laceration. 3. No acute/traumatic cervical spine pathology. Electronically Signed   By: Elgie Collard M.D.   On: 05/22/2023 00:06  CT Cervical Spine Wo Contrast  Result Date: 05/22/2023 CLINICAL DATA:  Trauma. EXAM: CT HEAD WITHOUT CONTRAST CT CERVICAL SPINE WITHOUT CONTRAST TECHNIQUE: Multidetector CT imaging of the head and cervical spine was performed following the standard protocol without intravenous contrast. Multiplanar CT image reconstructions of the cervical spine were also generated. RADIATION DOSE REDUCTION: This exam was performed according to the departmental dose-optimization program which includes automated exposure control, adjustment of the mA and/or kV according to patient size and/or use of iterative reconstruction technique. COMPARISON:  CT dated 01/04/2018. FINDINGS: CT HEAD FINDINGS Brain: The ventricles and sulci are appropriate size for the patient's age. The gray-white matter discrimination is preserved. There is no acute intracranial hemorrhage. No mass effect or midline shift. No extra-axial fluid collection. Vascular: No hyperdense vessel or unexpected calcification. Skull: Normal. Negative for fracture or focal lesion. Sinuses/Orbits: No acute  finding. Other: Left forehead laceration. CT CERVICAL SPINE FINDINGS Alignment: Normal. Skull base and vertebrae: No acute fracture. No primary bone lesion or focal pathologic process. Soft tissues and spinal canal: No prevertebral fluid or swelling. No visible canal hematoma. Disc levels:  No acute findings.  No degenerative changes. Upper chest: Negative. Other: None IMPRESSION: 1. No acute intracranial pathology. 2. Left forehead laceration. 3. No acute/traumatic cervical spine pathology. Electronically Signed   By: Elgie Collard M.D.   On: 05/22/2023 00:06   CT Lumbar Spine Wo Contrast  Result Date: 05/22/2023 CLINICAL DATA:  Fall EXAM: CT LUMBAR SPINE WITHOUT CONTRAST TECHNIQUE: Multidetector CT imaging of the lumbar spine was performed without intravenous contrast administration. Multiplanar CT image reconstructions were also generated. RADIATION DOSE REDUCTION: This exam was performed according to the departmental dose-optimization program which includes automated exposure control, adjustment of the mA and/or kV according to patient size and/or use of iterative reconstruction technique. COMPARISON:  None Available. FINDINGS: Segmentation: 5 lumbar type vertebrae. Alignment: Normal. Vertebrae: No acute fracture or focal pathologic process. Paraspinal and other soft tissues: Negative. Disc levels: No spinal canal stenosis. IMPRESSION: No acute fracture or traumatic malalignment of the lumbar spine. Electronically Signed   By: Deatra Robinson M.D.   On: 05/22/2023 00:05    Procedures .Marland KitchenLaceration Repair  Date/Time: 05/21/2023 11:53 PM  Performed by: Arabella Merles, PA-C Authorized by: Arabella Merles, PA-C   Consent:    Consent obtained:  Verbal   Consent given by:  Patient   Risks, benefits, and alternatives were discussed: yes     Risks discussed:  Infection, poor cosmetic result and poor wound healing   Alternatives discussed:  No treatment Universal protocol:    Procedure explained  and questions answered to patient or proxy's satisfaction: yes     Patient identity confirmed:  Verbally with patient Anesthesia:    Anesthesia method:  Local infiltration   Local anesthetic:  Lidocaine 2% WITH epi Laceration details:    Location: Left eyebrow.   Length (cm):  3 Pre-procedure details:    Preparation:  Patient was prepped and draped in usual sterile fashion Treatment:    Area cleansed with:  Povidone-iodine   Amount of cleaning:  Standard   Irrigation solution:  Sterile saline   Irrigation method:  Syringe Skin repair:    Repair method:  Sutures   Suture size:  5-0   Wound skin closure material used: Vicryl.   Suture technique:  Subcuticular Post-procedure details:    Dressing:  Antibiotic ointment (Gauze)   Procedure completion:  Tolerated well, no immediate complications     Medications Ordered in ED Medications  lidocaine-EPINEPHrine (XYLOCAINE W/EPI) 2 %-1:200000 (PF) injection 20 mL (20 mLs Infiltration Given 05/21/23 2238)  levETIRAcetam (KEPPRA) IVPB 1000 mg/100 mL premix (0 mg Intravenous Stopped 05/21/23 2255)    ED Course/ Medical Decision Making/ A&P                             Medical Decision Making Amount and/or Complexity of Data Reviewed Labs: ordered. Radiology: ordered.  Risk Prescription drug management.   22 y.o. female with pertinent past medical history of seizures on Lamictal and another unknown medication presents to the ED by EMS for concern of fall and loss of consciousness  Differential diagnosis includes but is not limited to seizure, arrhythmia, electrolyte imbalance, postictal state, substance abuse, brain bleed.   ED Course:  10:30 PM: Upon evaluation patient is altered with slurred speech and slowed movements but is unclear if this could be a postictal state vs substance use vs brain bleed.  Patient is unsure what happened but endorses loss of consciousness.  Also endorses, neck, back pain will obtain CT head, CT  cervical spine, CT lumbar spine.  Will evaluate for substance use with urinalysis, acetaminophen level, ethanol, salicylate level.  Will obtain lamotrigine level given that she is taking this medication for seizures.  She also has a laceration of the left eyebrow, will suture after she is brought back from CT.  She was given 1 g of Keppra IV. 12:00 AM, upon re-evaluation she is more alert and talking in complete sentences without slowed speech. Labs are unremarkable, do not suspect this was due to electrolyte imbalance.  Urine drug screen only significant for THC, do not think this was a loss of consciousness due to substance use.  EKG shows normal sinus rhythm, arrhythmias less likely given NSR and she has responded well to Keppra.  Her left eyebrow laceration was repaired with running subcutaneous sutures without complication.    Impression: Left eyebrow laceration Loss of consciousness Unwitnessed fall  Disposition:  The patient was discharged home with instructions to follow-up with her neurologist Dr. Patrcia Dolly discuss her seizure medications since she feels they are making her feel "on cloud 9".  Absorbable sutures placed, she does not need to get these removed.  Discussed that she should keep her laceration clean and may apply antibiotic ointment to it.  Return precautions given   Lab Tests: I Ordered, and personally interpreted labs.  The pertinent results include:   CBC, CMP, magnesium unremarkable Slight, acetaminophen, ethanol level unremarkable Lamotrigine level has not resulted, she can follow this up with her neurologist outpatient UDS positive for THC I stat hCG less than 5  Imaging Studies ordered: I ordered imaging studies including CT head, CT cervical spine, CT lumbar spine I independently visualized the imaging with scope of interpretation limited to determining acute life threatening conditions related to emergency care. Imaging showed no acute fractures, no brain  bleed I agree with the radiologist interpretation   Cardiac Monitoring: / EKG: The patient was maintained on a cardiac monitor.  I personally viewed and interpreted the cardiac monitored which showed an underlying rhythm of: Tachycardia   Consultations Obtained: None  External records from outside source obtained and reviewed including Neurology visit note   Co morbidities that complicate the patient evaluation  Seizure disorder   Social Determinants of Health:  Unknown            Final Clinical Impression(s) / ED Diagnoses Final diagnoses:  Laceration  of left eyebrow, initial encounter  Unwitnessed fall    Rx / DC Orders ED Discharge Orders     None         Arabella Merles, Cordelia Poche 05/22/23 0223    Mardene Sayer, MD 05/22/23 [phone]

## 2023-05-21 NOTE — ED Provider Notes (Incomplete)
[place] EMERGENCY DEPARTMENT AT [facility] Provider Note   CSN: [id number] Arrival date & time: 05/21/23  2201     History {Add pertinent medical, surgical, social history, OB history to HPI:1} Chief Complaint  Patient presents with  . Fall  . Loss of Consciousness    Melinda Calderon is a 22 y.o. female with history of seizures on Lamictal and another unknown seizure medication, brought in by EMS from home.  It is assumed she suffered a fall but she is unsure what happened.  She is unsure if she had a seizure. All she remembers is waking up on the floor. Denies any substance use. States she was recently started on new medication for her seizures that starts within the past but is unsure what this medication is she states this medication is making her feel funny. Endorses head, neck, and back pain currently. Denies any abdominal pain.    Fall  Loss of Consciousness Associated symptoms: no palpitations        Home Medications Prior to Admission medications   Medication Sig Start Date End Date Taking? Authorizing Provider  chlorhexidine (PERIDEX) 0.12 % solution Use as directed 15 mLs in the mouth or throat 2 (two) times daily. Swish and spit Patient not taking: Reported on 01/12/2022 04/09/21   Lew Dawes, PA-C  FYCOMPA 4 MG TABS Take 1 tablet at bedtime 11/26/22   Van Clines, MD  ibuprofen (ADVIL) 800 MG tablet Take 1 tablet (800 mg total) by mouth 3 (three) times daily. 04/09/21   Wieters, Hallie C, PA-C  lamoTRIgine (LAMICTAL) 100 MG tablet Take 2 tablets by mouth twice per day 11/26/22   Van Clines, MD  levETIRAcetam (KEPPRA) 500 MG tablet Take  3 tabs in AM, 3 tabs in PM 11/26/22   Van Clines, MD  omeprazole (PRILOSEC) 20 MG capsule Take 1 capsule (20 mg total) by mouth daily. 12/31/20   Roxy Horseman, PA-C  ondansetron (ZOFRAN ODT) 4 MG disintegrating tablet Take 1 tablet (4 mg total) by mouth every 8 (eight) hours as needed for nausea or  vomiting. 12/31/20   Roxy Horseman, PA-C  potassium chloride (KLOR-CON) 10 MEQ tablet Take 1 tablet (10 mEq total) by mouth 2 (two) times daily for 5 days. 05/25/21 11/26/22  Farrel Gordon, PA-C  promethazine (PHENERGAN) 25 MG suppository Place 1 suppository (25 mg total) rectally every 6 (six) hours as needed for up to 5 doses for nausea or vomiting. 01/12/22   Cheryll Cockayne, MD  sertraline (ZOLOFT) 50 MG tablet Take 1 tablet each day 09/03/20   Van Clines, MD  sucralfate (CARAFATE) 1 g tablet Take 1 tablet (1 g total) by mouth 4 (four) times daily -  with meals and at bedtime. 12/31/20   Roxy Horseman, PA-C  dicyclomine (BENTYL) 20 MG tablet Take 1 tablet (20 mg total) by mouth 2 (two) times daily. 06/28/20 12/31/20  Joy, Shawn C, PA-C  famotidine (PEPCID) 20 MG tablet Take 1 tablet (20 mg total) by mouth 2 (two) times daily. 10/07/20 12/31/20  Gerhard Munch, MD      Allergies    Patient has no known allergies.    Review of Systems   Review of Systems  Cardiovascular:  Positive for syncope. Negative for palpitations.    Physical Exam Updated Vital Signs BP 124/84 (BP Location: Right Arm)   Pulse (!) 101   Temp 97.7 F (36.5 C)   Resp 16   Ht 5' (1.524 m)  Wt 54.4 kg   SpO2 98%   BMI 23.44 kg/m  Physical Exam Vitals and nursing note reviewed.  Constitutional:      Comments: Slurred speech, somnolent   Eyes:     Pupils: Pupils are equal, round, and reactive to light.     Comments: Has difficulty following commands to test EOM, EOM intact  Cardiovascular:     Rate and Rhythm: Regular rhythm. Tachycardia present.     Heart sounds: Normal heart sounds. No murmur heard. Pulmonary:     Effort: Pulmonary effort is normal.     Breath sounds: Normal breath sounds.  Abdominal:     General: Abdomen is flat.     Palpations: Abdomen is soft.     Tenderness: There is no abdominal tenderness. There is no guarding.  Skin:    Comments: Approximately 3 cm laceration of the left  eyebrow  Neurological:     Cranial Nerves: No cranial nerve deficit.     ED Results / Procedures / Treatments   Labs (all labs ordered are listed, but only abnormal results are displayed) Labs Reviewed  SALICYLATE LEVEL - Abnormal; Notable for the following components:      Result Value   Salicylate Lvl <7.0 (*)    All other components within normal limits  ACETAMINOPHEN LEVEL - Abnormal; Notable for the following components:   Acetaminophen (Tylenol), Serum <10 (*)    All other components within normal limits  COMPREHENSIVE METABOLIC PANEL  CBC WITH DIFFERENTIAL/PLATELET  MAGNESIUM  ETHANOL  PREGNANCY, URINE  RAPID URINE DRUG SCREEN, HOSP PERFORMED  LAMOTRIGINE LEVEL  I-STAT BETA HCG BLOOD, ED ([facility], [facility], [facility] ONLY)  CBG MONITORING, ED    EKG None  Radiology No results found.  Procedures .Marland KitchenLaceration Repair  Date/Time: 05/21/2023 11:53 PM  Performed by: Arabella Merles, PA-C Authorized by: Arabella Merles, PA-C   Consent:    Consent obtained:  Verbal   Consent given by:  Patient   Risks, benefits, and alternatives were discussed: yes     Risks discussed:  Infection, poor cosmetic result and poor wound healing   Alternatives discussed:  No treatment Universal protocol:    Procedure explained and questions answered to patient or proxy's satisfaction: yes     Patient identity confirmed:  Verbally with patient Anesthesia:    Anesthesia method:  Local infiltration   Local anesthetic:  Lidocaine 2% WITH epi Laceration details:    Location: Left eyebrow.   Length (cm):  3 Pre-procedure details:    Preparation:  Patient was prepped and draped in usual sterile fashion Treatment:    Area cleansed with:  Povidone-iodine   Amount of cleaning:  Standard   Irrigation solution:  Sterile saline   Irrigation method:  Syringe Skin repair:    Repair method:  Sutures   Suture size:  5-0   Wound skin closure material used: Vicryl.   Suture technique:   Subcuticular Post-procedure details:    Dressing:  Antibiotic ointment (Gauze)   Procedure completion:  Tolerated well, no immediate complications   {Document cardiac monitor, telemetry assessment procedure when appropriate:1}  Medications Ordered in ED Medications  lidocaine-EPINEPHrine (XYLOCAINE W/EPI) 2 %-1:200000 (PF) injection 20 mL (20 mLs Infiltration Given 05/21/23 2238)  levETIRAcetam (KEPPRA) IVPB 1000 mg/100 mL premix (1,000 mg Intravenous New Bag/Given 05/21/23 2240)    ED Course/ Medical Decision Making/ A&P   {   Click here for ABCD2, HEART and other calculatorsREFRESH Note before signing :1}  Medical Decision Making Amount and/or Complexity of Data Reviewed Labs: ordered. Radiology: ordered.  Risk Prescription drug management.   22 y.o. female with pertinent past medical history of seizures on Lamictal and another unknown medication presents to the ED by EMS for concern of fall and loss of consciousness  Differential diagnosis includes but is not limited to seizure, arrhythmia, postictal state, substance abuse, brain bleed.   ED Course:  10:30 PM: Upon evaluation patient is altered with slurred speech and slowed movements but is unclear if this could be a postictal state vs substance use vs brain bleed.  Patient is unsure what happened but endorses loss of consciousness.  Also endorses, neck, back pain will obtain CT head, CT cervical spine, CT lumbar spine.  Will evaluate for substance use with urinalysis, acetaminophen level, ethanol, salicylate level.  Will obtain lamotrigine level given that she is taking this medication for seizures.  She also has a laceration of the left eyebrow, will suture after she is brought back from CT.  She was given 1 g of Keppra IV. Upon re-evaluation, patient ***.    Impression: ***  Disposition:  {AF ED Dispo:29713}   Lab Tests: I Ordered, and personally interpreted labs.  The pertinent results  include:   CBC, CMP, magnesium unremarkable Slight, acetaminophen, ethanol level unremarkable Lamotrigine *** UDS *** I stat hCG less than 5  Imaging Studies ordered: I ordered imaging studies including CT head, CT cervical spine, CT lumbar spine I independently visualized the imaging with scope of interpretation limited to determining acute life threatening conditions related to emergency care. Imaging showed *** I agree with the radiologist interpretation   Cardiac Monitoring: / EKG: The patient was maintained on a cardiac monitor.  I personally viewed and interpreted the cardiac monitored which showed an underlying rhythm of: Tachycardia   Consultations Obtained: I requested consultation with the ***,  and discussed lab and imaging findings as well as pertinent plan - they recommend: ***Additional history obtained from ***  External records from outside source obtained and reviewed including ***   Co morbidities that complicate the patient evaluation  Seizure disorder   Social Determinants of Health:  Unknown      {Document critical care time when appropriate:1} {Document review of labs and clinical decision tools ie heart score, Chads2Vasc2 etc:1}  {Document your independent review of radiology images, and any outside records:1} {Document your discussion with family members, caretakers, and with consultants:1} {Document social determinants of health affecting pt's care:1} {Document your decision making why or why not admission, treatments were needed:1} Final Clinical Impression(s) / ED Diagnoses Final diagnoses:  None    Rx / DC Orders ED Discharge Orders     None

## 2023-05-21 NOTE — ED Triage Notes (Signed)
Patient BIB [facility] from home with complaint of GLF today with LOC. Laceration to right eyebrow  A & O x 4

## 2023-05-22 LAB — RAPID URINE DRUG SCREEN, HOSP PERFORMED
Amphetamines: NOT DETECTED
Barbiturates: NOT DETECTED
Benzodiazepines: NOT DETECTED
Cocaine: NOT DETECTED
Opiates: NOT DETECTED
Tetrahydrocannabinol: POSITIVE — AB

## 2023-05-22 LAB — PREGNANCY, URINE: Preg Test, Ur: NEGATIVE

## 2023-05-22 NOTE — Discharge Instructions (Addendum)
Your CT scans showed no concerns for any fractures or brain bleeds.   Please schedule an appointment with your neurologist within the next week to discuss your seizure medications.   Your forehead laceration has was sutured with absorbable sutures. These do not need to be removed and will reabsorb with time. Please keep your laceration clean. You may apply antibiotic ointment to it.   Return to the ER if your laceration looks infected (red, swollen, you notice pus draining, you develop a fever), experience any more falls or loss of consciousness, you have palpitations.

## 2023-05-25 LAB — LAMOTRIGINE LEVEL: Lamotrigine Lvl: 5 ug/mL (ref 2.0–20.0)

## 2023-05-26 ENCOUNTER — Emergency Department (HOSPITAL_COMMUNITY)
Admission: EM | Admit: 2023-05-26 | Discharge: 2023-05-26 | Disposition: A | Discharge status: Home / Self Care | Payer: Medicaid Other | Attending: Emergency Medicine | Admitting: Emergency Medicine

## 2023-05-26 ENCOUNTER — Other Ambulatory Visit: Payer: Self-pay

## 2023-05-26 ENCOUNTER — Telehealth: Payer: Self-pay | Admitting: Neurology

## 2023-05-26 DIAGNOSIS — G40909 Epilepsy, unspecified, not intractable, without status epilepticus: Secondary | ICD-10-CM | POA: Diagnosis not present

## 2023-05-26 DIAGNOSIS — S00212A Abrasion of left eyelid and periocular area, initial encounter: Secondary | ICD-10-CM | POA: Insufficient documentation

## 2023-05-26 DIAGNOSIS — X58XXXA Exposure to other specified factors, initial encounter: Secondary | ICD-10-CM | POA: Diagnosis not present

## 2023-05-26 DIAGNOSIS — R569 Unspecified convulsions: Secondary | ICD-10-CM | POA: Diagnosis present

## 2023-05-26 DIAGNOSIS — S60512A Abrasion of left hand, initial encounter: Secondary | ICD-10-CM | POA: Diagnosis not present

## 2023-05-26 DIAGNOSIS — S00512A Abrasion of oral cavity, initial encounter: Secondary | ICD-10-CM | POA: Diagnosis not present

## 2023-05-26 LAB — CBC WITH DIFFERENTIAL/PLATELET
Abs Immature Granulocytes: 0.01 10*3/uL (ref 0.00–0.07)
Basophils Absolute: 0 10*3/uL (ref 0.0–0.1)
Basophils Relative: 0 %
Eosinophils Absolute: 0.1 10*3/uL (ref 0.0–0.5)
Eosinophils Relative: 1 %
HCT: 35.7 % — ABNORMAL LOW (ref 36.0–46.0)
Hemoglobin: 11.8 g/dL — ABNORMAL LOW (ref 12.0–15.0)
Immature Granulocytes: 0 %
Lymphocytes Relative: 49 %
Lymphs Abs: 3.7 10*3/uL (ref 0.7–4.0)
MCH: 32.1 pg (ref 26.0–34.0)
MCHC: 33.1 g/dL (ref 30.0–36.0)
MCV: 97 fL (ref 80.0–100.0)
Monocytes Absolute: 0.6 10*3/uL (ref 0.1–1.0)
Monocytes Relative: 8 %
Neutro Abs: 3.2 10*3/uL (ref 1.7–7.7)
Neutrophils Relative %: 42 %
Platelets: 233 10*3/uL (ref 150–400)
RBC: 3.68 MIL/uL — ABNORMAL LOW (ref 3.87–5.11)
RDW: 13.2 % (ref 11.5–15.5)
WBC: 7.6 10*3/uL (ref 4.0–10.5)
nRBC: 0 % (ref 0.0–0.2)

## 2023-05-26 LAB — COMPREHENSIVE METABOLIC PANEL
ALT: 26 U/L (ref 0–44)
AST: 29 U/L (ref 15–41)
Albumin: 4.3 g/dL (ref 3.5–5.0)
Alkaline Phosphatase: 60 U/L (ref 38–126)
Anion gap: 11 (ref 5–15)
BUN: 15 mg/dL (ref 6–20)
CO2: 19 mmol/L — ABNORMAL LOW (ref 22–32)
Calcium: 8.9 mg/dL (ref 8.9–10.3)
Chloride: 108 mmol/L (ref 98–111)
Creatinine, Ser: 0.67 mg/dL (ref 0.44–1.00)
GFR, Estimated: >60 mL/min (ref >60)
Glucose, Bld: 92 mg/dL (ref 70–99)
Potassium: 3.4 mmol/L — ABNORMAL LOW (ref 3.5–5.1)
Sodium: 138 mmol/L (ref 135–145)
Total Bilirubin: 0.4 mg/dL (ref 0.3–1.2)
Total Protein: 6.7 g/dL (ref 6.5–8.1)

## 2023-05-26 LAB — I-STAT BETA HCG BLOOD, ED (MC, WL, AP ONLY): I-stat hCG, quantitative: <5 m[IU]/mL (ref <5)

## 2023-05-26 LAB — CBG MONITORING, ED: Glucose-Capillary: 102 mg/dL — ABNORMAL HIGH (ref 70–99)

## 2023-05-26 MED ORDER — SODIUM CHLORIDE 0.9 % IV BOLUS
1000.0000 mL | Freq: Once | INTRAVENOUS | Status: AC
  Administered 2023-05-26: 1000 mL via INTRAVENOUS

## 2023-05-26 MED ORDER — LAMOTRIGINE 100 MG PO TABS
300.0000 mg | ORAL_TABLET | Freq: Two times a day (BID) | ORAL | Status: DC
  Administered 2023-05-26: 300 mg via ORAL
  Filled 2023-05-26: qty 3

## 2023-05-26 MED ORDER — LEVETIRACETAM 500 MG PO TABS
500.0000 mg | ORAL_TABLET | Freq: Once | ORAL | Status: AC
  Administered 2023-05-26: 500 mg via ORAL
  Filled 2023-05-26: qty 1

## 2023-05-26 NOTE — Discharge Instructions (Signed)
Please make sure to take your antiseizure medication as prescribed.  Call and follow-up closely with your neurologist for further managements of your recurrent seizure.  Return to the ER if your condition worsen or if you have other concern.
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
## 2023-05-26 NOTE — Telephone Encounter (Signed)
Pt is calling in stating that she was just D/C from the hospital from having a seizure and was instructed to get in contact with her provider.  Dr. Karel Jarvis does not have anything available please advise where to schedule her.
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
## 2023-05-26 NOTE — ED Notes (Signed)
RN spoke with mom and given update on pt. Mom reports she is attempting to arrange ride for pt to go home.

## 2023-05-29 NOTE — Telephone Encounter (Signed)
Ok to put on waitlist for cancellation, pls remind her about our no-show policy, if she misses next appt, we are unable to see her after. Thanks

## 2023-06-11 ENCOUNTER — Ambulatory Visit: Payer: Medicaid Other | Admitting: Neurology

## 2023-06-16 ENCOUNTER — Encounter: Payer: Self-pay | Admitting: Neurology

## 2023-06-16 ENCOUNTER — Ambulatory Visit: Payer: Medicaid Other | Admitting: Neurology

## 2023-09-17 ENCOUNTER — Ambulatory Visit: Payer: Medicaid Other | Admitting: Neurology

## 2023-10-20 ENCOUNTER — Ambulatory Visit: Payer: Medicaid Other | Admitting: Neurology

## 2023-10-20 ENCOUNTER — Encounter: Payer: Self-pay | Admitting: Neurology

## 2023-10-20 IMAGING — CT CT ABD-PELV W/ CM
2 of 4 series · 15 of 46 positions shown, 17 images · IV contrast (agent unspecified)
Comparison: 05/25/2021.

CLINICAL DATA: Nonlocalized abdominal pain with vomiting.

EXAM:
CT ABDOMEN AND PELVIS WITH CONTRAST
TECHNIQUE: Multidetector CT imaging of the abdomen and pelvis was performed
using the standard protocol following bolus administration of
intravenous contrast.

[Series 2: axial st · axial · 0.72mm/px · z∈[-422,-27]mm · 12 of 89 slices shown, 14 images]
[im 5/89  soft-tissue]
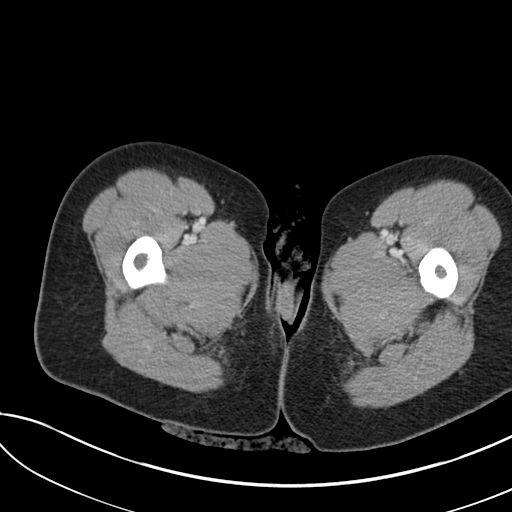
[im 5/89  bone]
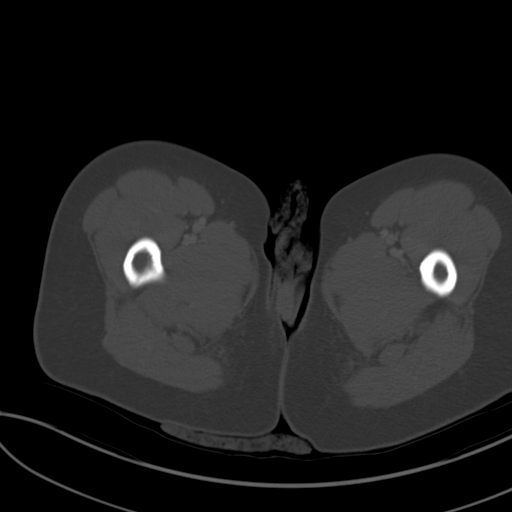
[im 14/89  soft-tissue]
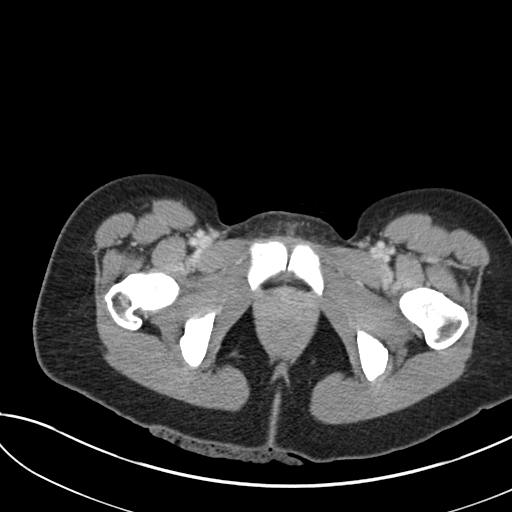
[im 18/89  soft-tissue]
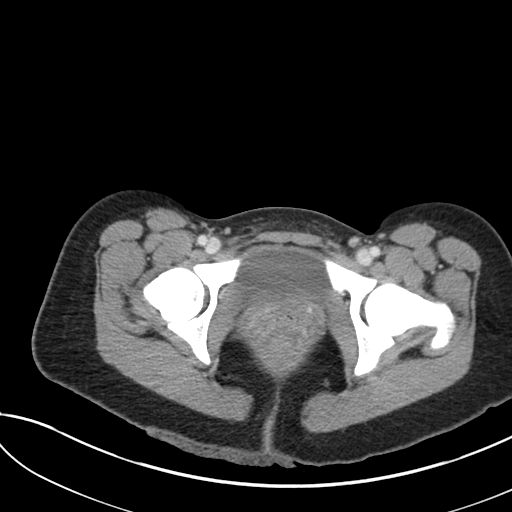
[im 27/89  soft-tissue]
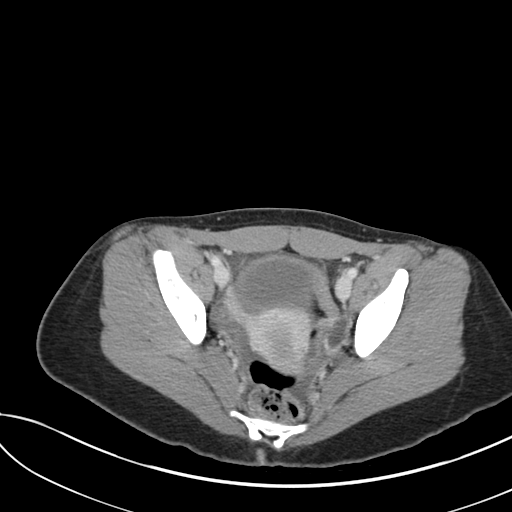
[im 36/89  soft-tissue]
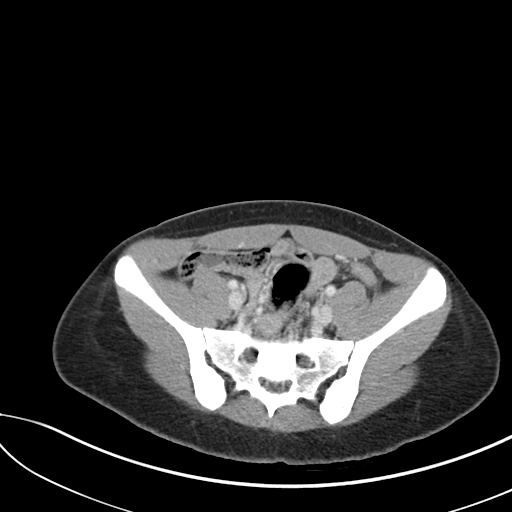
[im 40/89  soft-tissue]
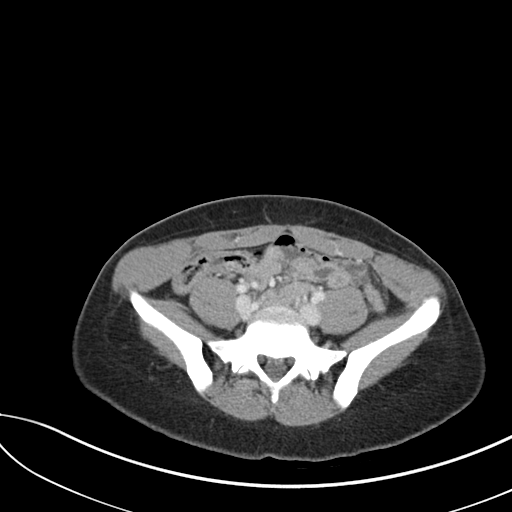
[im 49/89  soft-tissue]
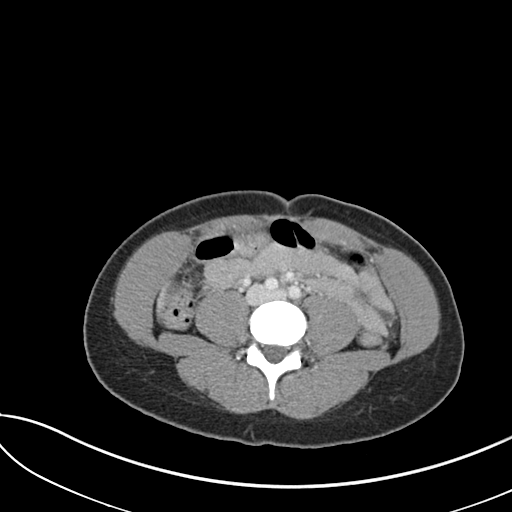
[im 53/89  soft-tissue]
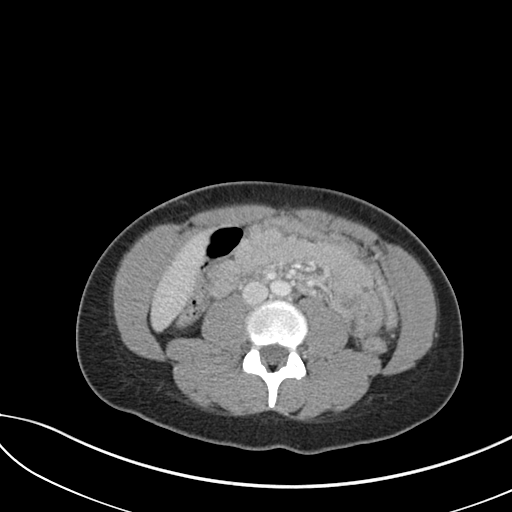
[im 62/89  soft-tissue]
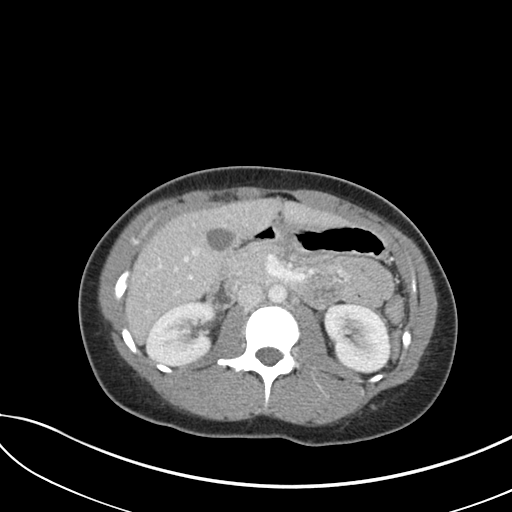
[im 62/89  bone]
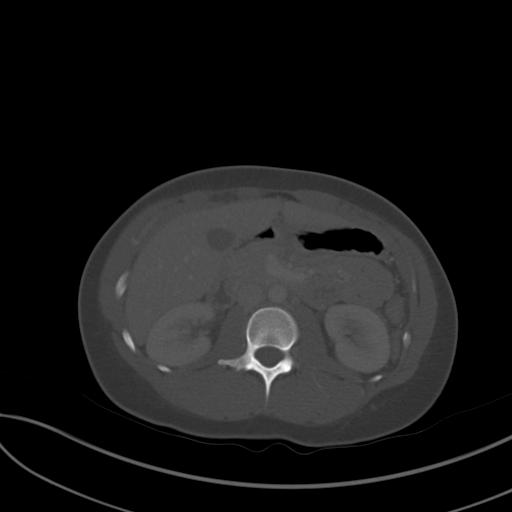
[im 71/89  soft-tissue]
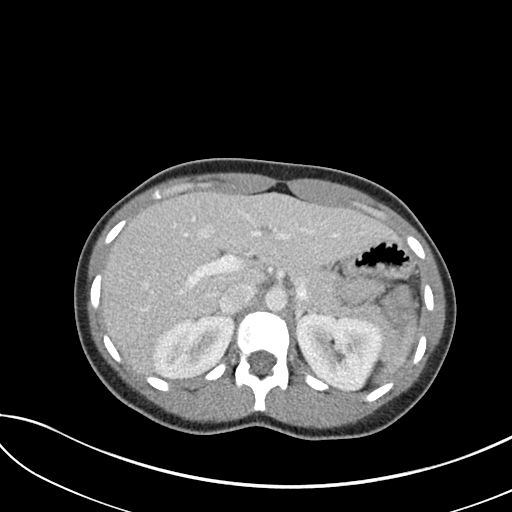
[im 75/89  soft-tissue]
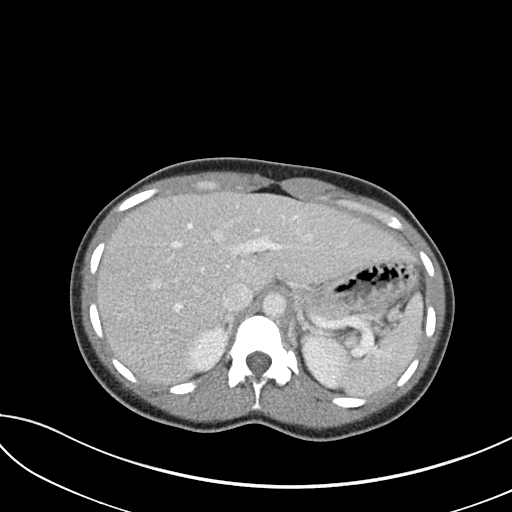
[im 84/89  soft-tissue]
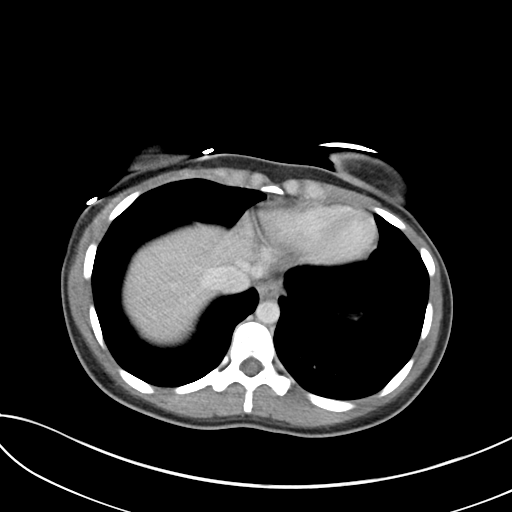

[Series 5: coronal st · coronal · 0.65mm/px · 3 of 116 slices shown]
[im 39/116  soft-tissue]
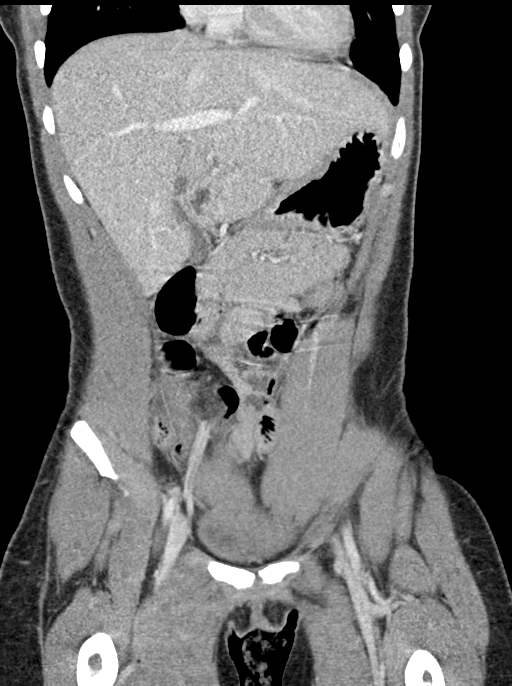
[im 52/116  soft-tissue]
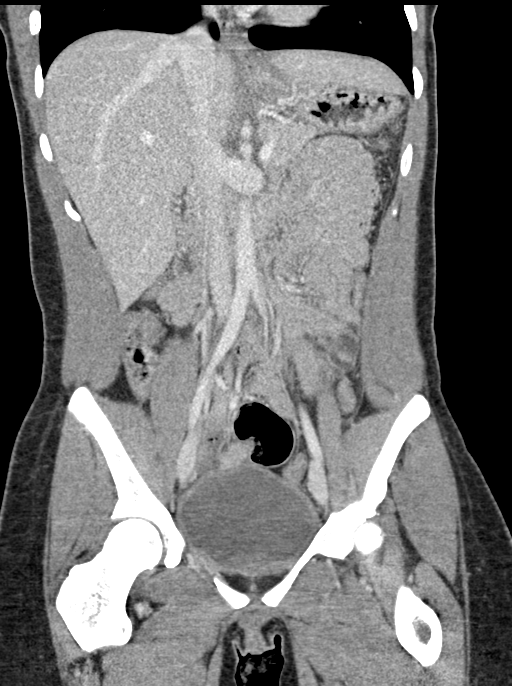
[im 64/116  soft-tissue]
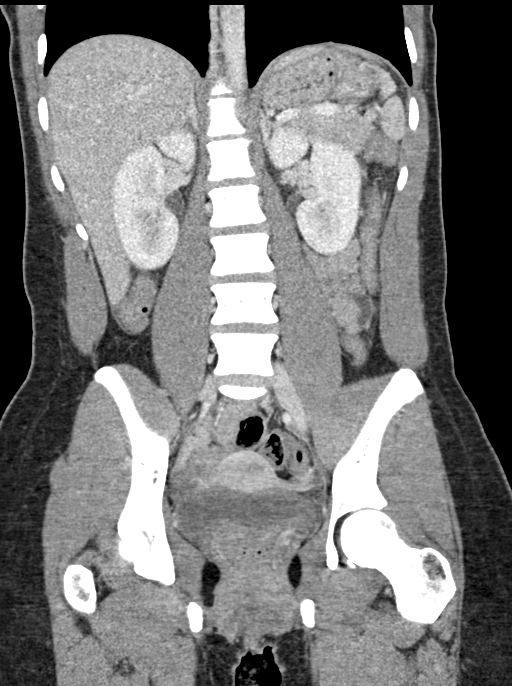

[15 of 46 positions shown; findings below may reference images not displayed]

RADIATION DOSE REDUCTION: This exam was performed according to the
departmental dose-optimization program which includes automated
exposure control, adjustment of the mA and/or kV according to
patient size and/or use of iterative reconstruction technique.

CONTRAST:  100mL OMNIPAQUE IOHEXOL 300 MG/ML  SOLN
FINDINGS: Lower chest: Unremarkable.

Hepatobiliary: No suspicious focal abnormality within the liver
parenchyma. There is no evidence for gallstones, gallbladder wall
thickening, or pericholecystic fluid. No intrahepatic or
extrahepatic biliary dilation.

Pancreas: No focal mass lesion. No dilatation of the main duct. No
intraparenchymal cyst. No peripancreatic edema.

Spleen: No splenomegaly. No focal mass lesion.

Adrenals/Urinary Tract: No adrenal nodule or mass. Kidneys
unremarkable. No evidence for hydroureter. The urinary bladder
appears normal for the degree of distention.

Stomach/Bowel: Stomach is unremarkable. No gastric wall thickening.
No evidence of outlet obstruction. Duodenum is normally positioned
as is the ligament of Treitz. No small bowel wall thickening. No
small bowel dilatation. Terminal ileum 1 not well seen. The appendix
is not well visualized, but there is no edema or inflammation in the
region of the cecum. Is probably visible near the midline
posteriorly on sagittal images 81 and 82 of series 6. There is no
edema or inflammation in the region of the cecal tip. No gross
colonic mass. No colonic wall thickening.

Vascular/Lymphatic: No abdominal aortic aneurysm. No abdominal
aortic atherosclerotic calcification. There is no gastrohepatic or
hepatoduodenal ligament lymphadenopathy. No retroperitoneal or
mesenteric lymphadenopathy. No pelvic sidewall lymphadenopathy.

Reproductive: The uterus is unremarkable.  There is no adnexal mass.

Other: Trace free fluid noted in the cul-de-sac.

Musculoskeletal: No worrisome lytic or sclerotic osseous
abnormality.
IMPRESSION: No acute findings in the abdomen or pelvis. Specifically, no
findings to explain the patient's history of abdominal pain with
nausea and vomiting.

## 2023-10-28 ENCOUNTER — Encounter (HOSPITAL_COMMUNITY): Payer: Self-pay

## 2023-10-28 ENCOUNTER — Other Ambulatory Visit: Payer: Self-pay

## 2023-10-28 ENCOUNTER — Observation Stay (HOSPITAL_COMMUNITY)
Admission: EM | Admit: 2023-10-28 | Discharge: 2023-10-29 | Disposition: A | Discharge status: Home / Self Care | Payer: Medicaid Other | Attending: Family Medicine | Admitting: Family Medicine

## 2023-10-28 DIAGNOSIS — R112 Nausea with vomiting, unspecified: Secondary | ICD-10-CM | POA: Diagnosis present

## 2023-10-28 DIAGNOSIS — D75839 Thrombocytosis, unspecified: Secondary | ICD-10-CM | POA: Insufficient documentation

## 2023-10-28 DIAGNOSIS — Z79899 Other long term (current) drug therapy: Secondary | ICD-10-CM | POA: Insufficient documentation

## 2023-10-28 DIAGNOSIS — E872 Acidosis, unspecified: Secondary | ICD-10-CM | POA: Diagnosis not present

## 2023-10-28 DIAGNOSIS — G40309 Generalized idiopathic epilepsy and epileptic syndromes, not intractable, without status epilepticus: Secondary | ICD-10-CM

## 2023-10-28 DIAGNOSIS — R7309 Other abnormal glucose: Secondary | ICD-10-CM | POA: Insufficient documentation

## 2023-10-28 DIAGNOSIS — F410 Panic disorder [episodic paroxysmal anxiety] without agoraphobia: Secondary | ICD-10-CM | POA: Diagnosis present

## 2023-10-28 DIAGNOSIS — E876 Hypokalemia: Secondary | ICD-10-CM | POA: Diagnosis not present

## 2023-10-28 DIAGNOSIS — G40909 Epilepsy, unspecified, not intractable, without status epilepticus: Secondary | ICD-10-CM

## 2023-10-28 DIAGNOSIS — R739 Hyperglycemia, unspecified: Secondary | ICD-10-CM

## 2023-10-28 LAB — COMPREHENSIVE METABOLIC PANEL
ALT: 30 U/L (ref 0–44)
AST: 34 U/L (ref 15–41)
Albumin: 4.9 g/dL (ref 3.5–5.0)
Alkaline Phosphatase: 53 U/L (ref 38–126)
Anion gap: 20 — ABNORMAL HIGH (ref 5–15)
BUN: 10 mg/dL (ref 6–20)
CO2: 15 mmol/L — ABNORMAL LOW (ref 22–32)
Calcium: 10 mg/dL (ref 8.9–10.3)
Chloride: 103 mmol/L (ref 98–111)
Creatinine, Ser: 0.63 mg/dL (ref 0.44–1.00)
GFR, Estimated: >60 mL/min (ref >60)
Glucose, Bld: 164 mg/dL — ABNORMAL HIGH (ref 70–99)
Potassium: 3.1 mmol/L — ABNORMAL LOW (ref 3.5–5.1)
Sodium: 138 mmol/L (ref 135–145)
Total Bilirubin: 0.7 mg/dL (ref <1.2)
Total Protein: 8.2 g/dL — ABNORMAL HIGH (ref 6.5–8.1)

## 2023-10-28 LAB — RAPID URINE DRUG SCREEN, HOSP PERFORMED
Amphetamines: NOT DETECTED
Barbiturates: NOT DETECTED
Benzodiazepines: NOT DETECTED
Cocaine: NOT DETECTED
Opiates: NOT DETECTED
Tetrahydrocannabinol: POSITIVE — AB

## 2023-10-28 LAB — URINALYSIS, ROUTINE W REFLEX MICROSCOPIC
Bilirubin Urine: NEGATIVE
Glucose, UA: NEGATIVE mg/dL
Hgb urine dipstick: NEGATIVE
Ketones, ur: 20 mg/dL — AB
Leukocytes,Ua: NEGATIVE
Nitrite: NEGATIVE
Protein, ur: NEGATIVE mg/dL
Specific Gravity, Urine: 1.018 (ref 1.005–1.030)
pH: 6 (ref 5.0–8.0)

## 2023-10-28 LAB — CBC WITH DIFFERENTIAL/PLATELET
Abs Immature Granulocytes: 0.11 10*3/uL — ABNORMAL HIGH (ref 0.00–0.07)
Basophils Absolute: 0 10*3/uL (ref 0.0–0.1)
Basophils Relative: 0 %
Eosinophils Absolute: 0.1 10*3/uL (ref 0.0–0.5)
Eosinophils Relative: 0 %
HCT: 41 % (ref 36.0–46.0)
Hemoglobin: 14.2 g/dL (ref 12.0–15.0)
Immature Granulocytes: 1 %
Lymphocytes Relative: 25 %
Lymphs Abs: 3.5 10*3/uL (ref 0.7–4.0)
MCH: 32.6 pg (ref 26.0–34.0)
MCHC: 34.6 g/dL (ref 30.0–36.0)
MCV: 94.3 fL (ref 80.0–100.0)
Monocytes Absolute: 0.8 10*3/uL (ref 0.1–1.0)
Monocytes Relative: 6 %
Neutro Abs: 9.6 10*3/uL — ABNORMAL HIGH (ref 1.7–7.7)
Neutrophils Relative %: 68 %
Platelets: 427 10*3/uL — ABNORMAL HIGH (ref 150–400)
RBC: 4.35 MIL/uL (ref 3.87–5.11)
RDW: 12.7 % (ref 11.5–15.5)
WBC: 14.1 10*3/uL — ABNORMAL HIGH (ref 4.0–10.5)
nRBC: 0 % (ref 0.0–0.2)

## 2023-10-28 LAB — LIPASE, BLOOD: Lipase: 30 U/L (ref 11–51)

## 2023-10-28 LAB — MAGNESIUM: Magnesium: 1.9 mg/dL (ref 1.7–2.4)

## 2023-10-28 LAB — HCG, SERUM, QUALITATIVE: Preg, Serum: NEGATIVE

## 2023-10-28 LAB — PHOSPHORUS: Phosphorus: 2.1 mg/dL — ABNORMAL LOW (ref 2.5–4.6)

## 2023-10-28 MED ORDER — SODIUM CHLORIDE 0.9 % IV BOLUS
1000.0000 mL | Freq: Once | INTRAVENOUS | Status: AC
  Administered 2023-10-28: 1000 mL via INTRAVENOUS

## 2023-10-28 MED ORDER — PANTOPRAZOLE SODIUM 40 MG IV SOLR
40.0000 mg | INTRAVENOUS | Status: DC
  Administered 2023-10-28: 40 mg via INTRAVENOUS
  Filled 2023-10-28: qty 10

## 2023-10-28 MED ORDER — LEVETIRACETAM IN NACL 1500 MG/100ML IV SOLN
1500.0000 mg | Freq: Once | INTRAVENOUS | Status: AC
  Administered 2023-10-28: 1500 mg via INTRAVENOUS
  Filled 2023-10-28: qty 100

## 2023-10-28 MED ORDER — ONDANSETRON HCL 4 MG/2ML IJ SOLN
4.0000 mg | Freq: Once | INTRAMUSCULAR | Status: AC
  Administered 2023-10-28: 4 mg via INTRAVENOUS
  Filled 2023-10-28: qty 2

## 2023-10-28 MED ORDER — ACETAMINOPHEN 325 MG PO TABS: 650.0000 mg | ORAL_TABLET | Freq: Four times a day (QID) | ORAL | Status: DC | PRN

## 2023-10-28 MED ORDER — HYDROMORPHONE HCL 1 MG/ML IJ SOLN: 0.5000 mg | INTRAMUSCULAR | Status: DC | PRN

## 2023-10-28 MED ORDER — ONDANSETRON HCL 4 MG/2ML IJ SOLN
4.0000 mg | Freq: Four times a day (QID) | INTRAMUSCULAR | Status: DC | PRN
  Administered 2023-10-28: 4 mg via INTRAVENOUS
  Filled 2023-10-28: qty 2

## 2023-10-28 MED ORDER — POTASSIUM CHLORIDE IN NACL 20-0.9 MEQ/L-% IV SOLN
INTRAVENOUS | Status: DC
  Filled 2023-10-28 (×2): qty 1000

## 2023-10-28 MED ORDER — HYDROMORPHONE HCL 1 MG/ML IJ SOLN
0.7500 mg | Freq: Once | INTRAMUSCULAR | Status: AC
  Administered 2023-10-28: 0.75 mg via INTRAVENOUS
  Filled 2023-10-28: qty 1

## 2023-10-28 MED ORDER — MAGNESIUM SULFATE 2 GM/50ML IV SOLN
2.0000 g | Freq: Once | INTRAVENOUS | Status: AC
  Administered 2023-10-28: 2 g via INTRAVENOUS
  Filled 2023-10-28: qty 50

## 2023-10-28 MED ORDER — ACETAMINOPHEN 650 MG RE SUPP: 650.0000 mg | Freq: Four times a day (QID) | RECTAL | Status: DC | PRN

## 2023-10-28 MED ORDER — ONDANSETRON HCL 4 MG PO TABS: 4.0000 mg | ORAL_TABLET | Freq: Four times a day (QID) | ORAL | Status: DC | PRN

## 2023-10-28 MED ORDER — SODIUM CHLORIDE 0.9 % IV SOLN
25.0000 mg | Freq: Four times a day (QID) | INTRAVENOUS | Status: DC | PRN
  Administered 2023-10-28: 25 mg via INTRAVENOUS
  Filled 2023-10-28: qty 1

## 2023-10-28 MED ORDER — LEVETIRACETAM IN NACL 1500 MG/100ML IV SOLN
1500.0000 mg | Freq: Two times a day (BID) | INTRAVENOUS | Status: DC
  Administered 2023-10-28 – 2023-10-29 (×2): 1500 mg via INTRAVENOUS
  Filled 2023-10-28 (×3): qty 100

## 2023-10-28 MED ORDER — LORAZEPAM 2 MG/ML IJ SOLN
1.0000 mg | Freq: Every evening | INTRAMUSCULAR | Status: AC | PRN
  Administered 2023-10-29: 1 mg via INTRAVENOUS
  Filled 2023-10-28: qty 1

## 2023-10-28 MED ORDER — METOCLOPRAMIDE HCL 5 MG/ML IJ SOLN
5.0000 mg | Freq: Four times a day (QID) | INTRAMUSCULAR | Status: DC
  Administered 2023-10-28 – 2023-10-29 (×4): 5 mg via INTRAVENOUS
  Filled 2023-10-28 (×4): qty 2

## 2023-10-28 MED ORDER — POTASSIUM PHOSPHATES 15 MMOLE/5ML IV SOLN
15.0000 mmol | Freq: Once | INTRAVENOUS | Status: AC
  Administered 2023-10-28: 15 mmol via INTRAVENOUS
  Filled 2023-10-28: qty 5

## 2023-10-28 NOTE — ED Provider Notes (Signed)
Patient with persistent vomiting.  Most likely related to hyperemesis from cannabis use.  She will be admitted to medicine   Bethann Berkshire, MD 10/28/23 [phone]

## 2023-10-28 NOTE — H&P (Signed)
History and Physical    PatientMarland Calderon CHRYSTYNA PEARY Calderon:[id number] DOB: Jan 16, 2001 DOA: 10/28/2023 DOS: the patient was seen and examined on 10/28/2023 PCP: Christel Mormon, MD  Patient coming from: Home  Chief Complaint:  Chief Complaint  Patient presents with   Emesis   Abdominal Pain   HPI: Melinda Calderon is a 22 y.o. female with medical history significant of anxiety, GERD, seizure disorder who presented to the emergency department with complaints of abdominal pain, nausea and emesis.  No travel history or sick contacts.  No recent use of cannabis.  No fever, chills or night sweats. No sore throat, rhinorrhea, dyspnea, wheezing or hemoptysis.  No chest pain, palpitations, diaphoresis, PND, orthopnea or pitting edema of the lower extremities.  No constipation, melena or hematochezia.  No flank pain, dysuria, frequency or hematuria.  No polyuria, polydipsia, polyphagia or blurred vision.  Lab work: CBC showed a white count of 14.1, hemoglobin 14.2 g/dL platelets 811.  Urine pregnancy test negative.  Lipase is normal.  Magnesium is 1.9 and phosphorus 2.1 mg/dL.  Potassium is 3.1 and CO2 15 mmol/L with an anion gap of 20 glucose 164 mg/dL, normal sodium, chloride, calcium and renal function.  LFTs were unremarkable, except for a total protein of 8.2 g/dL.   ED course: Initial vital signs were temperature 97.9 F, pulse 85, respiration 18, BP 127/90 mmHg O2 sat 100% on room air.  Patient received 1000 mL normal saline bolus, ondansetron 4 mg IVP and 1500 mg of Keppra.  Review of Systems: As mentioned in the history of present illness. All other systems reviewed and are negative. Past Medical History:  Diagnosis Date   Anxiety    GERD (gastroesophageal reflux disease)    Seizures (HCC)    History reviewed. No pertinent surgical history. Social History:  reports that she has never smoked. She has never used smokeless tobacco. She reports current alcohol use. She reports that she does  not use drugs.  No Known Allergies  Family History  Problem Relation Age of Onset   Hypertension Mother    Cancer Maternal Grandmother        Died at 69   Cancer Paternal Grandmother        Died at 100   Epilepsy Maternal Grandfather    Cirrhosis Maternal Grandfather        Died at 22   Seizures Paternal Grandfather        Died at 55   Epilepsy Paternal Aunt     Prior to Admission medications   Medication Sig Start Date End Date Taking? Authorizing Provider  chlorhexidine (PERIDEX) 0.12 % solution Use as directed 15 mLs in the mouth or throat 2 (two) times daily. Swish and spit Patient not taking: Reported on 01/12/2022 04/09/21   Lew Dawes, PA-C  FYCOMPA 4 MG TABS Take 1 tablet at bedtime 11/26/22   Van Clines, MD  ibuprofen (ADVIL) 800 MG tablet Take 1 tablet (800 mg total) by mouth 3 (three) times daily. Patient not taking: Reported on 05/26/2023 04/09/21   Wieters, Fran Lowes C, PA-C  lamoTRIgine (LAMICTAL) 100 MG tablet Take 2 tablets by mouth twice per day Patient taking differently: Take 300 mg by mouth 2 (two) times daily. Take 2 tablets by mouth twice per day 11/26/22   Van Clines, MD  levETIRAcetam (KEPPRA) 500 MG tablet Take  3 tabs in AM, 3 tabs in PM 11/26/22   Van Clines, MD  omeprazole (PRILOSEC) 20 MG capsule Take  1 capsule (20 mg total) by mouth daily. Patient not taking: Reported on 05/26/2023 12/31/20   Roxy Horseman, PA-C  ondansetron (ZOFRAN ODT) 4 MG disintegrating tablet Take 1 tablet (4 mg total) by mouth every 8 (eight) hours as needed for nausea or vomiting. Patient not taking: Reported on 05/26/2023 12/31/20   Roxy Horseman, PA-C  potassium chloride (KLOR-CON) 10 MEQ tablet Take 1 tablet (10 mEq total) by mouth 2 (two) times daily for 5 days. 05/25/21 11/26/22  Farrel Gordon, PA-C  promethazine (PHENERGAN) 25 MG suppository Place 1 suppository (25 mg total) rectally every 6 (six) hours as needed for up to 5 doses for nausea or  vomiting. Patient not taking: Reported on 05/26/2023 01/12/22   Cheryll Cockayne, MD  sertraline (ZOLOFT) 50 MG tablet Take 1 tablet each day Patient not taking: Reported on 05/26/2023 09/03/20   Van Clines, MD  sucralfate (CARAFATE) 1 g tablet Take 1 tablet (1 g total) by mouth 4 (four) times daily -  with meals and at bedtime. Patient not taking: Reported on 05/26/2023 12/31/20   Roxy Horseman, PA-C  dicyclomine (BENTYL) 20 MG tablet Take 1 tablet (20 mg total) by mouth 2 (two) times daily. 06/28/20 12/31/20  Joy, Shawn C, PA-C  famotidine (PEPCID) 20 MG tablet Take 1 tablet (20 mg total) by mouth 2 (two) times daily. 10/07/20 12/31/20  Gerhard Munch, MD    Physical Exam: Vitals:   10/28/23 0516 10/28/23 0518 10/28/23 0600 10/28/23 0700  BP:  (!) 118/98  (!) 145/83  Pulse:  78  89  Resp:  20  18  Temp: 97.9 F (36.6 C)  97.7 F (36.5 C)   TempSrc: Oral  Oral   SpO2:  100% 100% 100%   Physical Exam Vitals and nursing note reviewed.  Constitutional:      General: She is awake. She is not in acute distress.    Appearance: She is well-developed. She is ill-appearing.  HENT:     Head: Normocephalic.     Nose: No rhinorrhea.     Mouth/Throat:     Mouth: Mucous membranes are dry.  Eyes:     General: No scleral icterus.    Pupils: Pupils are equal, round, and reactive to light.  Neck:     Vascular: No JVD.  Cardiovascular:     Rate and Rhythm: Normal rate and regular rhythm.     Heart sounds: S1 normal and S2 normal.  Pulmonary:     Effort: Pulmonary effort is normal.     Breath sounds: Normal breath sounds. No wheezing, rhonchi or rales.  Abdominal:     General: Bowel sounds are normal. There is no distension.     Palpations: Abdomen is rigid.     Tenderness: There is abdominal tenderness. There is no right CVA tenderness, left CVA tenderness, guarding or rebound.  Musculoskeletal:     Cervical back: Neck supple.     Right lower leg: No edema.     Left lower leg: No edema.   Skin:    General: Skin is warm and dry.  Neurological:     General: No focal deficit present.     Mental Status: She is alert and oriented to person, place, and time.  Psychiatric:        Mood and Affect: Mood normal.        Behavior: Behavior normal. Behavior is cooperative.     Data Reviewed:  Results are pending, will review when available.  Assessment and Plan:  Principal Problem:   Intractable nausea and vomiting Observation/telemetry. Continue IV fluids. Keep n.p.o. for now. Analgesics as needed. Antiemetics as needed. Pantoprazole 40 mg IVP daily. Follow CBC, CMP in AM.  Active Problems:   Seizure disorder (HCC) Continue levetiracetam 1500 mg IVPB every 12 hours. Avoid sleep deprivation as possible. Resume lamotrigine once cleared for oral intake. Received magnesium sulfate for of hypokalemia.    Panic disorder No longer on sertraline.    Hypophosphatemia Replacing. Follow level as needed.    Hypokalemia Replacing.  Mother Waldron Labs sulfate supplementation. Will follow level in the morning.    Advance Care Planning:   Code Status: Full Code   Consults:   Family Communication:   Severity of Illness: The appropriate patient status for this patient is OBSERVATION. Observation status is judged to be reasonable and necessary in order to provide the required intensity of service to ensure the patient's safety. The patient's presenting symptoms, physical exam findings, and initial radiographic and laboratory data in the context of their medical condition is felt to place them at decreased risk for further clinical deterioration. Furthermore, it is anticipated that the patient will be medically stable for discharge from the hospital within 2 midnights of admission.   Author: Bobette Mo, MD 10/28/2023 9:02 AM  For on call review [email or web address].   This document was prepared using Dragon voice recognition software and may contain some unintended  transcription errors.

## 2023-10-28 NOTE — Plan of Care (Signed)
Plan of Care reviewed. 

## 2023-10-28 NOTE — ED Notes (Signed)
ED TO INPATIENT HANDOFF REPORT  ED Nurse Name and Phone #:  Mellody Dance  -  [phone]   S Name/Age/Gender Melinda Calderon 22 y.o. female Room/Bed: WA21/WA21  Code Status   Code Status: Prior  Home/SNF/Other Home Patient oriented to: self, place, time, and situation Is this baseline? Yes   Triage Complete: Triage complete  Chief Complaint Intractable nausea and vomiting [R11.2]  Triage Note Pt came from home via EMS with c/o of vomiting x 2 days. Continuous vomiting episodes while on EMS. Last episode had bright red blood. Pt states this has happened before and it was food poisoning. Hx of seizures. takes keppra and Lamictal  EMS CBG 449 4 mg of Zofran    Allergies No Known Allergies  Level of Care/Admitting Diagnosis ED Disposition     ED Disposition  Admit   Condition  --   Comment  Hospital Area: [facility] [[id number]]  Level of Care: Telemetry [5]  Admit to tele based on following criteria: Other see comments  Comments: Hypokalemia  May place patient in observation at [facility] or [facility] if equivalent level of care is available:: No  Covid Evaluation: Asymptomatic - no recent exposure (last 10 days) testing not required  Diagnosis: Intractable nausea and vomiting [720114]  Admitting Physician: Bobette Mo [[id number]]  Attending Physician: Bobette Mo [[id number]]          B Medical/Surgery History Past Medical History:  Diagnosis Date   Anxiety    GERD (gastroesophageal reflux disease)    Seizures (HCC)    History reviewed. No pertinent surgical history.   A IV Location/Drains/Wounds Patient Lines/Drains/Airways Status     Active Line/Drains/Airways     Name Placement date Placement time Site Days   Peripheral IV 10/28/23 20 G 1" Right Antecubital 10/28/23  0647  Antecubital  less than 1            Intake/Output Last 24 hours  Intake/Output Summary (Last 24 hours) at 10/28/2023 0858 Last data filed  at 10/28/2023 0748 Gross per 24 hour  Intake 132.86 ml  Output --  Net 132.86 ml    Labs/Imaging Results for orders placed or performed during the hospital encounter of 10/28/23 (from the past 48 hour(s))  Comprehensive metabolic panel     Status: Abnormal   Collection Time: 10/28/23  5:35 AM  Result Value Ref Range   Sodium 138 135 - 145 mmol/L   Potassium 3.1 (L) 3.5 - 5.1 mmol/L   Chloride 103 98 - 111 mmol/L   CO2 15 (L) 22 - 32 mmol/L   Glucose, Bld 164 (H) 70 - 99 mg/dL    Comment: Glucose reference range applies only to samples taken after fasting for at least 8 hours.   BUN 10 6 - 20 mg/dL   Creatinine, Ser 9.56 0.44 - 1.00 mg/dL   Calcium 21.3 8.9 - 08.6 mg/dL   Total Protein 8.2 (H) 6.5 - 8.1 g/dL   Albumin 4.9 3.5 - 5.0 g/dL   AST 34 15 - 41 U/L   ALT 30 0 - 44 U/L   Alkaline Phosphatase 53 38 - 126 U/L   Total Bilirubin 0.7 <1.2 mg/dL   GFR, Estimated >57 >84 mL/min    Comment: (NOTE) Calculated using the CKD-EPI Creatinine Equation (2021)    Anion gap 20 (H) 5 - 15    Comment: Performed at [facility], [address], [place], [place] [address]  Lipase, blood  Status: None   Collection Time: 10/28/23  5:35 AM  Result Value Ref Range   Lipase 30 11 - 51 U/L    Comment: Performed at [facility], [address], [place], [place] [address]  CBC with Differential     Status: Abnormal   Collection Time: 10/28/23  5:35 AM  Result Value Ref Range   WBC 14.1 (H) 4.0 - 10.5 K/uL   RBC 4.35 3.87 - 5.11 MIL/uL   Hemoglobin 14.2 12.0 - 15.0 g/dL   HCT 60.4 54.0 - 98.1 %   MCV 94.3 80.0 - 100.0 fL   MCH 32.6 26.0 - 34.0 pg   MCHC 34.6 30.0 - 36.0 g/dL   RDW 19.1 47.8 - 29.5 %   Platelets 427 (H) 150 - 400 K/uL    Comment: REPEATED TO VERIFY   nRBC 0.0 0.0 - 0.2 %   Neutrophils Relative % 68 %   Neutro Abs 9.6 (H) 1.7 - 7.7 K/uL   Lymphocytes Relative 25 %   Lymphs Abs 3.5 0.7 - 4.0 K/uL   Monocytes Relative 6 %    Monocytes Absolute 0.8 0.1 - 1.0 K/uL   Eosinophils Relative 0 %   Eosinophils Absolute 0.1 0.0 - 0.5 K/uL   Basophils Relative 0 %   Basophils Absolute 0.0 0.0 - 0.1 K/uL   Immature Granulocytes 1 %   Abs Immature Granulocytes 0.11 (H) 0.00 - 0.07 K/uL    Comment: Performed at [facility], [address], [place], [place] [address]  hCG, serum, qualitative     Status: None   Collection Time: 10/28/23  5:45 AM  Result Value Ref Range   Preg, Serum NEGATIVE NEGATIVE    Comment:        THE SENSITIVITY OF THIS METHODOLOGY IS >10 mIU/mL. Performed at [facility], [address], [place], [place] [address]    No results found.  Pending Labs Unresulted Labs (From admission, onward)     Start     Ordered   10/28/23 0854  Phosphorus  Add-on,   AD        10/28/23 0853   10/28/23 0853  Magnesium  Add-on,   AD        10/28/23 0853   10/28/23 0520  Urinalysis, Routine w reflex microscopic -Urine, Clean Catch  Once,   URGENT       Question:  Specimen Source  Answer:  Urine, Clean Catch   10/28/23 0520   10/28/23 0520  Urine rapid drug screen (hosp performed)  Once,   STAT        10/28/23 0520            Vitals/Pain Today's Vitals   10/28/23 0518 10/28/23 0519 10/28/23 0600 10/28/23 0700  BP: (!) 118/98   (!) 145/83  Pulse: 78   89  Resp: 20   18  Temp:   97.7 F (36.5 C)   TempSrc:   Oral   SpO2: 100%  100% 100%  PainSc:  10-Worst pain ever      Isolation Precautions No active isolations  Medications Medications  promethazine (PHENERGAN) 25 mg in sodium chloride 0.9 % 50 mL IVPB (0 mg Intravenous Stopped 10/28/23 0724)  ondansetron (ZOFRAN) injection 4 mg (4 mg Intravenous Given 10/28/23 0530)  sodium chloride 0.9 % bolus 1,000 mL (1,000 mLs Intravenous Bolus 10/28/23 0530)  levETIRAcetam (KEPPRA) IVPB 1500 mg/ 100 mL premix (0 mg Intravenous Stopped 10/28/23 0748)    Mobility  walks     Focused  Assessments    R Recommendations: See Admitting Provider Note  Report given to:   Additional Notes:

## 2023-10-28 NOTE — ED Triage Notes (Signed)
Pt came from home via EMS with c/o of vomiting x 2 days. Continuous vomiting episodes while on EMS. Last episode had bright red blood. Pt states this has happened before and it was food poisoning. Hx of seizures. takes keppra and Lamictal  EMS CBG 449 4 mg of Zofran

## 2023-10-28 NOTE — ED Provider Notes (Signed)
[place] EMERGENCY DEPARTMENT AT [facility] Provider Note   CSN: [id number] Arrival date & time: 10/28/23  0503     History  Chief complaint: Vomiting  Melinda Calderon is a 22 y.o. female.  The history is provided by the patient.  She has history of seizures, anxiety, GERD and comes in complaining of nausea and vomiting for the last 2 days.  She has had subjective fever as well as chills and sweats.  She has had mild diarrhea.  She is also complaining of some body aching.  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  She has taken acetaminophen without relief.  Denies any sick contacts.   Home Medications Prior to Admission medications   Medication Sig Start Date End Date Taking? Authorizing Provider  chlorhexidine (PERIDEX) 0.12 % solution Use as directed 15 mLs in the mouth or throat 2 (two) times daily. Swish and spit Patient not taking: Reported on 01/12/2022 04/09/21   Lew Dawes, PA-C  FYCOMPA 4 MG TABS Take 1 tablet at bedtime 11/26/22   Van Clines, MD  ibuprofen (ADVIL) 800 MG tablet Take 1 tablet (800 mg total) by mouth 3 (three) times daily. Patient not taking: Reported on 05/26/2023 04/09/21   Wieters, Fran Lowes C, PA-C  lamoTRIgine (LAMICTAL) 100 MG tablet Take 2 tablets by mouth twice per day Patient taking differently: Take 300 mg by mouth 2 (two) times daily. Take 2 tablets by mouth twice per day 11/26/22   Van Clines, MD  levETIRAcetam (KEPPRA) 500 MG tablet Take  3 tabs in AM, 3 tabs in PM 11/26/22   Van Clines, MD  omeprazole (PRILOSEC) 20 MG capsule Take 1 capsule (20 mg total) by mouth daily. Patient not taking: Reported on 05/26/2023 12/31/20   Roxy Horseman, PA-C  ondansetron (ZOFRAN ODT) 4 MG disintegrating tablet Take 1 tablet (4 mg total) by mouth every 8 (eight) hours as needed for nausea or vomiting. Patient not taking: Reported on 05/26/2023 12/31/20   Roxy Horseman, PA-C  potassium chloride (KLOR-CON) 10 MEQ tablet Take 1 tablet (10 mEq total) by mouth 2 (two) times daily for 5 days. 05/25/21 11/26/22  Farrel Gordon, PA-C  promethazine (PHENERGAN) 25 MG suppository Place 1 suppository (25 mg total) rectally every 6 (six) hours as needed for up to 5 doses for nausea or vomiting. Patient not taking: Reported on 05/26/2023 01/12/22   Cheryll Cockayne, MD  sertraline (ZOLOFT) 50 MG tablet Take 1 tablet each day Patient not taking: Reported on 05/26/2023 09/03/20   Van Clines, MD  sucralfate (CARAFATE) 1 g tablet Take 1 tablet (1 g total) by mouth 4 (four) times daily -  with meals and at bedtime. Patient not taking: Reported on 05/26/2023 12/31/20   Roxy Horseman, PA-C  dicyclomine (BENTYL) 20 MG tablet Take 1 tablet (20 mg total) by mouth 2 (two) times daily. 06/28/20 12/31/20  Joy, Shawn C, PA-C  famotidine (PEPCID) 20 MG tablet Take 1 tablet (20 mg total) by mouth 2 (two) times daily. 10/07/20 12/31/20  Gerhard Munch, MD      Allergies    Patient has no known allergies.    Review of Systems   Review of Systems  All other systems reviewed and are negative.   Physical Exam Updated Vital Signs BP (!) 118/98 (BP Location: Left Arm)   Pulse 78   Temp 97.9 F (36.6 C) (Oral)   Resp 20  SpO2 100%  Physical Exam Vitals and nursing note reviewed.   22 year old female, resting comfortably and in no acute distress. Vital signs are significant for elevated diastolic blood pressure. Oxygen saturation is 100%, which is normal. Head is normocephalic and atraumatic. PERRLA, EOMI. Oropharynx is clear. Lungs are clear without rales, wheezes, or rhonchi. Chest is nontender. Heart has regular rate and rhythm without murmur. Abdomen is soft, flat, nontender.  Peristalsis is hypoactive. Skin is warm and dry without rash. Neurologic: Mental status is normal, moves all extremities equally.  ED Results / Procedures / Treatments   Labs (all labs ordered  are listed, but only abnormal results are displayed) Labs Reviewed  COMPREHENSIVE METABOLIC PANEL - Abnormal; Notable for the following components:      Result Value   Potassium 3.1 (*)    CO2 15 (*)    Glucose, Bld 164 (*)    Total Protein 8.2 (*)    Anion gap 20 (*)    All other components within normal limits  CBC WITH DIFFERENTIAL/PLATELET - Abnormal; Notable for the following components:   WBC 14.1 (*)    Platelets 427 (*)    Neutro Abs 9.6 (*)    Abs Immature Granulocytes 0.11 (*)    All other components within normal limits  LIPASE, BLOOD  HCG, SERUM, QUALITATIVE  URINALYSIS, ROUTINE W REFLEX MICROSCOPIC  RAPID URINE DRUG SCREEN, HOSP PERFORMED   Procedures Procedures    Medications Ordered in ED Medications  ondansetron (ZOFRAN) injection 4 mg (has no administration in time range)  sodium chloride 0.9 % bolus 1,000 mL (has no administration in time range)    ED Course/ Medical Decision Making/ A&P                                 Medical Decision Making Amount and/or Complexity of Data Reviewed Labs: ordered.  Risk Prescription drug management.   Nausea and vomiting with diarrhea and pattern most viral gastroenteritis.  Consider cannabis hyperemesis syndrome.  No evidence of bowel obstruction, diverticulitis, or other serious intra-abdominal pathology.  I have reviewed her past records, and on 01/12/2022 she was seen in the emergency department for cannabis hyperemesis syndrome.  She also had multiple other ED visits for nausea and vomiting.  I have ordered IV fluids, ondansetron for nausea.  Have ordered screening labs of CBC, comprehensive metabolic panel, lipase, urinalysis and I have also ordered a urine drug screen.  She had no improvement with ondansetron, I ordered a dose of promethazine. I reviewed her laboratory results, and my interpretation is hypokalemia, metabolic acidosis likely secondary to vomiting, elevated random glucose, thrombocytosis. Case is  signed out to Dr. Estell Harpin.  Final Clinical Impression(s) / ED Diagnoses Final diagnoses:  Nausea and vomiting, unspecified vomiting type  Metabolic acidosis  Hypokalemia  Elevated random blood glucose level  Thrombocytosis    Rx / DC Orders ED Discharge Orders     None         Dione Booze, MD 10/28/23 [id number]

## 2023-10-29 DIAGNOSIS — R112 Nausea with vomiting, unspecified: Secondary | ICD-10-CM | POA: Diagnosis not present

## 2023-10-29 LAB — CBC
HCT: 34 % — ABNORMAL LOW (ref 36.0–46.0)
Hemoglobin: 11.9 g/dL — ABNORMAL LOW (ref 12.0–15.0)
MCH: 33.7 pg (ref 26.0–34.0)
MCHC: 35 g/dL (ref 30.0–36.0)
MCV: 96.3 fL (ref 80.0–100.0)
Platelets: 336 10*3/uL (ref 150–400)
RBC: 3.53 MIL/uL — ABNORMAL LOW (ref 3.87–5.11)
RDW: 13.1 % (ref 11.5–15.5)
WBC: 16.9 10*3/uL — ABNORMAL HIGH (ref 4.0–10.5)
nRBC: 0 % (ref 0.0–0.2)

## 2023-10-29 LAB — COMPREHENSIVE METABOLIC PANEL
ALT: 22 U/L (ref 0–44)
AST: 21 U/L (ref 15–41)
Albumin: 4 g/dL (ref 3.5–5.0)
Alkaline Phosphatase: 45 U/L (ref 38–126)
Anion gap: 8 (ref 5–15)
BUN: 7 mg/dL (ref 6–20)
CO2: 20 mmol/L — ABNORMAL LOW (ref 22–32)
Calcium: 8.1 mg/dL — ABNORMAL LOW (ref 8.9–10.3)
Chloride: 106 mmol/L (ref 98–111)
Creatinine, Ser: 0.46 mg/dL (ref 0.44–1.00)
GFR, Estimated: >60 mL/min (ref >60)
Glucose, Bld: 94 mg/dL (ref 70–99)
Potassium: 3.6 mmol/L (ref 3.5–5.1)
Sodium: 134 mmol/L — ABNORMAL LOW (ref 135–145)
Total Bilirubin: 0.5 mg/dL (ref <1.2)
Total Protein: 6.6 g/dL (ref 6.5–8.1)

## 2023-10-29 LAB — GLUCOSE, CAPILLARY: Glucose-Capillary: 166 mg/dL — ABNORMAL HIGH (ref 70–99)

## 2023-10-29 LAB — HIV ANTIBODY (ROUTINE TESTING W REFLEX): HIV Screen 4th Generation wRfx: NONREACTIVE

## 2023-10-29 MED ORDER — ONDANSETRON 4 MG PO TBDP: 4.0000 mg | ORAL_TABLET | Freq: Three times a day (TID) | ORAL | 0 refills | Status: AC | PRN

## 2023-10-29 MED ORDER — ACETAMINOPHEN 325 MG PO TABS: 650.0000 mg | ORAL_TABLET | Freq: Four times a day (QID) | ORAL | Status: DC | PRN

## 2023-10-29 MED ORDER — IBUPROFEN 200 MG PO TABS
400.0000 mg | ORAL_TABLET | Freq: Four times a day (QID) | ORAL | Status: DC | PRN
  Administered 2023-10-29: 400 mg via ORAL
  Filled 2023-10-29: qty 2

## 2023-10-29 MED ORDER — K PHOS MONO-SOD PHOS DI & MONO 155-852-130 MG PO TABS
500.0000 mg | ORAL_TABLET | Freq: Three times a day (TID) | ORAL | Status: DC
  Administered 2023-10-29: 500 mg via ORAL
  Filled 2023-10-29 (×3): qty 2

## 2023-10-29 MED ORDER — LEVETIRACETAM 500 MG PO TABS: ORAL_TABLET | ORAL | 0 refills | Status: DC

## 2023-10-29 MED ORDER — ACETAMINOPHEN 650 MG RE SUPP
650.0000 mg | Freq: Four times a day (QID) | RECTAL | Status: DC | PRN
Start: 2023-10-29 — End: 2023-10-29

## 2023-10-29 NOTE — Hospital Course (Signed)
22 y.o. 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

## 2023-10-29 NOTE — Assessment & Plan Note (Signed)
No longer on sertraline.

## 2023-10-29 NOTE — Discharge Summary (Signed)
Physician Discharge Summary   Patient: Melinda Calderon MRN: [id number] DOB: 08/20/2001  Admit date:     10/28/2023  Discharge date: 10/29/23  Discharge Physician: Alberteen Sam   PCP: Christel Mormon, MD     Recommendations at discharge:  Follow up with PCP Dr. Sabino Dick for epilepsy Follow up with Dr. Karel Jarvis for epilepsy Dr. Karel Jarvis: Patient appears to be off Keppra, perampanel and Lamictal; was restarted on Keppra at d/c     Discharge Diagnoses: Principal Problem:   Intractable nausea and vomiting Active Problems:   Seizure disorder (HCC)   Panic disorder   Hypophosphatemia   Hypokalemia      Hospital Course: 22 y.o. 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      * Intractable nausea and vomiting Admitted with vomiting, diarrhea, cramps.  Treated with analgesics, anti-emetics, fluids.  Symptoms resolved with supportive care overnight.  Today, tolerated diet well, advanced to solid diet without difficulty, stable for discharge home.    Hypokalemia Supplemented  Hypophosphatemia Supplemented  Panic disorder No longer on sertraline.  Seizure disorder [facility]) Patient reported different dosing (and less) than prescribed doses of Keppra, and claimed adherence to Lamictal and Fycompa for seizures to one provider and to me reported she was out of all of these medicines. Last fill history at pharmacy is from June.   Last seen by Neurology 1 year ago and has canceled appointments since then due to lack of transportation.    Patient also gave conflicting reports of recent seizure activity.  Stated she had had two seizures while in the hospital (no documentation of seizure-like activity was signed out to me or documented by admitting team or nursing) and that she had a seizure at home recently but did not seek care or call her Neurologist.  Given concern about resuming Lamictal at previous dose,  and unfamiliarity with perampanel, these were not refilled at discharge.  Advised that she may have increased risk of seizure with suboptimal dosing of Keppra and other AEDs and recommended as soon as possible follow up with her Neurologist.             The [facility] Controlled Substances Registry was reviewed for this patient prior to discharge.  Consultants: None Procedures performed: None  Disposition: Home Diet recommendation:  Discharge Diet Orders (From admission, onward)     Start     Ordered   10/29/23 0000  Diet - low sodium heart healthy        10/29/23 1430             DISCHARGE MEDICATION: Allergies as of 10/29/2023   No Known Allergies      Medication List     TAKE these medications    Fycompa 4 MG Tabs Generic drug: Perampanel Take 1 tablet at bedtime   lamoTRIgine 100 MG tablet Commonly known as: LAMICTAL Take 2 tablets by mouth twice per day What changed:  how much to take how to take this when to take this additional instructions   levETIRAcetam 500 MG tablet Commonly known as: KEPPRA Take  3 tabs in AM, 3 tabs in PM What changed:  how much to take how to take this when to take this additional instructions   ondansetron 4 MG disintegrating tablet Commonly known as: Zofran ODT Take 1 tablet (4 mg total) by mouth every 8 (eight) hours as needed for nausea or vomiting.  Follow-up Information     Coccaro, Althea Grimmer, MD. Schedule an appointment as soon as possible for a visit in 1 week(s).   Specialty: Pediatrics Contact information: [address] [place] [address] [phone]         Van Clines, MD Follow up.   Specialty: Neurology Contact information: [address] [place] [address] [phone]                 Discharge Instructions     Diet - low sodium heart healthy   Complete by: As directed    Discharge instructions   Complete by: As directed    If  you have any more nausea, take ondansetron 4 mg by mouth up to three times daily  Resume your levetiracetam/Keppra seizure medicine at 1500 mg (three tabs) twice daily (morning and night)  Do not resume your other seizure medicines without talking to Dr. Karel Jarvis  Call her office to ensure you have a follow up appointment as soon as possible   Increase activity slowly   Complete by: As directed        Discharge Exam: There were no vitals filed for this visit.  General: Pt is alert, awake, not in acute distress Cardiovascular: RRR, nl S1-S2, no murmurs appreciated.   No LE edema.   Respiratory: Normal respiratory rate and rhythm.  CTAB without rales or wheezes. Abdominal: Abdomen soft without rigidity or focal tenderness.  She has guarding throughout, voluntary.  No distension or HSM.   Neuro/Psych: Strength symmetric in upper and lower extremities.  Judgment and insight appear normal.   Condition at discharge: fair  The results of significant diagnostics from this hospitalization (including imaging, microbiology, ancillary and laboratory) are listed below for reference.   Imaging Studies: No results found.  Microbiology: Results for orders placed or performed during the hospital encounter of 12/31/20  SARS CORONAVIRUS 2 (TAT 6-24 HRS) Nasopharyngeal Nasopharyngeal Swab     Status: None   Collection Time: 12/31/20  4:55 AM   Specimen: Nasopharyngeal Swab  Result Value Ref Range Status   SARS Coronavirus 2 NEGATIVE NEGATIVE Final    Comment: (NOTE) SARS-CoV-2 target nucleic acids are NOT DETECTED.  The SARS-CoV-2 RNA is generally detectable in upper and lower respiratory specimens during the acute phase of infection. Negative results do not preclude SARS-CoV-2 infection, do not rule out co-infections with other pathogens, and should not be used as the sole basis for treatment or other patient management decisions. Negative results must be combined with clinical  observations, patient history, and epidemiological information. The expected result is Negative.  Fact Sheet for Patients: HairSlick.no  Fact Sheet for Healthcare Providers: quierodirigir.com  This test is not yet approved or cleared by the [place] FDA and  has been authorized for detection and/or diagnosis of SARS-CoV-2 by FDA under an Emergency Use Authorization (EUA). This EUA will remain  in effect (meaning this test can be used) for the duration of the COVID-19 declaration under Se ction 564(b)(1) of the Act, 21 U.S.C. section 360bbb-3(b)(1), unless the authorization is terminated or revoked sooner.  Performed at [facility], [address], [place], [place] [address]     Labs: CBC: Recent Labs  Lab 10/28/23 0535 10/29/23 0555  WBC 14.1* 16.9*  NEUTROABS 9.6*  --   HGB 14.2 11.9*  HCT 41.0 34.0*  MCV 94.3 96.3  PLT 427* 336   Basic Metabolic Panel: Recent Labs  Lab 10/28/23 0535 10/28/23 0853  10/29/23 0555  NA 138  --  134*  K 3.1*  --  3.6  CL 103  --  106  CO2 15*  --  20*  GLUCOSE 164*  --  94  BUN 10  --  7  CREATININE 0.63  --  0.46  CALCIUM 10.0  --  8.1*  MG  --  1.9  --   PHOS  --  2.1*  --    Liver Function Tests: Recent Labs  Lab 10/28/23 0535 10/29/23 0555  AST 34 21  ALT 30 22  ALKPHOS 53 45  BILITOT 0.7 0.5  PROT 8.2* 6.6  ALBUMIN 4.9 4.0   CBG: Recent Labs  Lab 10/28/23 0601  GLUCAP 166*    Discharge time spent: approximately 35 minutes spent on discharge counseling, evaluation of patient on day of discharge, and coordination of discharge planning with nursing, social work, pharmacy and case management  Signed: Alberteen Sam, MD [facility] 10/29/2023

## 2023-10-29 NOTE — Assessment & Plan Note (Signed)
Supplemented

## 2023-10-29 NOTE — Assessment & Plan Note (Signed)
Admitted with vomiting, diarrhea, cramps.  Treated with analgesics, anti-emetics, fluids.  Symptoms resolved with supportive care overnight.  Today, tolerated diet well, advanced to solid diet without difficulty, stable for discharge home.

## 2023-10-29 NOTE — Assessment & Plan Note (Signed)
Patient reported different dosing (and less) than prescribed doses of Keppra, Lamictal and Fycompa for seizures. Last seen by Neurology 1 year ago and has canceled appointments since then.  Last fill history at pharmacy is from June.   Patient states Melinda Calderon has had no recent seizures.  Given concern about resuming Lamictal at previous dose, and unfamiliarity with perampanel, these were not refilled at discharge.  Advised that Melinda Calderon may have increased risk of seizure with suboptimal dosing of Keppra and other AEDs and recommended as soon as possible follow up with her Neurologist.

## 2023-10-29 NOTE — Progress Notes (Signed)
   10/29/23 1225  TOC Brief Assessment  Insurance and Status Reviewed  Patient has primary care physician Yes  Home environment has been reviewed Single family home  Prior level of function: Independent  Prior/Current Home Services No current home services  Social Determinants of Health Reivew SDOH reviewed no interventions necessary  Readmission risk has been reviewed Yes  Transition of care needs no transition of care needs at this time

## 2023-10-29 NOTE — Assessment & Plan Note (Signed)
Supplemented 

## 2023-10-29 NOTE — Discharge Instructions (Signed)
FOOD PANTRY [employer] [address] [phone]  [facility] [employer] [address] [place] [phone]  [facility] [address] [place] [phone]  [facility] [address] [phone]  [facility] - Food Distribution [facility] [address] [place], [place] [address] [phone]  UTILITIES [facility] [address] [place] [phone] Rental assistance/rental hotline: [phone] ext. 340.    Utility assistance/utility hotline: [phone] ext. [address] Department of IT consultant (heating/cooling and water assistance) [phone] (rental and utility assistance) [phone]  [employer] - call 211  TRANSPORTATION [facility] [address] [place], [place] [address] [phone]  [employer] [place] I-Ride Reservations Line: [phone]     Passengers can simply call the [employer] reservations number at [phone] for pickup. However, with [employer] same-day service is available with at least two hour notice Monday through Friday. You will need your [employer] [place] client ID# when you call for reservations. If you do not know it, you can call [phone] to request it. [employer] offers a flat fare of $8.50 per trip. This will cover travel anywhere within the city limits of [place].

## 2023-12-15 ENCOUNTER — Other Ambulatory Visit: Payer: Self-pay

## 2023-12-15 ENCOUNTER — Encounter (HOSPITAL_COMMUNITY): Payer: Self-pay

## 2023-12-15 ENCOUNTER — Emergency Department (HOSPITAL_COMMUNITY): Payer: Medicaid Other

## 2023-12-15 ENCOUNTER — Inpatient Hospital Stay (HOSPITAL_COMMUNITY)
Admission: EM | Admit: 2023-12-15 | Discharge: 2023-12-22 | DRG: 101 | Disposition: A | Discharge status: Home / Self Care | Payer: Medicaid Other | Attending: Internal Medicine | Admitting: Internal Medicine

## 2023-12-15 DIAGNOSIS — G4700 Insomnia, unspecified: Secondary | ICD-10-CM | POA: Diagnosis present

## 2023-12-15 DIAGNOSIS — E876 Hypokalemia: Secondary | ICD-10-CM | POA: Diagnosis present

## 2023-12-15 DIAGNOSIS — G40309 Generalized idiopathic epilepsy and epileptic syndromes, not intractable, without status epilepticus: Secondary | ICD-10-CM

## 2023-12-15 DIAGNOSIS — Z820 Family history of epilepsy and other diseases of the nervous system: Secondary | ICD-10-CM

## 2023-12-15 DIAGNOSIS — R569 Unspecified convulsions: Secondary | ICD-10-CM | POA: Diagnosis not present

## 2023-12-15 DIAGNOSIS — G40401 Other generalized epilepsy and epileptic syndromes, not intractable, with status epilepticus: Principal | ICD-10-CM | POA: Diagnosis present

## 2023-12-15 DIAGNOSIS — R4020 Unspecified coma: Secondary | ICD-10-CM | POA: Diagnosis not present

## 2023-12-15 DIAGNOSIS — Z79899 Other long term (current) drug therapy: Secondary | ICD-10-CM

## 2023-12-15 DIAGNOSIS — J9601 Acute respiratory failure with hypoxia: Secondary | ICD-10-CM

## 2023-12-15 DIAGNOSIS — G40901 Epilepsy, unspecified, not intractable, with status epilepticus: Secondary | ICD-10-CM

## 2023-12-15 DIAGNOSIS — R112 Nausea with vomiting, unspecified: Secondary | ICD-10-CM | POA: Diagnosis present

## 2023-12-15 DIAGNOSIS — Z91148 Patient's other noncompliance with medication regimen for other reason: Secondary | ICD-10-CM

## 2023-12-15 DIAGNOSIS — Z8249 Family history of ischemic heart disease and other diseases of the circulatory system: Secondary | ICD-10-CM

## 2023-12-15 DIAGNOSIS — N390 Urinary tract infection, site not specified: Secondary | ICD-10-CM | POA: Diagnosis present

## 2023-12-15 DIAGNOSIS — R0902 Hypoxemia: Secondary | ICD-10-CM | POA: Diagnosis not present

## 2023-12-15 DIAGNOSIS — K219 Gastro-esophageal reflux disease without esophagitis: Secondary | ICD-10-CM | POA: Diagnosis present

## 2023-12-15 LAB — CBC WITH DIFFERENTIAL/PLATELET
Abs Immature Granulocytes: 0.01 10*3/uL (ref 0.00–0.07)
Basophils Absolute: 0 10*3/uL (ref 0.0–0.1)
Basophils Relative: 1 %
Eosinophils Absolute: 0 10*3/uL (ref 0.0–0.5)
Eosinophils Relative: 1 %
HCT: 42 % (ref 36.0–46.0)
Hemoglobin: 13.8 g/dL (ref 12.0–15.0)
Immature Granulocytes: 0 %
Lymphocytes Relative: 46 %
Lymphs Abs: 2.4 10*3/uL (ref 0.7–4.0)
MCH: 32.3 pg (ref 26.0–34.0)
MCHC: 32.9 g/dL (ref 30.0–36.0)
MCV: 98.4 fL (ref 80.0–100.0)
Monocytes Absolute: 0.4 10*3/uL (ref 0.1–1.0)
Monocytes Relative: 7 %
Neutro Abs: 2.3 10*3/uL (ref 1.7–7.7)
Neutrophils Relative %: 45 %
Platelets: 277 10*3/uL (ref 150–400)
RBC: 4.27 MIL/uL (ref 3.87–5.11)
RDW: 13.2 % (ref 11.5–15.5)
WBC: 5.2 10*3/uL (ref 4.0–10.5)
nRBC: 0 % (ref 0.0–0.2)

## 2023-12-15 LAB — COMPREHENSIVE METABOLIC PANEL
ALT: 19 U/L (ref 0–44)
AST: 20 U/L (ref 15–41)
Albumin: 4.8 g/dL (ref 3.5–5.0)
Alkaline Phosphatase: 49 U/L (ref 38–126)
Anion gap: 12 (ref 5–15)
BUN: 10 mg/dL (ref 6–20)
CO2: 20 mmol/L — ABNORMAL LOW (ref 22–32)
Calcium: 9.3 mg/dL (ref 8.9–10.3)
Chloride: 104 mmol/L (ref 98–111)
Creatinine, Ser: 0.72 mg/dL (ref 0.44–1.00)
GFR, Estimated: >60 mL/min (ref >60)
Glucose, Bld: 88 mg/dL (ref 70–99)
Potassium: 3.9 mmol/L (ref 3.5–5.1)
Sodium: 136 mmol/L (ref 135–145)
Total Bilirubin: 0.5 mg/dL (ref 0.0–1.2)
Total Protein: 7.6 g/dL (ref 6.5–8.1)

## 2023-12-15 LAB — HCG, QUANTITATIVE, PREGNANCY: hCG, Beta Chain, Quant, S: <1 m[IU]/mL (ref <5)

## 2023-12-15 LAB — ETHANOL: Alcohol, Ethyl (B): <10 mg/dL (ref <10)

## 2023-12-15 LAB — CBG MONITORING, ED: Glucose-Capillary: 88 mg/dL (ref 70–99)

## 2023-12-15 LAB — MAGNESIUM: Magnesium: 2 mg/dL (ref 1.7–2.4)

## 2023-12-15 MED ORDER — SODIUM CHLORIDE 0.9 % IV SOLN: 2000.0000 mg | Freq: Once | INTRAVENOUS | Status: DC

## 2023-12-15 MED ORDER — LEVETIRACETAM IN NACL 1000 MG/100ML IV SOLN
1000.0000 mg | Freq: Once | INTRAVENOUS | Status: AC
  Administered 2023-12-15: 1000 mg via INTRAVENOUS
  Filled 2023-12-15: qty 100

## 2023-12-15 MED ORDER — LAMOTRIGINE 100 MG PO TABS
100.0000 mg | ORAL_TABLET | Freq: Two times a day (BID) | ORAL | Status: DC
Start: 2023-12-15 — End: 2023-12-19
  Administered 2023-12-15 – 2023-12-19 (×8): 100 mg via ORAL
  Filled 2023-12-15 (×8): qty 1

## 2023-12-15 MED ORDER — ONDANSETRON HCL 4 MG/2ML IJ SOLN
4.0000 mg | Freq: Once | INTRAMUSCULAR | Status: AC
  Administered 2023-12-15: 4 mg via INTRAMUSCULAR

## 2023-12-15 MED ORDER — LEVETIRACETAM 500 MG PO TABS
1500.0000 mg | ORAL_TABLET | Freq: Two times a day (BID) | ORAL | Status: DC
  Administered 2023-12-16 – 2023-12-19 (×7): 1500 mg via ORAL
  Filled 2023-12-15 (×7): qty 3

## 2023-12-15 MED ORDER — PERAMPANEL 2 MG PO TABS
4.0000 mg | ORAL_TABLET | Freq: Every day | ORAL | Status: DC
  Administered 2023-12-17 – 2023-12-18 (×3): 4 mg via ORAL
  Filled 2023-12-15 (×3): qty 2

## 2023-12-15 MED ORDER — ONDANSETRON HCL 4 MG/2ML IJ SOLN
4.0000 mg | Freq: Once | INTRAMUSCULAR | Status: DC
  Filled 2023-12-15: qty 2

## 2023-12-15 NOTE — H&P (Signed)
 History and Physical    PatientBETHA LOURDES Calderon FMW:[id number] DOB: November 18, 2001 DOA: 12/15/2023 DOS: the patient was seen and examined on 12/15/2023 PCP: Doreene Maude PARAS, MD  Patient coming from: Home  Chief Complaint:  Chief Complaint  Patient presents with   Seizures   HPI: Melinda Calderon is a 23 y.o. 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  The patient's mom gives me all of the history.  She reports that she came home yesterday and found the patient was difficult to arouse and had urinated on herself.  She had a little seizure activity as well.  She had a second episode of being incontinent of urine and some seizure activit so the mom was watching her carefully.  The patient also has had slurred speech and been very difficult to understand which is not her baseline.  She watched the patient overnight and this morning it turns out that the patient had no more of her seizure medication she had run out.  The patient's sister visited her and was very worried about her speech and not being able to understand her.  The patient also had a very distant look in her eye and was not fully making eye contact that very much worried the family so they ended up calling 911.  The patient had been eating fine up until before EMS arrived and she started vomiting violently. In the emergency department she continued to vomit until that was controlled with medication.  She did have a episode of some shaking which was witnessed by the ED doctor.  That episode was not felt to be true seizures per the ED doctor.  But with the patient not being at baseline and having a couple of episodes of what may have been seizures with incontinence it was felt best to admit her for observation.  Review of Systems: unable to review all systems due to the inability of the patient to answer questions. Past Medical History:  Diagnosis Date   Anxiety    GERD  (gastroesophageal reflux disease)    Seizures (HCC)    History reviewed. No pertinent surgical history. Social History:  reports that she has never smoked. She has never used smokeless tobacco. She reports current alcohol use. She reports that she does not use drugs.  No Known Allergies  Family History  Problem Relation Age of Onset   Hypertension Mother    Cancer Maternal Grandmother        Died at 77   Cancer Paternal Grandmother        Died at 46   Epilepsy Maternal Grandfather    Cirrhosis Maternal Grandfather        Died at 48   Seizures Paternal Grandfather        Died at 58   Epilepsy Paternal Aunt     Prior to Admission medications   Medication Sig Start Date End Date Taking? Authorizing Provider  FYCOMPA  4 MG TABS Take 1 tablet at bedtime 11/26/22   Aquino, Karen M, MD  lamoTRIgine  (LAMICTAL ) 100 MG tablet Take 2 tablets by mouth twice per day Patient taking differently: Take 100 mg by mouth in the morning, at noon, and at bedtime. 11/26/22   Georjean Darice HERO, MD  levETIRAcetam  (KEPPRA ) 500 MG tablet Take  3 tabs in AM, 3 tabs in PM 10/29/23   Danford, Lonni SQUIBB, MD  ondansetron  (ZOFRAN  ODT) 4 MG disintegrating tablet Take 1 tablet (4 mg total) by mouth  every 8 (eight) hours as needed for nausea or vomiting. 10/29/23   Danford, Lonni SQUIBB, MD  dicyclomine  (BENTYL ) 20 MG tablet Take 1 tablet (20 mg total) by mouth 2 (two) times daily. 06/28/20 12/31/20  Joy, Shawn C, PA-C  famotidine  (PEPCID ) 20 MG tablet Take 1 tablet (20 mg total) by mouth 2 (two) times daily. 10/07/20 12/31/20  Garrick Charleston, MD    Physical Exam: Vitals:   12/15/23 1801 12/15/23 1927 12/15/23 1930  BP: 129/86 121/68 111/72  Pulse: 83 87 87  Resp: 19 19 (!) 23  Temp: (!) 97.5 F (36.4 C)    TempSrc: Oral    SpO2: (!) 78% 97% 100%   Physical Exam:  General: No acute distress, well developed, well nourished HEENT: Normocephalic, atraumatic, PERRL Cardiovascular: Normal rate and rhythm.  Distal pulses intact. Pulmonary: Normal pulmonary effort, normal breath sounds Gastrointestinal: Nondistended abdomen, soft, mild tenderness diffusely, normoactive bowel sounds Musculoskeletal:Normal ROM, no lower ext edema Skin: Skin is warm and dry. Neuro: No focal deficits noted, difficult to arouse when her eyes are closed but she wakes up to talk when she needs to. Sometimes her speech was very slurred at other times it was clearer.  She let her mom do most of the talking. I told her mom that I recommend getting her a therapist and her eyes shot open where she had been difficult to arouse prior.  PSYCH: drowsy, intermittently cooperative  Data Reviewed:  Results for orders placed or performed during the hospital encounter of 12/15/23 (from the past 24 hours)  CBC with Differential     Status: None   Collection Time: 12/15/23  7:06 PM  Result Value Ref Range   WBC 5.2 4.0 - 10.5 K/uL   RBC 4.27 3.87 - 5.11 MIL/uL   Hemoglobin 13.8 12.0 - 15.0 g/dL   HCT 57.9 63.9 - 53.9 %   MCV 98.4 80.0 - 100.0 fL   MCH 32.3 26.0 - 34.0 pg   MCHC 32.9 30.0 - 36.0 g/dL   RDW 86.7 88.4 - 84.4 %   Platelets 277 150 - 400 K/uL   nRBC 0.0 0.0 - 0.2 %   Neutrophils Relative % 45 %   Neutro Abs 2.3 1.7 - 7.7 K/uL   Lymphocytes Relative 46 %   Lymphs Abs 2.4 0.7 - 4.0 K/uL   Monocytes Relative 7 %   Monocytes Absolute 0.4 0.1 - 1.0 K/uL   Eosinophils Relative 1 %   Eosinophils Absolute 0.0 0.0 - 0.5 K/uL   Basophils Relative 1 %   Basophils Absolute 0.0 0.0 - 0.1 K/uL   Immature Granulocytes 0 %   Abs Immature Granulocytes 0.01 0.00 - 0.07 K/uL  Comprehensive metabolic panel     Status: Abnormal   Collection Time: 12/15/23  7:06 PM  Result Value Ref Range   Sodium 136 135 - 145 mmol/L   Potassium 3.9 3.5 - 5.1 mmol/L   Chloride 104 98 - 111 mmol/L   CO2 20 (L) 22 - 32 mmol/L   Glucose, Bld 88 70 - 99 mg/dL   BUN 10 6 - 20 mg/dL   Creatinine, Ser 9.27 0.44 - 1.00 mg/dL   Calcium 9.3 8.9 -  89.6 mg/dL   Total Protein 7.6 6.5 - 8.1 g/dL   Albumin 4.8 3.5 - 5.0 g/dL   AST 20 15 - 41 U/L   ALT 19 0 - 44 U/L   Alkaline Phosphatase 49 38 - 126 U/L   Total Bilirubin 0.5 0.0 -  1.2 mg/dL   GFR, Estimated >39 >39 mL/min   Anion gap 12 5 - 15  Magnesium      Status: None   Collection Time: 12/15/23  7:06 PM  Result Value Ref Range   Magnesium  2.0 1.7 - 2.4 mg/dL  Ethanol     Status: None   Collection Time: 12/15/23  7:07 PM  Result Value Ref Range   Alcohol, Ethyl (B) <10 <10 mg/dL  hCG, quantitative, pregnancy     Status: None   Collection Time: 12/15/23  7:08 PM  Result Value Ref Range   hCG, Beta Chain, Quant, S <1 <5 mIU/mL  POC CBG, ED     Status: None   Collection Time: 12/15/23  7:33 PM  Result Value Ref Range   Glucose-Capillary 88 70 - 99 mg/dL     Assessment and Plan: Seizures and pseudoseizures - the patient is out of all of her medication so she was loaded with Keppra  in the emergency department  - monitor - Consider EEG - Resume home meds  2. Nausea/vomiting - symptomatic care   Advance Care Planning:   Code Status: Prior The patient is not able to cooperate.  Her mom is at bedside.  Consults: none  Family Communication: mom at bedside  Severity of Illness: The appropriate patient status for this patient is OBSERVATION. Observation status is judged to be reasonable and necessary in order to provide the required intensity of service to ensure the patient's safety. The patient's presenting symptoms, physical exam findings, and initial radiographic and laboratory data in the context of their medical condition is felt to place them at decreased risk for further clinical deterioration. Furthermore, it is anticipated that the patient will be medically stable for discharge from the hospital within 2 midnights of admission.   Author: ARTHEA CHILD, MD 12/15/2023 8:46 PM  For on call review [email or web address].

## 2023-12-15 NOTE — ED Provider Notes (Signed)
 [place] EMERGENCY DEPARTMENT AT [facility] Provider Note   CSN: [id number] Arrival date & time: 12/15/23  1703     History  Chief Complaint  Patient presents with   Seizures    Melinda Calderon is a 23 y.o. 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  Pt has nausea, vomiting and abdominal pain with some constipation last Monday and now diarrhea x 3 days.   Pt reports Insomnia that is increasing in frequency and sezure activity while sleeping,   Pt took last does of Lamictal  yesterday, but mother at bedside is concerned that patient is not keeping any of it down.  Mother reports that patient has had multiple episodes of seizures today.  Mother reports that patient is not speaking normally and she is very lethargic.  Patient continues to have vomiting here in the emergency department.  While I am in the room, patient has an episode of seizure-like activity that includes her eyes rolling back in her head, eyes fluttering, and posturing of her arms.  It lasted approximately 10 seconds and patient returns back to the way that she was before the seizure, which mother states is also not her baseline.  Patient is lethargic but oriented x 3.  She also ports an episode of loss of bladder earlier today.  Per chart review patient had admission in November 2024 for intractable nausea vomiting and seizures.  Was also found to have hypokalemia and hypophosphatemia.  Concern for noncompliance with medications at that time.    Past Medical History:  Diagnosis Date   Anxiety    GERD (gastroesophageal reflux disease)    Seizures (HCC)        Home Medications Prior to Admission medications   Medication Sig Start Date End Date Taking? Authorizing Provider  FYCOMPA  4 MG TABS Take 1 tablet at bedtime 11/26/22   Aquino, Karen M, MD  lamoTRIgine  (LAMICTAL ) 100 MG tablet Take 2 tablets by mouth twice  per day Patient taking differently: Take 100 mg by mouth in the morning, at noon, and at bedtime. 11/26/22   Georjean Darice HERO, MD  levETIRAcetam  (KEPPRA ) 500 MG tablet Take  3 tabs in AM, 3 tabs in PM 10/29/23   Danford, Lonni SQUIBB, MD  ondansetron  (ZOFRAN  ODT) 4 MG disintegrating tablet Take 1 tablet (4 mg total) by mouth every 8 (eight) hours as needed for nausea or vomiting. 10/29/23   Danford, Lonni SQUIBB, MD  dicyclomine  (BENTYL ) 20 MG tablet Take 1 tablet (20 mg total) by mouth 2 (two) times daily. 06/28/20 12/31/20  Joy, Shawn C, PA-C  famotidine  (PEPCID ) 20 MG tablet Take 1 tablet (20 mg total) by mouth 2 (two) times daily. 10/07/20 12/31/20  Garrick Charleston, MD      Allergies    Patient has no known allergies.    Review of Systems   Review of Systems A 10 point review of systems was performed and is negative unless otherwise reported in HPI.  Physical Exam Updated Vital Signs BP 111/72 (BP Location: Left Arm)   Pulse 87   Temp (!) 97.5 F (36.4 C) (Oral)   Resp (!) 23   SpO2 100%  Physical Exam General: lethargic appearing female, lying in bed. Episode of NBNB vomiting. HEENT: PERRLA midrange, EOMI with some intermittent nystagmus present, Sclera anicteric, MMM, trachea midline.  Cardiology: RRR, no murmurs/rubs/gallops.  Resp: Normal respiratory rate and effort. CTAB, no wheezes, rhonchi, crackles.  Abd: Soft, non-tender, non-distended. No rebound tenderness or guarding.  GU: Deferred. MSK: No peripheral edema or signs of trauma. Extremities without deformity or TTP. No cyanosis or clubbing. Skin: warm, dry.  Neuro: Lethargic but oriented x 3, CNs II-XII grossly intact. MAEs. Sensation grossly intact.  Slurred speech.  Tongue protrudes midline.  No tongue fasciculations.  ED Results / Procedures / Treatments   Labs (all labs ordered are listed, but only abnormal results are displayed) Labs Reviewed  COMPREHENSIVE METABOLIC PANEL - Abnormal; Notable for the following  components:      Result Value   CO2 20 (*)    All other components within normal limits  CBC WITH DIFFERENTIAL/PLATELET  MAGNESIUM   ETHANOL  HCG, QUANTITATIVE, PREGNANCY  LAMOTRIGINE  LEVEL  RAPID URINE DRUG SCREEN, HOSP PERFORMED  URINALYSIS, ROUTINE W REFLEX MICROSCOPIC  LEVETIRACETAM  LEVEL  CBG MONITORING, ED    EKG EKG Interpretation Date/Time:  Tuesday December 15 2023 19:23:23 EST Ventricular Rate:  89 PR Interval:  155 QRS Duration:  69 QT Interval:  379 QTC Calculation: 462 R Axis:   88  Text Interpretation: Sinus rhythm Confirmed by Franklyn Gills [phone]) on 12/15/2023 7:40:19 PM  Radiology No results found.  Procedures Procedures    Medications Ordered in ED Medications  levETIRAcetam  (KEPPRA ) IVPB 1000 mg/100 mL premix (1,000 mg Intravenous New Bag/Given 12/15/23 1953)    And  levETIRAcetam  (KEPPRA ) IVPB 1000 mg/100 mL premix (0 mg Intravenous Stopped 12/15/23 1952)  ondansetron  (ZOFRAN ) injection 4 mg (4 mg Intramuscular Given 12/15/23 1909)    ED Course/ Medical Decision Making/ A&P                          Medical Decision Making Amount and/or Complexity of Data Reviewed Labs: ordered. Decision-making details documented in ED Course. Radiology: ordered. Decision-making details documented in ED Course.  Risk Prescription drug management. Decision regarding hospitalization.    This patient presents to the ED for concern of N/V, seizures, this involves an extensive number of treatment options, and is a complaint that carries with it a high risk of complications and morbidity.  I considered the following differential and admission for this acute, potentially life threatening condition.   MDM:     Presents with multiple episodes of seizure-like activity over the last several days, worsening today in the setting of intractable nausea vomiting with no return to baseline mental status.  The observed seizure-like activity in the room is most likely PNES in  nature.  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  She is reportedly not at baseline per the mother and could be postictal from epileptic activity. To support this she states she did have an episode of loss of bladder earlier today. She has the exact same mental status before and after the seizure-like episode noted here in the ED, however mother notes that this is not her baseline.  She has had intractable nausea vomiting at home and likely has not kept on her Lamictal .  Will order both the Keppra  and Lamictal  level.  Will give Keppra  IV 40 mg/kg here in the ED.  She does not have any electrolyte derangements and her CT head is within normal limits.  She does not have any tremors or history of alcohol use to indicate alcohol withdrawal.  Will also get UDS.  Patient likely need admission for intractable nausea vomiting and inability to tolerate her AEDs.   Clinical Course  as of 12/15/23 2048  Tue Dec 15, 2023  1939 Glucose-Capillary: 48 [HN]  1940 CBC with Differential wnl [HN]  1948 SpO2(!): 78 % Erroneous value. Patient was not hypoxic.  [HN]  2010 HCG, Beta Chain, Quant, S: <1 neg [HN]  2031 CT Head Wo Contrast NAICP on my interpretation [HN]  2032 Comprehensive metabolic panel(!) Unremarkable in the context of this patient's presentation  [HN]    Clinical Course User Index [HN] Franklyn Sid SAILOR, MD    Labs: I Ordered, and personally interpreted labs.  The pertinent results include: Those listed above  Imaging Studies ordered: I ordered imaging studies including CT head I independently visualized and interpreted imaging. I agree with the radiologist interpretation  Additional history obtained from chart review, mother at bedside.    Cardiac Monitoring: The patient was maintained on a cardiac monitor.  I personally viewed and interpreted the cardiac monitored which showed an underlying rhythm of: Normal sinus  rhythm  Reevaluation: After the interventions noted above, I reevaluated the patient and found that they have :improved  Social Determinants of Health:  lives independently  Disposition:  Admit to hospitalist  Co morbidities that complicate the patient evaluation  Past Medical History:  Diagnosis Date   Anxiety    GERD (gastroesophageal reflux disease)    Seizures (HCC)      Medicines Meds ordered this encounter  Medications   DISCONTD: ondansetron  (ZOFRAN ) injection 4 mg   DISCONTD: levETIRAcetam  (KEPPRA ) 2,000 mg in sodium chloride  0.9 % 250 mL IVPB   AND Linked Order Group    levETIRAcetam  (KEPPRA ) IVPB 1000 mg/100 mL premix    levETIRAcetam  (KEPPRA ) IVPB 1000 mg/100 mL premix   ondansetron  (ZOFRAN ) injection 4 mg    I have reviewed the patients home medicines and have made adjustments as needed  Problem List / ED Course: Problem List Items Addressed This Visit   None Visit Diagnoses       Seizure-like activity (HCC)    -  Primary     Nausea and vomiting, unspecified vomiting type                       This note was created using dictation software, which may contain spelling or grammatical errors.    Franklyn Sid SAILOR, MD 12/15/23 [phone]

## 2023-12-15 NOTE — ED Notes (Signed)
 Pt said she cannot give a urine specimen at the moment

## 2023-12-15 NOTE — ED Triage Notes (Addendum)
 Pt BIBA for seizure activity that has increased in frequency.  Pt has nausea, vomiting and abdominal pain with some constipation last Monday and now diarrhea x 3 days   Pt reports Insomnia that is increasing in frequency.   and sezure activity while sleeping,   Pt took last does of Lamictal  yesterday,   Pt is postictal, A&O x 4 but lethargic

## 2023-12-16 ENCOUNTER — Inpatient Hospital Stay (HOSPITAL_COMMUNITY)
Admit: 2023-12-16 | Discharge: 2023-12-16 | Disposition: A | Discharge status: Home / Self Care | Payer: Medicaid Other | Attending: Internal Medicine | Admitting: Internal Medicine

## 2023-12-16 DIAGNOSIS — J9601 Acute respiratory failure with hypoxia: Secondary | ICD-10-CM | POA: Diagnosis not present

## 2023-12-16 DIAGNOSIS — N3000 Acute cystitis without hematuria: Secondary | ICD-10-CM | POA: Diagnosis not present

## 2023-12-16 DIAGNOSIS — R112 Nausea with vomiting, unspecified: Secondary | ICD-10-CM | POA: Diagnosis present

## 2023-12-16 DIAGNOSIS — R569 Unspecified convulsions: Secondary | ICD-10-CM | POA: Diagnosis present

## 2023-12-16 DIAGNOSIS — Z820 Family history of epilepsy and other diseases of the nervous system: Secondary | ICD-10-CM | POA: Diagnosis not present

## 2023-12-16 DIAGNOSIS — G40401 Other generalized epilepsy and epileptic syndromes, not intractable, with status epilepticus: Secondary | ICD-10-CM | POA: Diagnosis present

## 2023-12-16 DIAGNOSIS — K219 Gastro-esophageal reflux disease without esophagitis: Secondary | ICD-10-CM | POA: Diagnosis present

## 2023-12-16 DIAGNOSIS — Z8249 Family history of ischemic heart disease and other diseases of the circulatory system: Secondary | ICD-10-CM | POA: Diagnosis not present

## 2023-12-16 DIAGNOSIS — E876 Hypokalemia: Secondary | ICD-10-CM | POA: Diagnosis present

## 2023-12-16 DIAGNOSIS — G40901 Epilepsy, unspecified, not intractable, with status epilepticus: Secondary | ICD-10-CM | POA: Diagnosis not present

## 2023-12-16 DIAGNOSIS — Z91148 Patient's other noncompliance with medication regimen for other reason: Secondary | ICD-10-CM | POA: Diagnosis not present

## 2023-12-16 DIAGNOSIS — N390 Urinary tract infection, site not specified: Secondary | ICD-10-CM | POA: Diagnosis present

## 2023-12-16 DIAGNOSIS — G4700 Insomnia, unspecified: Secondary | ICD-10-CM | POA: Diagnosis present

## 2023-12-16 DIAGNOSIS — Z79899 Other long term (current) drug therapy: Secondary | ICD-10-CM | POA: Diagnosis not present

## 2023-12-16 DIAGNOSIS — R0902 Hypoxemia: Secondary | ICD-10-CM | POA: Diagnosis not present

## 2023-12-16 DIAGNOSIS — R4020 Unspecified coma: Secondary | ICD-10-CM | POA: Diagnosis not present

## 2023-12-16 LAB — URINALYSIS, ROUTINE W REFLEX MICROSCOPIC
Bilirubin Urine: NEGATIVE
Glucose, UA: NEGATIVE mg/dL
Hgb urine dipstick: NEGATIVE
Ketones, ur: NEGATIVE mg/dL
Nitrite: POSITIVE — AB
Protein, ur: 30 mg/dL — AB
Specific Gravity, Urine: 1.028 (ref 1.005–1.030)
WBC, UA: >50 WBC/hpf (ref 0–5)
pH: 5 (ref 5.0–8.0)

## 2023-12-16 LAB — CBC
HCT: 38.4 % (ref 36.0–46.0)
Hemoglobin: 12.9 g/dL (ref 12.0–15.0)
MCH: 32.7 pg (ref 26.0–34.0)
MCHC: 33.6 g/dL (ref 30.0–36.0)
MCV: 97.2 fL (ref 80.0–100.0)
Platelets: 283 10*3/uL (ref 150–400)
RBC: 3.95 MIL/uL (ref 3.87–5.11)
RDW: 13 % (ref 11.5–15.5)
WBC: 7.6 10*3/uL (ref 4.0–10.5)
nRBC: 0.3 % — ABNORMAL HIGH (ref 0.0–0.2)

## 2023-12-16 LAB — COMPREHENSIVE METABOLIC PANEL
ALT: 18 U/L (ref 0–44)
AST: 15 U/L (ref 15–41)
Albumin: 4.3 g/dL (ref 3.5–5.0)
Alkaline Phosphatase: 46 U/L (ref 38–126)
Anion gap: 10 (ref 5–15)
BUN: 9 mg/dL (ref 6–20)
CO2: 18 mmol/L — ABNORMAL LOW (ref 22–32)
Calcium: 9.2 mg/dL (ref 8.9–10.3)
Chloride: 106 mmol/L (ref 98–111)
Creatinine, Ser: 0.56 mg/dL (ref 0.44–1.00)
GFR, Estimated: >60 mL/min (ref >60)
Glucose, Bld: 92 mg/dL (ref 70–99)
Potassium: 3.5 mmol/L (ref 3.5–5.1)
Sodium: 134 mmol/L — ABNORMAL LOW (ref 135–145)
Total Bilirubin: 0.6 mg/dL (ref 0.0–1.2)
Total Protein: 6.8 g/dL (ref 6.5–8.1)

## 2023-12-16 LAB — RAPID URINE DRUG SCREEN, HOSP PERFORMED
Amphetamines: NOT DETECTED
Barbiturates: NOT DETECTED
Benzodiazepines: POSITIVE — AB
Cocaine: NOT DETECTED
Opiates: NOT DETECTED
Tetrahydrocannabinol: POSITIVE — AB

## 2023-12-16 LAB — MAGNESIUM: Magnesium: 2.2 mg/dL (ref 1.7–2.4)

## 2023-12-16 MED ORDER — ORAL CARE MOUTH RINSE: 15.0000 mL | OROMUCOSAL | Status: DC | PRN

## 2023-12-16 MED ORDER — POTASSIUM CHLORIDE CRYS ER 20 MEQ PO TBCR
40.0000 meq | EXTENDED_RELEASE_TABLET | Freq: Once | ORAL | Status: AC
  Administered 2023-12-16: 40 meq via ORAL
  Filled 2023-12-16: qty 2

## 2023-12-16 MED ORDER — SODIUM CHLORIDE 0.9 % IV SOLN
1.0000 g | INTRAVENOUS | Status: DC
  Administered 2023-12-16 – 2023-12-20 (×4): 1 g via INTRAVENOUS
  Filled 2023-12-16 (×5): qty 10

## 2023-12-16 MED ORDER — ENOXAPARIN SODIUM 40 MG/0.4ML IJ SOSY
40.0000 mg | PREFILLED_SYRINGE | INTRAMUSCULAR | Status: DC
  Administered 2023-12-16 – 2023-12-21 (×6): 40 mg via SUBCUTANEOUS
  Filled 2023-12-16 (×6): qty 0.4

## 2023-12-16 NOTE — Progress Notes (Signed)
       Overnight   NAME: Melinda Calderon MRN: [id number] DOB : 2001-01-27    Date of Service   12/16/2023   HPI/Events of Note    Notified by RN for episode comparable to earlier possible seizure episodes noted by admitting physician and ER physician. Episode lasted  2 minutes with return to previous state as noted by admitting physician. Patient is oriented to self and year. Which is  equivalent of prior  Interventions/ Plan   Continue seizure precautions AM labs pending Fall Precautions      Denece Finger BSN MSNA MSN ACNPC-AG Acute Care Nurse Practitioner [facility]

## 2023-12-16 NOTE — ED Notes (Signed)
 Nursing-Ambulate in hall Pt successfully ambulated in down hall with a balanced steady gait. Pt reports right leg pain and lightheadedness while ambulating.

## 2023-12-16 NOTE — Procedures (Signed)
 Patient Name: Melinda Calderon  MRN: [id number]  Epilepsy Attending: Arleene Lack  Referring Physician/Provider: Willadean Hark, MD  Date: 12/16/2023 Duration: 24.55 mins  Patient history: 23yo F with seizure like activity getting eeg to evaluate for seizure  Level of alertness: Awake, asleep  AEDs during EEG study: LEV, LTG, Fycompa   Technical aspects: This EEG study was done with scalp electrodes positioned according to the 10-20 International system of electrode placement. Electrical activity was reviewed with band pass filter of 1-70Hz , sensitivity of 7 uV/mm, display speed of 78mm/sec with a 60Hz  notched filter applied as appropriate. EEG data were recorded continuously and digitally stored.  Video monitoring was available and reviewed as appropriate.  Description: The posterior dominant rhythm consists of 9-10 Hz activity of moderate voltage (25-35 uV) seen predominantly in posterior head regions, symmetric and reactive to eye opening and eye closing. Sleep was characterized by vertex waves, sleep spindles (12 to 14 Hz), maximal frontocentral region.  Physiologic photic driving was not seen during photic stimulation.  Hyperventilation was not performed.     IMPRESSION: This study is within normal limits. No seizures or epileptiform discharges were seen throughout the recording.  A normal interictal EEG does not exclude the diagnosis of epilepsy.  [name] O [name]

## 2023-12-16 NOTE — Progress Notes (Signed)
 EEG complete - results pending

## 2023-12-16 NOTE — Progress Notes (Signed)
 [facility] PROGRESS NOTE   Melinda Calderon UJW:[id number] DOB: 2000-12-03 DOA: 12/15/2023  PCP: Brendan Call, MD  Brief History: 23 y.o. 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  History was provided by patient's mother.  Apparently patient's mother found her difficult to arouse and had noted that she had urinated on herself.  Apparently had a seizure activity with some more incontinent of urine.  She was brought into the hospital after EMS was called.   Consultants: None yet  Procedures: EEG is pending    Subjective/Interval History: Patient is awake with somewhat distracted.  Denies any headaches currently.  Some nausea overnight but none currently.  Denies any abdominal pain.    Assessment/Plan:  Seizure activity with concern for pseudoseizures Prior to admission patient is supposed to be on Keppra , Lamictal  and perampanel .  Apparently she ran out of her medications.  She was loaded with Keppra  in the emergency department.  Noted to be on all 3 of the above-mentioned medications at this time in the hospital.  EEG is pending.  Nausea and vomiting Etiology unclear.  Abdomen is benign on examination.  Pregnancy test was negative.  No dysuria mention.  WBC is normal.  Challenge with diet today to see how she does.  Mobilize. Supplement potassium.  Magnesium  2.2.  DVT Prophylaxis: Initiate Lovenox  Code Status: Full code Family Communication: No family at bedside Disposition Plan: Hopefully home later today or tomorrow  Status is: Observation The patient remains OBS appropriate and may or may not d/c before 2 midnights.      Medications: Scheduled:  lamoTRIgine   100 mg Oral BID   levETIRAcetam   1,500 mg Oral BID   perampanel   4 mg Oral QHS   potassium chloride   40 mEq Oral Once   Continuous: PRN:  Antibiotics: Anti-infectives (From admission, onward)    None       Objective:  Vital  Signs  Vitals:   12/16/23 0501 12/16/23 0518 12/16/23 0800 12/16/23 0928  BP: 108/77  119/85   Pulse: 99  81   Resp: 18  19   Temp:  (!) 97.3 F (36.3 C)  98.1 F (36.7 C)  TempSrc:  Oral  Oral  SpO2: 99%  98%     Intake/Output Summary (Last 24 hours) at 12/16/2023 0941 Last data filed at 12/15/2023 2019 Gross per 24 hour  Intake 200 ml  Output --  Net 200 ml   There were no vitals filed for this visit.  General appearance: Awake alert.  In no distress Resp: Clear to auscultation bilaterally.  Normal effort Cardio: S1-S2 is normal regular.  No S3-S4.  No rubs murmurs or bruit GI: Abdomen is soft.  Nontender nondistended.  Bowel sounds are present normal.  No masses organomegaly Extremities: No edema.  Full range of motion of lower extremities. Neurologic: Alert and oriented x3.  No focal neurological deficits.    Lab Results:  Data Reviewed: I have personally reviewed following labs and reports of the imaging studies  CBC: Recent Labs  Lab 12/15/23 1906 12/16/23 0400  WBC 5.2 7.6  NEUTROABS 2.3  --   HGB 13.8 12.9  HCT 42.0 38.4  MCV 98.4 97.2  PLT 277 283    Basic Metabolic Panel: Recent Labs  Lab 12/15/23 1906 12/16/23 0400  NA 136 134*  K 3.9 3.5  CL 104 106  CO2 20* 18*  GLUCOSE 88 92  BUN 10 9  CREATININE 0.72 0.56  CALCIUM 9.3 9.2  MG 2.0 2.2    GFR: CrCl cannot be calculated (Unknown ideal weight.).  Liver Function Tests: Recent Labs  Lab 12/15/23 1906 12/16/23 0400  AST 20 15  ALT 19 18  ALKPHOS 49 46  BILITOT 0.5 0.6  PROT 7.6 6.8  ALBUMIN 4.8 4.3     CBG: Recent Labs  Lab 12/15/23 1933  GLUCAP 88     Radiology Studies: CT Head Wo Contrast Result Date: 12/15/2023 CLINICAL DATA:  Mental status change, unknown cause repeated seizures, nausea/vomiting, altered speech EXAM: CT HEAD WITHOUT CONTRAST TECHNIQUE: Contiguous axial images were obtained from the base of the skull through the vertex without intravenous contrast.  RADIATION DOSE REDUCTION: This exam was performed according to the departmental dose-optimization program which includes automated exposure control, adjustment of the mA and/or kV according to patient size and/or use of iterative reconstruction technique. COMPARISON:  Head CT 06/20/2023 FINDINGS: Brain: No hemorrhage. No hydrocephalus. No extra-axial fluid collection no CT evidence of an acute cortical infarct. No mass effect. No mass lesion. Vascular: No hyperdense vessel or unexpected calcification. Skull: Normal. Negative for fracture or focal lesion. Sinuses/Orbits: No middle ear effusion paranasal small right mastoid effusion. Paranasal sinuses are clear. Orbits are unremarkable. Other: None. IMPRESSION: No CT etiology for altered mental status or seizures identified. Electronically Signed   By: Clora Dane M.D.   On: 12/15/2023 20:46       LOS: 0 days   Suann Klier Lyndon Santiago  [facility] Pager on [email or web address]  12/16/2023, 9:41 AM

## 2023-12-17 ENCOUNTER — Other Ambulatory Visit (HOSPITAL_COMMUNITY): Payer: Self-pay

## 2023-12-17 DIAGNOSIS — R569 Unspecified convulsions: Secondary | ICD-10-CM | POA: Diagnosis not present

## 2023-12-17 DIAGNOSIS — R112 Nausea with vomiting, unspecified: Secondary | ICD-10-CM | POA: Diagnosis not present

## 2023-12-17 LAB — COMPREHENSIVE METABOLIC PANEL
ALT: 17 U/L (ref 0–44)
AST: 23 U/L (ref 15–41)
Albumin: 4.3 g/dL (ref 3.5–5.0)
Alkaline Phosphatase: 46 U/L (ref 38–126)
Anion gap: 9 (ref 5–15)
BUN: 12 mg/dL (ref 6–20)
CO2: 25 mmol/L (ref 22–32)
Calcium: 9.2 mg/dL (ref 8.9–10.3)
Chloride: 102 mmol/L (ref 98–111)
Creatinine, Ser: 0.69 mg/dL (ref 0.44–1.00)
GFR, Estimated: >60 mL/min (ref >60)
Glucose, Bld: 103 mg/dL — ABNORMAL HIGH (ref 70–99)
Potassium: 3.6 mmol/L (ref 3.5–5.1)
Sodium: 136 mmol/L (ref 135–145)
Total Bilirubin: 0.8 mg/dL (ref 0.0–1.2)
Total Protein: 6.9 g/dL (ref 6.5–8.1)

## 2023-12-17 LAB — MAGNESIUM: Magnesium: 2.1 mg/dL (ref 1.7–2.4)

## 2023-12-17 LAB — CBC
HCT: 38.7 % (ref 36.0–46.0)
Hemoglobin: 13.1 g/dL (ref 12.0–15.0)
MCH: 32.2 pg (ref 26.0–34.0)
MCHC: 33.9 g/dL (ref 30.0–36.0)
MCV: 95.1 fL (ref 80.0–100.0)
Platelets: 298 10*3/uL (ref 150–400)
RBC: 4.07 MIL/uL (ref 3.87–5.11)
RDW: 13.1 % (ref 11.5–15.5)
WBC: 7.4 10*3/uL (ref 4.0–10.5)
nRBC: 0 % (ref 0.0–0.2)

## 2023-12-17 MED ORDER — LORAZEPAM 2 MG/ML IJ SOLN
1.0000 mg | INTRAMUSCULAR | Status: DC | PRN
  Administered 2023-12-17: 1 mg via INTRAVENOUS
  Administered 2023-12-19: 2 mg via INTRAVENOUS
  Filled 2023-12-17 (×2): qty 1

## 2023-12-17 MED ORDER — LEVETIRACETAM 750 MG PO TABS
1500.0000 mg | ORAL_TABLET | Freq: Two times a day (BID) | ORAL | 3 refills | Status: DC
  Filled 2023-12-17: qty 90, 23d supply, fill #0
  Filled 2023-12-17: qty 30, 7d supply, fill #0

## 2023-12-17 MED ORDER — CEFADROXIL 500 MG PO CAPS
500.0000 mg | ORAL_CAPSULE | Freq: Two times a day (BID) | ORAL | 0 refills | Status: AC
  Filled 2023-12-17 – 2023-12-22 (×2): qty 10, 5d supply, fill #0

## 2023-12-17 MED ORDER — FYCOMPA 4 MG PO TABS
ORAL_TABLET | ORAL | 5 refills | Status: DC
  Filled 2023-12-17: qty 30, 30d supply, fill #0

## 2023-12-17 MED ORDER — LAMOTRIGINE 25 MG PO TABS
ORAL_TABLET | ORAL | 0 refills | Status: DC
  Filled 2023-12-17: qty 60, 32d supply, fill #0

## 2023-12-17 MED ORDER — LAMOTRIGINE 100 MG PO TABS
200.0000 mg | ORAL_TABLET | Freq: Two times a day (BID) | ORAL | 3 refills | Status: DC
  Filled 2023-12-17: qty 120, 30d supply, fill #0

## 2023-12-17 NOTE — Progress Notes (Signed)
Assuming care for this patient from off-going nurse until 1900. Agree w/ previously charted documentation and shift assessment.

## 2023-12-17 NOTE — Plan of Care (Signed)
  Problem: Coping: Goal: Level of anxiety will decrease Outcome: Progressing   Problem: Pain Managment: Goal: General experience of comfort will improve and/or be controlled Outcome: Progressing   Problem: Safety: Goal: Ability to remain free from injury will improve Outcome: Progressing   Problem: Skin Integrity: Goal: Risk for impaired skin integrity will decrease Outcome: Progressing

## 2023-12-17 NOTE — Progress Notes (Signed)
[facility] PROGRESS NOTE   Melinda Calderon ZOX:[id number] DOB: Oct 01, 2001 DOA: 12/15/2023  PCP: Christel Mormon, MD  Brief History: 23 y.o. 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  History was provided by patient's mother.  Apparently patient's mother found her difficult to arouse and had noted that she had urinated on herself.  Apparently had a seizure activity with some more incontinent of urine.  She was brought into the hospital after EMS was called.   Consultants: None yet  Procedures: EEG i    Subjective/Interval History: Patient mentioned this morning that she was feeling better.  No seizure activity reported.  She was told about her negative EEG finding.  Plan was for discharge home however when nursing was ready to start the discharge paperwork patient had 2 back-to-back seizures.      Assessment/Plan:  Seizure activity with concern for pseudoseizures CT head did not show any acute findings.   Prior to admission patient is supposed to be on Keppra, Lamictal and perampanel.  Apparently she ran out of her medications.  Unclear when exactly she ran out of her medications.  She was loaded with Keppra in the emergency department.  Noted to be on all 3 of the above-mentioned medications at this time in the hospital.  EEG did not show any epileptiform activity. Initial plan was for discharge today however patient had 2 seizures this morning again.  Discussed with Dr. Amada Jupiter with neurology who will consult on this patient.  May need to be transferred to [facility] for LTM but he will determine that after he has seen her. Will repeat labs today.  Nausea and vomiting Etiology unclear.  Abdomen is benign on examination.  Pregnancy test was negative.   Challenged with diet.  Symptoms have resolved.  Abnormal UA/UTI Does mention frequent urination.  Continue with ceftriaxone for now.  DVT Prophylaxis:  Lovenox Code Status: Full code Family Communication: No family at bedside Disposition Plan: To be determined   Medications: Scheduled:  enoxaparin (LOVENOX) injection  40 mg Subcutaneous Q24H   lamoTRIgine  100 mg Oral BID   levETIRAcetam  1,500 mg Oral BID   perampanel  4 mg Oral QHS   Continuous:  cefTRIAXone (ROCEPHIN)  IV 1 g (12/16/23 1808)   PRN:  Antibiotics: Anti-infectives (From admission, onward)    Start     Dose/Rate Route Frequency Ordered Stop   12/17/23 0000  cefadroxil (DURICEF) 500 MG capsule        500 mg Oral 2 times daily 12/17/23 0859 12/22/23 2359   12/16/23 1700  cefTRIAXone (ROCEPHIN) 1 g in sodium chloride 0.9 % 100 mL IVPB        1 g 200 mL/hr over 30 Minutes Intravenous Every 24 hours 12/16/23 1622         Objective:  Vital Signs  Vitals:   12/16/23 1400 12/16/23 1554 12/16/23 1959 12/17/23 0335  BP: 137/87 128/81 105/86 113/68  Pulse: (!) 105 98 98 93  Resp: 15 17 20 18   Temp:  98.2 F (36.8 C) 97.8 F (36.6 C) 97.6 F (36.4 C)  TempSrc:  Oral Oral Oral  SpO2: 99% 100% 100% 100%  Weight:  59 kg    Height:  5' (1.524 m)      Intake/Output Summary (Last 24 hours) at 12/17/2023 1207 Last data filed at 12/17/2023 1000 Gross per 24 hour  Intake 460 ml  Output 2 ml  Net 458 ml  Filed Weights   12/16/23 1222 12/16/23 1554  Weight: 54.3 kg 59 kg    General appearance: Awake alert.  In no distress Resp: Clear to auscultation bilaterally.  Normal effort Cardio: S1-S2 is normal regular.  No S3-S4.  No rubs murmurs or bruit GI: Abdomen is soft.  Nontender nondistended.  Bowel sounds are present normal.  No masses organomegaly Extremities: No edema.  Full range of motion of lower extremities. Neurologic: Alert and oriented x3.  No focal neurological deficits.     Lab Results:  Data Reviewed: I have personally reviewed following labs and reports of the imaging studies  CBC: Recent Labs  Lab 12/15/23 1906 12/16/23 0400  WBC  5.2 7.6  NEUTROABS 2.3  --   HGB 13.8 12.9  HCT 42.0 38.4  MCV 98.4 97.2  PLT 277 283    Basic Metabolic Panel: Recent Labs  Lab 12/15/23 1906 12/16/23 0400  NA 136 134*  K 3.9 3.5  CL 104 106  CO2 20* 18*  GLUCOSE 88 92  BUN 10 9  CREATININE 0.72 0.56  CALCIUM 9.3 9.2  MG 2.0 2.2    GFR: Estimated Creatinine Clearance: 86.4 mL/min (by C-G formula based on SCr of 0.56 mg/dL).  Liver Function Tests: Recent Labs  Lab 12/15/23 1906 12/16/23 0400  AST 20 15  ALT 19 18  ALKPHOS 49 46  BILITOT 0.5 0.6  PROT 7.6 6.8  ALBUMIN 4.8 4.3     CBG: Recent Labs  Lab 12/15/23 1933  GLUCAP 88     Radiology Studies: EEG adult Result Date: 12/16/2023 Charlsie Quest, MD     12/16/2023  7:01 PM Patient Name: Melinda Calderon MRN: [id number] Epilepsy Attending: Charlsie Quest Referring Physician/Provider: Buena Irish, MD Date: 12/16/2023 Duration: 24.55 mins Patient history: 23yo F with seizure like activity getting eeg to evaluate for seizure Level of alertness: Awake, asleep AEDs during EEG study: LEV, LTG, Fycompa Technical aspects: This EEG study was done with scalp electrodes positioned according to the 10-20 International system of electrode placement. Electrical activity was reviewed with band pass filter of 1-70Hz , sensitivity of 7 uV/mm, display speed of 4mm/sec with a 60Hz  notched filter applied as appropriate. EEG data were recorded continuously and digitally stored.  Video monitoring was available and reviewed as appropriate. Description: The posterior dominant rhythm consists of 9-10 Hz activity of moderate voltage (25-35 uV) seen predominantly in posterior head regions, symmetric and reactive to eye opening and eye closing. Sleep was characterized by vertex waves, sleep spindles (12 to 14 Hz), maximal frontocentral region.  Physiologic photic driving was not seen during photic stimulation.  Hyperventilation was not performed.   IMPRESSION: This study is within  normal limits. No seizures or epileptiform discharges were seen throughout the recording. A normal interictal EEG does not exclude the diagnosis of epilepsy. Charlsie Quest   CT Head Wo Contrast Result Date: 12/15/2023 CLINICAL DATA:  Mental status change, unknown cause repeated seizures, nausea/vomiting, altered speech EXAM: CT HEAD WITHOUT CONTRAST TECHNIQUE: Contiguous axial images were obtained from the base of the skull through the vertex without intravenous contrast. RADIATION DOSE REDUCTION: This exam was performed according to the departmental dose-optimization program which includes automated exposure control, adjustment of the mA and/or kV according to patient size and/or use of iterative reconstruction technique. COMPARISON:  Head CT 06/20/2023 FINDINGS: Brain: No hemorrhage. No hydrocephalus. No extra-axial fluid collection no CT evidence of an acute cortical infarct. No mass effect. No mass lesion. Vascular: No hyperdense  vessel or unexpected calcification. Skull: Normal. Negative for fracture or focal lesion. Sinuses/Orbits: No middle ear effusion paranasal small right mastoid effusion. Paranasal sinuses are clear. Orbits are unremarkable. Other: None. IMPRESSION: No CT etiology for altered mental status or seizures identified. Electronically Signed   By: Lorenza Cambridge M.D.   On: 12/15/2023 20:46       LOS: 1 day   Osvaldo Shipper  [facility] Pager on [email or web address]  12/17/2023, 12:07 PM

## 2023-12-17 NOTE — TOC Initial Note (Signed)
Transition of Care [facility]) - Initial/Assessment Note    Patient Details  Name: Melinda Calderon MRN: [id number] Date of Birth: 01/04/2001  [facility]) CM/SW Contact:    Lanier Clam, RN Phone Number: 12/17/2023, 12:56 PM  Clinical Narrative:  Referral for meds asst-has health insurance w/obligated co pay for meds. Not appropriate for med asst. Continue to monitor for d/c plans.                 Expected Discharge Plan: Home/Self Care Barriers to Discharge: Continued Medical Work up   Patient Goals and CMS Choice            Expected Discharge Plan and Services         Expected Discharge Date: 12/17/23                                    Prior Living Arrangements/Services                       Activities of Daily Living   ADL Screening (condition at time of admission) Independently performs ADLs?: Yes (appropriate for developmental age) Is the patient deaf or have difficulty hearing?: No Does the patient have difficulty seeing, even when wearing glasses/contacts?: No Does the patient have difficulty concentrating, remembering, or making decisions?: No  Permission Sought/Granted                  Emotional Assessment              Admission diagnosis:  Seizures (HCC) [R56.9] Seizure-like activity (HCC) [R56.9] Nausea and vomiting, unspecified vomiting type [R11.2] Seizure (HCC) [R56.9] Patient Active Problem List   Diagnosis Date Noted   Seizures (HCC) 12/15/2023   Intractable nausea and vomiting 10/28/2023   Hypophosphatemia 10/28/2023   Hypokalemia 10/28/2023   Arthralgia 08/19/2019   Low back pain 08/19/2019   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   Probable non-epileptic seizure events 07/03/2016   Chronic laryngitis 01/21/2016   Panic disorder 10/23/2015   Difficulty controlling anger 09/13/2015   Dysphonia 09/12/2015   Episodic tension-type headache, not intractable 02/15/2015   Syncope and collapse 02/15/2015    Migraine without aura 02/15/2015   Lack of adequate sleep 02/15/2015   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 [place] [address] Phone: [phone] Fax: [phone]     Social Drivers of Health (SDOH) Social History: SDOH Screenings   Food Insecurity: Food Insecurity Present (12/16/2023)  Housing: Low Risk  (12/16/2023)  Transportation Needs: Unmet Transportation Needs (12/16/2023)  Utilities: Not At Risk (12/16/2023)  Recent Concern: Utilities - At Risk (10/28/2023)  Social Connections: Unknown (10/03/2023)   Received from [facility]  Tobacco Use: Low Risk  (12/15/2023)   SDOH Interventions:     Readmission Risk Interventions     No data to display

## 2023-12-17 NOTE — Progress Notes (Addendum)
Patient scheduled to DC home, was talking to patient about process and had just taken tele off.  Patient said "I feel like Im about to have a seizure" Patient rolled to side and continued to speak but then suddenly began to have seizure like activity which last for approx 45 seconds.  Afterwards patient groggy but answering questions.  About 1 minute after that patient began convulsing again - tele had been reapplied and noted HR in 140s.  This seizure lasted longer, approx 1.5 minutes and small amount of saliva came from mouth.  Suction used, O2 sats remained in 90s.  Patient now lethargic but awake MD aware and came to bedside,  PRN ativan ordered and administered,  VS remain WNL.

## 2023-12-17 NOTE — Consult Note (Signed)
NEUROLOGY CONSULT NOTE   Date of service: December 17, 2023 Patient Name: Melinda Calderon MRN:  [id number] DOB:  June 30, 2001 Chief Complaint: "Seizures" Requesting Provider: Osvaldo Shipper, MD  History of Present Illness  Melinda Calderon is a 22 y.o. female past medical history of seizures and PNES, anxiety, noncompliance to medication, presented to the hospital for evaluation of seizures when Melinda Calderon was found by her mother somewhat unresponsive, difficult to arouse and having had urinated herself.  Melinda Calderon did have a witnessed seizure activity.  Melinda Calderon was unable to provide good history today as Melinda Calderon recently had a seizure and was postictal.  Melinda Calderon does say that Melinda Calderon is compliant to medications but the chart review reveals multiple no-shows and history of noncompliance to medications. Unable to gather much history from the patient at this time.  Melinda Calderon has been followed by periodic neurology up until 2021 and then transitioned her care to adult neurologist Dr. Patrcia Dolly.  Last visit with the neurologist was December 2023 and has since not been able to make the visit for 1 reason or the other-mainly transpiration related issues. Plan was to refer her to EMU for spell characterization. Current antiepileptics Keppra 1500 twice daily, lamotrigine 200 twice daily and Fycompa 4 mg at bedtime.  Melinda Calderon was evaluated in the hospital, started back on her AEDs and was getting ready for discharge when Melinda Calderon had another seizure today.    ROS  Unable to reliably gather due to her mentation  Past History   Past Medical History:  Diagnosis Date   Anxiety    GERD (gastroesophageal reflux disease)    Seizures (HCC)     History reviewed. No pertinent surgical history.  Family History: Family History  Problem Relation Age of Onset   Hypertension Mother    Cancer Maternal Grandmother        Died at 74   Cancer Paternal Grandmother        Died at 26   Epilepsy Maternal Grandfather    Cirrhosis Maternal  Grandfather        Died at 6   Seizures Paternal Grandfather        Died at 52   Epilepsy Paternal Aunt     Social History  reports that Melinda Calderon has never smoked. Melinda Calderon has never used smokeless tobacco. Melinda Calderon reports current alcohol use. Melinda Calderon reports that Melinda Calderon does not use drugs.  No Known Allergies  Medications   Current Facility-Administered Medications:    cefTRIAXone (ROCEPHIN) 1 g in sodium chloride 0.9 % 100 mL IVPB, 1 g, Intravenous, Q24H, Osvaldo Shipper, MD, Last Rate: 200 mL/hr at 12/16/23 1808, 1 g at 12/16/23 1808   enoxaparin (LOVENOX) injection 40 mg, 40 mg, Subcutaneous, Q24H, Osvaldo Shipper, MD, 40 mg at 12/17/23 1215   lamoTRIgine (LAMICTAL) tablet 100 mg, 100 mg, Oral, BID, Buena Irish, MD, 100 mg at 12/17/23 1010   levETIRAcetam (KEPPRA) tablet 1,500 mg, 1,500 mg, Oral, BID, Buena Irish, MD, 1,500 mg at 12/17/23 1010   LORazepam (ATIVAN) injection 1 mg, 1 mg, Intravenous, Q4H PRN, Osvaldo Shipper, MD, 1 mg at 12/17/23 1213   Oral care mouth rinse, 15 mL, Mouth Rinse, PRN, Osvaldo Shipper, MD   perampanel [facility] tablet 4 mg, 4 mg, Oral, QHS, Buena Irish, MD, 4 mg at 12/17/23 0036  Vitals   Vitals:   12/16/23 1959 12/17/23 0335 12/17/23 1213 12/17/23 1225  BP: 105/86 113/68 (!) 139/99 (!) 135/96  Pulse: 98 93    Resp: 20 18  Temp: 97.8 F (36.6 C) 97.6 F (36.4 C)  97.8 F (36.6 C)  TempSrc: Oral Oral  Oral  SpO2: 100% 100%  99%  Weight:      Height:        Body mass index is 25.39 kg/m.  Physical Exam   General: Drowsy, no Distress HNT: Normocephalic/atraumatic Chest clear Cardiovascular: Regular rhythm Neurologic exam Drowsy, no apparent distress. Answer some questions but has extremely poor attention concentration Unable to provide reliable history No gross aphasia or dysarthria Cranial nerves II to XII intact Motor examination with some volitional looking action tremor in the right upper extremity but otherwise  unremarkable-symmetric at least 4+/5 strength in all 4 extremities. Sensation diminished to light touch in all 4 extremities and on the face symmetrically on both sides-reports Melinda Calderon does not feel the touch as Melinda Calderon is supposed to. Coordination difficult to assess given her mentation  Labs/Imaging/Neurodiagnostic studies   CBC:  Recent Labs  Lab 2023-12-30 1906 12/16/23 0400  WBC 5.2 7.6  NEUTROABS 2.3  --   HGB 13.8 12.9  HCT 42.0 38.4  MCV 98.4 97.2  PLT 277 283   Basic Metabolic Panel:  Lab Results  Component Value Date   NA 134 (L) 12/16/2023   K 3.5 12/16/2023   CO2 18 (L) 12/16/2023   GLUCOSE 92 12/16/2023   BUN 9 12/16/2023   CREATININE 0.56 12/16/2023   CALCIUM 9.2 12/16/2023   GFRNONAA >60 12/16/2023   GFRAA 137 01/06/2021   Lipid Panel: No results found for: "LDLCALC" HgbA1c: No results found for: "HGBA1C" Urine Drug Screen:     Component Value Date/Time   LABOPIA NONE DETECTED 12/16/2023 1331   COCAINSCRNUR NONE DETECTED 12/16/2023 1331   LABBENZ POSITIVE (A) 12/16/2023 1331   AMPHETMU NONE DETECTED 12/16/2023 1331   THCU POSITIVE (A) 12/16/2023 1331   LABBARB NONE DETECTED 12/16/2023 1331    Alcohol Level     Component Value Date/Time   ETH <10 2023/12/30 1907    AED levels:  Lab Results  Component Value Date   LAMOTRIGINE 5.0 05/21/2023    CT Head without contrast(Personally reviewed): No acute intracranial process  Neurodiagnostics rEEG:  Done yesterday-normal  ASSESSMENT   Kearston Kohn Hauter is a 23 y.o. 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  Was started back on her home medications and was getting ready be discharged when Melinda Calderon had another seizure. Appears postictal at this time. Outpatient follow-up and compliance to medications has been questionable. There was plan to refer her to Dr. Melynda Ripple for EMU admission for spell characterization. At  this time, given that Melinda Calderon has had multiple episodes that brought her to the hospital and has multiple episodes at home in spite of her claiming to be on medications and being compliant as well as strong suspicion for anxiety/PNES contributing to a lot of these episodes-it would be prudent to transfer to [facility] for long-term EEG monitoring for spell characterization.  Impression: Breakthrough seizures, epilepsy as well as PNES  RECOMMENDATIONS  Continue her Keppra 1500 twice daily, lamotrigine 200 twice daily and Fycompa 4 mg nightly. Maintain seizure precautions Discussed driving restrictions with her when more lucid. I would recommend transfer to [facility] for LTM EEG for spell characterization. Ativan 1 mg IV for any seizure lasting more than 5 minutes. Plan discussed with Dr. Rito Ehrlich hospitalist at [facility] and neurology team at [facility]. Neurology will  follow ______________________________________________________________________    Dene Gentry, MD [facility]   SEIZURE PRECAUTIONS Per [facility] statutes, patients with seizures are not allowed to drive until they have been seizure-free for six months.   Use caution when using heavy equipment or power tools. Avoid working on ladders or at heights. Take showers instead of baths. Ensure the water temperature is not too high on the home water heater. Do not go swimming alone. Do not lock yourself in a room alone (i.e. bathroom). When caring for infants or small children, sit down when holding, feeding, or changing them to minimize risk of injury to the child in the event you have a seizure. Maintain good sleep hygiene. Avoid alcohol.    If patient has another seizure, call 911 and bring them back to the ED if: A.  The seizure lasts longer than 5 minutes.      B.  The patient doesn't wake shortly after the seizure or has new problems such as difficulty seeing,  speaking or moving following the seizure C.  The patient was injured during the seizure D.  The patient has a temperature over 102 F (39C) E.  The patient vomited during the seizure and now is having trouble breathing

## 2023-12-17 NOTE — Plan of Care (Signed)
°  Problem: Coping: °Goal: Level of anxiety will decrease °Outcome: Progressing °  °

## 2023-12-18 ENCOUNTER — Other Ambulatory Visit (HOSPITAL_COMMUNITY): Payer: Self-pay

## 2023-12-18 DIAGNOSIS — R569 Unspecified convulsions: Secondary | ICD-10-CM | POA: Diagnosis not present

## 2023-12-18 DIAGNOSIS — N3000 Acute cystitis without hematuria: Secondary | ICD-10-CM

## 2023-12-18 LAB — LEVETIRACETAM LEVEL: Levetiracetam Lvl: <2 ug/mL — ABNORMAL LOW (ref 10.0–40.0)

## 2023-12-18 LAB — CBC
HCT: 39.5 % (ref 36.0–46.0)
Hemoglobin: 13.3 g/dL (ref 12.0–15.0)
MCH: 32.8 pg (ref 26.0–34.0)
MCHC: 33.7 g/dL (ref 30.0–36.0)
MCV: 97.3 fL (ref 80.0–100.0)
Platelets: 274 10*3/uL (ref 150–400)
RBC: 4.06 MIL/uL (ref 3.87–5.11)
RDW: 12.8 % (ref 11.5–15.5)
WBC: 7.1 10*3/uL (ref 4.0–10.5)
nRBC: 0 % (ref 0.0–0.2)

## 2023-12-18 LAB — BASIC METABOLIC PANEL
Anion gap: 13 (ref 5–15)
BUN: 13 mg/dL (ref 6–20)
CO2: 22 mmol/L (ref 22–32)
Calcium: 9.4 mg/dL (ref 8.9–10.3)
Chloride: 103 mmol/L (ref 98–111)
Creatinine, Ser: 0.47 mg/dL (ref 0.44–1.00)
GFR, Estimated: >60 mL/min (ref >60)
Glucose, Bld: 95 mg/dL (ref 70–99)
Potassium: 3.6 mmol/L (ref 3.5–5.1)
Sodium: 138 mmol/L (ref 135–145)

## 2023-12-18 MED ORDER — ONDANSETRON HCL 4 MG/2ML IJ SOLN
4.0000 mg | Freq: Four times a day (QID) | INTRAMUSCULAR | Status: DC | PRN
  Administered 2023-12-18: 4 mg via INTRAVENOUS
  Filled 2023-12-18 (×2): qty 2

## 2023-12-18 MED ORDER — POTASSIUM CHLORIDE CRYS ER 20 MEQ PO TBCR
40.0000 meq | EXTENDED_RELEASE_TABLET | Freq: Once | ORAL | Status: AC
  Administered 2023-12-18: 40 meq via ORAL
  Filled 2023-12-18: qty 2

## 2023-12-18 NOTE — Progress Notes (Signed)
Reached out to [employer]. Patient is still waiting on bed at [facility].

## 2023-12-18 NOTE — Plan of Care (Signed)
  Problem: Clinical Measurements: Goal: Diagnostic test results will improve Outcome: Progressing Goal: Respiratory complications will improve Outcome: Progressing Goal: Cardiovascular complication will be avoided Outcome: Progressing   Problem: Coping: Goal: Level of anxiety will decrease Outcome: Progressing   Problem: Safety: Goal: Ability to remain free from injury will improve Outcome: Progressing   Problem: Education: Goal: Expressions of having a comfortable level of knowledge regarding the disease process will increase Outcome: Progressing

## 2023-12-18 NOTE — Progress Notes (Signed)
I saw the patient at [facility], she is awake, alert, reports that she has had no seizures.  There continues to be concern for the character of her spells, and she is awaiting transfer to [facility] for spell characterization.  I would continue her current antiepileptic medication at the current time.  Will continue to follow with you.  Ritta Slot, MD [facility] [phone]  If 7pm- 7am, please page neurology on call as listed in AMION.

## 2023-12-18 NOTE — Progress Notes (Signed)
[facility] PROGRESS NOTE   Melinda Calderon WUJ:[id number] DOB: [birth year] DOA: 12/15/2023  PCP: Christel Mormon, MD  Brief History: 23 y.o. 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  History was provided by patient's mother.  Apparently patient's mother found her difficult to arouse and had noted that she had urinated on herself.  Apparently had a seizure activity with some more incontinent of urine.  She was brought into the hospital after EMS was called.   Consultants: None yet  Procedures: EEG     Subjective/Interval History: No further seizure activity reported.  Patient denies any headaches this morning.  Slept well overnight.     Assessment/Plan:  Seizure activity with concern for pseudoseizures CT head did not show any acute findings.   Prior to admission patient is supposed to be on Keppra, Lamictal and perampanel.  Apparently she ran out of her medications.  Unclear when exactly she ran out of her medications.  She was loaded with Keppra in the emergency department.  Noted to be on all 3 of the above-mentioned medications at this time in the hospital.  EEG did not show any epileptiform activity. Patient was supposed to be discharged on 1/16 but then since was noted to have 2 back-to-back seizure episodes while she was in the discharge process.  Discharge was canceled.  Neurology was consulted.  Plan is for transfer to [facility] for long-term monitoring.  Awaiting bed. In the meantime she continues to be on antiepileptics as mentioned above. Labs are stable.  Supplement potassium.  Nausea and vomiting Etiology unclear.  Abdomen is benign on examination.  Pregnancy test was negative.   Challenged with diet.  Symptoms have resolved.  Abnormal UA/UTI Does mention frequent urination.  Continue with ceftriaxone for now.  DVT Prophylaxis: Lovenox Code Status: Full code Family Communication: No family at  bedside Disposition Plan: Await transfer to [facility] for LTM   Medications: Scheduled:  enoxaparin (LOVENOX) injection  40 mg Subcutaneous Q24H   lamoTRIgine  100 mg Oral BID   levETIRAcetam  1,500 mg Oral BID   perampanel  4 mg Oral QHS   potassium chloride  40 mEq Oral Once   Continuous:  cefTRIAXone (ROCEPHIN)  IV 1 g (12/17/23 1815)   PRN:  Antibiotics: Anti-infectives (From admission, onward)    Start     Dose/Rate Route Frequency Ordered Stop   12/17/23 0000  cefadroxil (DURICEF) 500 MG capsule        500 mg Oral 2 times daily 12/17/23 0859 12/22/23 2359   12/16/23 1700  cefTRIAXone (ROCEPHIN) 1 g in sodium chloride 0.9 % 100 mL IVPB        1 g 200 mL/hr over 30 Minutes Intravenous Every 24 hours 12/16/23 1622         Objective:  Vital Signs  Vitals:   12/17/23 1225 12/17/23 1355 12/17/23 2156 12/18/23 0448  BP: (!) 135/96 133/89 (!) 122/94 118/76  Pulse:  99 100 99  Resp:  16 16 18   Temp: 97.8 F (36.6 C) 98.4 F (36.9 C) 98.1 F (36.7 C) 97.6 F (36.4 C)  TempSrc: Oral Oral Oral Oral  SpO2: 99% 100%  100%  Weight:      Height:        Intake/Output Summary (Last 24 hours) at 12/18/2023 1105 Last data filed at 12/18/2023 0600 Gross per 24 hour  Intake 700 ml  Output --  Net 700 ml   [employer]  12/16/23 1222 12/16/23 1554  Weight: 54.3 kg 59 kg    General appearance: Awake alert.  In no distress Resp: Clear to auscultation bilaterally.  Normal effort Cardio: S1-S2 is normal regular.  No S3-S4.  No rubs murmurs or bruit GI: Abdomen is soft.  Nontender nondistended.  Bowel sounds are present normal.  No masses organomegaly Extremities: No edema.  Full range of motion of lower extremities. Neurologic: Alert and oriented x3.  No focal neurological deficits.   Lab Results:  Data Reviewed: I have personally reviewed following labs and reports of the imaging studies  CBC: Recent Labs  Lab 12/15/23 1906 12/16/23 0400 12/17/23 1312  12/18/23 0422  WBC 5.2 7.6 7.4 7.1  NEUTROABS 2.3  --   --   --   HGB 13.8 12.9 13.1 13.3  HCT 42.0 38.4 38.7 39.5  MCV 98.4 97.2 95.1 97.3  PLT 277 283 298 274    Basic Metabolic Panel: Recent Labs  Lab 12/15/23 1906 12/16/23 0400 12/17/23 1312 12/18/23 0422  NA 136 134* 136 138  K 3.9 3.5 3.6 3.6  CL 104 106 102 103  CO2 20* 18* 25 22  GLUCOSE 88 92 103* 95  BUN 10 9 12 13   CREATININE 0.72 0.56 0.69 0.47  CALCIUM 9.3 9.2 9.2 9.4  MG 2.0 2.2 2.1  --     GFR: Estimated Creatinine Clearance: 86.4 mL/min (by C-G formula based on SCr of 0.47 mg/dL).  Liver Function Tests: Recent Labs  Lab 12/15/23 1906 12/16/23 0400 12/17/23 1312  AST 20 15 23   ALT 19 18 17   ALKPHOS 49 46 46  BILITOT 0.5 0.6 0.8  PROT 7.6 6.8 6.9  ALBUMIN 4.8 4.3 4.3     CBG: Recent Labs  Lab 12/15/23 1933  GLUCAP 88     Radiology Studies: EEG adult Result Date: 12/16/2023 Charlsie Quest, MD     12/16/2023  7:01 PM Patient Name: Melinda Calderon MRN: [id number] Epilepsy Attending: Charlsie Quest Referring Physician/Provider: Buena Irish, MD Date: 12/16/2023 Duration: 24.55 mins Patient history: 23yo F with seizure like activity getting eeg to evaluate for seizure Level of alertness: Awake, asleep AEDs during EEG study: LEV, LTG, Fycompa Technical aspects: This EEG study was done with scalp electrodes positioned according to the 10-20 International system of electrode placement. Electrical activity was reviewed with band pass filter of 1-70Hz , sensitivity of 7 uV/mm, display speed of 34mm/sec with a 60Hz  notched filter applied as appropriate. EEG data were recorded continuously and digitally stored.  Video monitoring was available and reviewed as appropriate. Description: The posterior dominant rhythm consists of 9-10 Hz activity of moderate voltage (25-35 uV) seen predominantly in posterior head regions, symmetric and reactive to eye opening and eye closing. Sleep was characterized by  vertex waves, sleep spindles (12 to 14 Hz), maximal frontocentral region.  Physiologic photic driving was not seen during photic stimulation.  Hyperventilation was not performed.   IMPRESSION: This study is within normal limits. No seizures or epileptiform discharges were seen throughout the recording. A normal interictal EEG does not exclude the diagnosis of epilepsy. Priyanka Annabelle Harman       LOS: 2 days   Tyheem Boughner [employer] Pager on [email or web address]  12/18/2023, 11:05 AM

## 2023-12-19 ENCOUNTER — Inpatient Hospital Stay (HOSPITAL_COMMUNITY): Payer: Medicaid Other | Admitting: Anesthesiology

## 2023-12-19 ENCOUNTER — Inpatient Hospital Stay (HOSPITAL_COMMUNITY): Payer: Medicaid Other

## 2023-12-19 DIAGNOSIS — G40901 Epilepsy, unspecified, not intractable, with status epilepticus: Secondary | ICD-10-CM

## 2023-12-19 DIAGNOSIS — J9601 Acute respiratory failure with hypoxia: Secondary | ICD-10-CM

## 2023-12-19 DIAGNOSIS — R569 Unspecified convulsions: Secondary | ICD-10-CM | POA: Diagnosis not present

## 2023-12-19 LAB — BASIC METABOLIC PANEL
Anion gap: 11 (ref 5–15)
BUN: 10 mg/dL (ref 6–20)
CO2: 24 mmol/L (ref 22–32)
Calcium: 9.4 mg/dL (ref 8.9–10.3)
Chloride: 99 mmol/L (ref 98–111)
Creatinine, Ser: 0.48 mg/dL (ref 0.44–1.00)
GFR, Estimated: >60 mL/min (ref >60)
Glucose, Bld: 94 mg/dL (ref 70–99)
Potassium: 3.8 mmol/L (ref 3.5–5.1)
Sodium: 134 mmol/L — ABNORMAL LOW (ref 135–145)

## 2023-12-19 LAB — COMPREHENSIVE METABOLIC PANEL
ALT: 16 U/L (ref 0–44)
AST: 16 U/L (ref 15–41)
Albumin: 4.5 g/dL (ref 3.5–5.0)
Alkaline Phosphatase: 48 U/L (ref 38–126)
Anion gap: 7 (ref 5–15)
BUN: 10 mg/dL (ref 6–20)
CO2: 26 mmol/L (ref 22–32)
Calcium: 9.4 mg/dL (ref 8.9–10.3)
Chloride: 103 mmol/L (ref 98–111)
Creatinine, Ser: 0.52 mg/dL (ref 0.44–1.00)
GFR, Estimated: >60 mL/min (ref >60)
Glucose, Bld: 107 mg/dL — ABNORMAL HIGH (ref 70–99)
Potassium: 3 mmol/L — ABNORMAL LOW (ref 3.5–5.1)
Sodium: 136 mmol/L (ref 135–145)
Total Bilirubin: 0.5 mg/dL (ref 0.0–1.2)
Total Protein: 7.4 g/dL (ref 6.5–8.1)

## 2023-12-19 LAB — BLOOD GAS, ARTERIAL
Acid-base deficit: 0.2 mmol/L (ref 0.0–2.0)
Bicarbonate: 25.4 mmol/L (ref 20.0–28.0)
Drawn by: 31394
FIO2: 50 %
MECHVT: 360 mL
O2 Saturation: 100 %
PEEP: 15 cmH2O
Patient temperature: 37
RATE: 18 {breaths}/min
pCO2 arterial: 44 mm[Hg] (ref 32–48)
pH, Arterial: 7.37 (ref 7.35–7.45)
pO2, Arterial: 237 mm[Hg] — ABNORMAL HIGH (ref 83–108)

## 2023-12-19 LAB — CBC
HCT: 40.4 % (ref 36.0–46.0)
Hemoglobin: 13.5 g/dL (ref 12.0–15.0)
MCH: 32.1 pg (ref 26.0–34.0)
MCHC: 33.4 g/dL (ref 30.0–36.0)
MCV: 96 fL (ref 80.0–100.0)
Platelets: 301 10*3/uL (ref 150–400)
RBC: 4.21 MIL/uL (ref 3.87–5.11)
RDW: 13 % (ref 11.5–15.5)
WBC: 7.3 10*3/uL (ref 4.0–10.5)
nRBC: 0 % (ref 0.0–0.2)

## 2023-12-19 LAB — GLUCOSE, CAPILLARY: Glucose-Capillary: 104 mg/dL — ABNORMAL HIGH (ref 70–99)

## 2023-12-19 LAB — LACTIC ACID, PLASMA
Lactic Acid, Venous: 0.8 mmol/L (ref 0.5–1.9)
Lactic Acid, Venous: 1.9 mmol/L (ref 0.5–1.9)

## 2023-12-19 LAB — CK: Total CK: 80 U/L (ref 38–234)

## 2023-12-19 LAB — MRSA NEXT GEN BY PCR, NASAL: MRSA by PCR Next Gen: NOT DETECTED

## 2023-12-19 MED ORDER — VALPROATE SODIUM 100 MG/ML IV SOLN
1500.0000 mg | INTRAVENOUS | Status: AC
  Administered 2023-12-19: 1500 mg via INTRAVENOUS
  Filled 2023-12-19: qty 15

## 2023-12-19 MED ORDER — PROPOFOL 1000 MG/100ML IV EMUL
0.0000 ug/kg/min | INTRAVENOUS | Status: DC
  Administered 2023-12-19 – 2023-12-20 (×5): 50 ug/kg/min via INTRAVENOUS
  Filled 2023-12-19 (×6): qty 100

## 2023-12-19 MED ORDER — MIDAZOLAM HCL 2 MG/2ML IJ SOLN
INTRAMUSCULAR | Status: AC
  Administered 2023-12-19: 2 mg
  Filled 2023-12-19: qty 2

## 2023-12-19 MED ORDER — FENTANYL 2500MCG IN NS 250ML (10MCG/ML) PREMIX INFUSION
0.0000 ug/h | INTRAVENOUS | Status: DC
  Administered 2023-12-19: 25 ug/h via INTRAVENOUS
  Filled 2023-12-19: qty 250

## 2023-12-19 MED ORDER — LORAZEPAM 2 MG/ML IJ SOLN
2.0000 mg | Freq: Once | INTRAMUSCULAR | Status: AC
  Administered 2023-12-19: 2 mg via INTRAVENOUS

## 2023-12-19 MED ORDER — FENTANYL BOLUS VIA INFUSION
50.0000 ug | INTRAVENOUS | Status: DC | PRN
Start: 2023-12-19 — End: 2023-12-20
  Administered 2023-12-19: 50 ug via INTRAVENOUS
  Administered 2023-12-19: 100 ug via INTRAVENOUS
  Administered 2023-12-19: 75 ug via INTRAVENOUS
  Administered 2023-12-19: 50 ug via INTRAVENOUS
  Administered 2023-12-19: 100 ug via INTRAVENOUS
  Administered 2023-12-19: 75 ug via INTRAVENOUS

## 2023-12-19 MED ORDER — MIDAZOLAM-SODIUM CHLORIDE 100-0.9 MG/100ML-% IV SOLN
0.5000 mg/h | INTRAVENOUS | Status: DC
  Administered 2023-12-19: 4 mg/h via INTRAVENOUS
  Administered 2023-12-19: 6.5 mg/h via INTRAVENOUS
  Administered 2023-12-20: 7 mg/h via INTRAVENOUS
  Filled 2023-12-19 (×3): qty 100

## 2023-12-19 MED ORDER — FAMOTIDINE 20 MG PO TABS
20.0000 mg | ORAL_TABLET | Freq: Two times a day (BID) | ORAL | Status: DC
  Administered 2023-12-19 – 2023-12-20 (×3): 20 mg
  Filled 2023-12-19 (×4): qty 1

## 2023-12-19 MED ORDER — CHLORHEXIDINE GLUCONATE CLOTH 2 % EX PADS: 6.0000 | MEDICATED_PAD | Freq: Every day | CUTANEOUS | Status: DC

## 2023-12-19 MED ORDER — MIDAZOLAM BOLUS VIA INFUSION
2.0000 mg | INTRAVENOUS | Status: DC | PRN
  Administered 2023-12-19: 2 mg via INTRAVENOUS

## 2023-12-19 MED ORDER — FENTANYL CITRATE PF 50 MCG/ML IJ SOSY
PREFILLED_SYRINGE | INTRAMUSCULAR | Status: AC
  Administered 2023-12-19: 50 ug
  Filled 2023-12-19: qty 2

## 2023-12-19 MED ORDER — PROPOFOL 1000 MG/100ML IV EMUL
INTRAVENOUS | Status: AC
  Administered 2023-12-19: 20 ug/kg/min via INTRAVENOUS
  Filled 2023-12-19: qty 100

## 2023-12-19 MED ORDER — DOCUSATE SODIUM 50 MG/5ML PO LIQD: 100.0000 mg | Freq: Two times a day (BID) | ORAL | Status: DC

## 2023-12-19 MED ORDER — SUCCINYLCHOLINE CHLORIDE 200 MG/10ML IV SOSY
PREFILLED_SYRINGE | INTRAVENOUS | Status: AC
  Administered 2023-12-19: 100 mg via INTRAVENOUS
  Filled 2023-12-19: qty 10

## 2023-12-19 MED ORDER — FENTANYL CITRATE (PF) 100 MCG/2ML IJ SOLN
100.0000 ug | Freq: Once | INTRAMUSCULAR | Status: AC
Start: 2023-12-19 — End: 2023-12-19

## 2023-12-19 MED ORDER — FENTANYL CITRATE PF 50 MCG/ML IJ SOSY
50.0000 ug | PREFILLED_SYRINGE | Freq: Once | INTRAMUSCULAR | Status: AC
Start: 2023-12-19 — End: 2023-12-19

## 2023-12-19 MED ORDER — VALPROATE SODIUM 100 MG/ML IV SOLN
1500.0000 mg | Freq: Once | INTRAVENOUS | Status: DC
  Filled 2023-12-19: qty 15

## 2023-12-19 MED ORDER — CHLORHEXIDINE GLUCONATE CLOTH 2 % EX PADS
6.0000 | MEDICATED_PAD | Freq: Every day | CUTANEOUS | Status: DC
  Administered 2023-12-19 – 2023-12-21 (×3): 6 via TOPICAL

## 2023-12-19 MED ORDER — ORAL CARE MOUTH RINSE
15.0000 mL | OROMUCOSAL | Status: DC
  Administered 2023-12-19 – 2023-12-20 (×7): 15 mL via OROMUCOSAL

## 2023-12-19 MED ORDER — LEVETIRACETAM 100 MG/ML PO SOLN
1500.0000 mg | Freq: Two times a day (BID) | ORAL | Status: DC
  Administered 2023-12-19 – 2023-12-20 (×2): 1500 mg
  Filled 2023-12-19 (×4): qty 15

## 2023-12-19 MED ORDER — PERAMPANEL 2 MG PO TABS
4.0000 mg | ORAL_TABLET | Freq: Every day | ORAL | Status: DC
  Administered 2023-12-19: 4 mg
  Filled 2023-12-19: qty 2

## 2023-12-19 MED ORDER — POTASSIUM CHLORIDE 20 MEQ PO PACK
60.0000 meq | PACK | Freq: Once | ORAL | Status: AC
  Administered 2023-12-19: 60 meq
  Filled 2023-12-19: qty 3

## 2023-12-19 MED ORDER — FENTANYL 2500MCG IN NS 250ML (10MCG/ML) PREMIX INFUSION
50.0000 ug/h | INTRAVENOUS | Status: DC
Start: 2023-12-19 — End: 2023-12-20
  Administered 2023-12-19 – 2023-12-20 (×2): 100 ug/h via INTRAVENOUS
  Filled 2023-12-19: qty 250

## 2023-12-19 MED ORDER — SUCCINYLCHOLINE CHLORIDE 200 MG/10ML IV SOSY: 100.0000 mg | PREFILLED_SYRINGE | Freq: Once | INTRAVENOUS | Status: AC

## 2023-12-19 MED ORDER — ORAL CARE MOUTH RINSE
15.0000 mL | OROMUCOSAL | Status: DC
  Administered 2023-12-19 – 2023-12-20 (×9): 15 mL via OROMUCOSAL

## 2023-12-19 MED ORDER — LORAZEPAM 2 MG/ML IJ SOLN
1.0000 mg | Freq: Once | INTRAMUSCULAR | Status: DC
  Filled 2023-12-19: qty 1

## 2023-12-19 MED ORDER — PROPOFOL 500 MG/50ML IV EMUL
INTRAVENOUS | Status: AC
  Administered 2023-12-19: 100 ug via INTRAVENOUS
  Filled 2023-12-19: qty 50

## 2023-12-19 MED ORDER — POLYETHYLENE GLYCOL 3350 17 G PO PACK: 17.0000 g | PACK | Freq: Every day | ORAL | Status: DC

## 2023-12-19 MED ORDER — POLYETHYLENE GLYCOL 3350 17 G PO PACK
17.0000 g | PACK | Freq: Every day | ORAL | Status: DC
Start: 2023-12-19 — End: 2023-12-20
  Administered 2023-12-19: 17 g
  Filled 2023-12-19: qty 1

## 2023-12-19 MED ORDER — ORAL CARE MOUTH RINSE: 15.0000 mL | OROMUCOSAL | Status: DC | PRN

## 2023-12-19 MED ORDER — DOCUSATE SODIUM 50 MG/5ML PO LIQD
100.0000 mg | Freq: Two times a day (BID) | ORAL | Status: DC
Start: 2023-12-19 — End: 2023-12-20
  Administered 2023-12-19 – 2023-12-20 (×3): 100 mg
  Filled 2023-12-19 (×4): qty 10

## 2023-12-19 MED ORDER — GLYCOPYRROLATE 0.2 MG/ML IJ SOLN
0.1000 mg | Freq: Once | INTRAMUSCULAR | Status: AC
  Administered 2023-12-19: 0.1 mg via INTRAVENOUS
  Filled 2023-12-19: qty 1

## 2023-12-19 MED ORDER — LAMOTRIGINE 100 MG PO TABS
100.0000 mg | ORAL_TABLET | Freq: Two times a day (BID) | ORAL | Status: DC
  Administered 2023-12-19 – 2023-12-20 (×2): 100 mg
  Filled 2023-12-19 (×3): qty 1

## 2023-12-19 MED ORDER — PROPOFOL 10 MG/ML IV BOLUS: 100.0000 ug | Freq: Once | INTRAVENOUS | Status: DC

## 2023-12-19 NOTE — Consult Note (Signed)
NAME:  Melinda Calderon, MRN:  [id number], DOB:  16-Mar-2001, LOS: 3 ADMISSION DATE:  12/15/2023, CONSULTATION DATE:  12/18/22 REFERRING MD:  [facility] CHIEF COMPLAINT:  Seizures   History of Present Illness:  Ibis Steven is a 23 year old woman with seziure disorder who is admitted was admitted for seizures after running out of medications. She was scheduled for discharge 1/16 but then developed recurrent seziures and transferred to step down unit. This morning she developed status epilepticus requiring intubation and sedation. Cerebell evaluation is negative for seizure activity.   PCCM consulted for further management since she was intubated.   Per family, patient was unable to be seen in her neurologists office and ran out of active prescriptions for her medications.   Pertinent  Medical History   Past Medical History:  Diagnosis Date   Anxiety    GERD (gastroesophageal reflux disease)    Seizures (HCC)    Significant Hospital Events: Including procedures, antibiotic start and stop dates in addition to other pertinent events   1/14 admitted for seizures 1/18 intubated due to status epilepticus  Interim History / Subjective:  As above  Objective   Blood pressure 124/74, pulse 87, temperature 98.1 F (36.7 C), temperature source Oral, resp. rate 20, height 5' (1.524 m), weight 59 kg, SpO2 100%.    Vent Mode: PRVC FiO2 (%):  [100 %] 100 % Set Rate:  [18 bmp] 18 bmp Vt Set:  [360 mL] 360 mL PEEP:  [5 cmH20] 5 cmH20 Plateau Pressure:  [32 cmH20] 32 cmH20   Intake/Output Summary (Last 24 hours) at 12/19/2023 0724 Last data filed at 12/18/2023 1100 Gross per 24 hour  Intake 240 ml  Output --  Net 240 ml   Filed Weights   12/16/23 1222 12/16/23 1554  Weight: 54.3 kg 59 kg    Examination: General: young woman, intubated/sedated HENT: [place]/AT, moist mucous membranes Lungs: clear to auscultation, no wheezing Cardiovascular: rrr, no murmurs Abdomen: soft, non-distended,  BS+ Extremities: warm, no edema Neuro: sedated, spontaneously moving all extremities GU: n/a  Resolved Hospital Problem list     Assessment & Plan:  Status Epilepticus - AEDS per neurology, currently on lamotrigine 100mg  BID, keppra 1500mg  BID and given valproate 1500mg  x 1 dose today - on versed and propofol drips - Continue cerebell monitoring - transfer to [facility] to 4N Neuro ICU for cEEG monitoring  Intubated for Airway Protection - continue mechanical ventilatory support - continue versed, propofol and fentanyl for sedation - RASS goal -3  Hypokalemia - replete  Nutrition - will consider TF tomorrow if not extubated  Best Practice (right click and "Reselect all SmartList Selections" daily)   Diet/type: NPO w/ meds via tube DVT prophylaxis SCD Pressure ulcer(s): N/A GI prophylaxis: H2B Lines: N/A Foley:  Yes, and it is still needed Code Status:  full code Last date of multidisciplinary goals of care discussion [1/18 discussed with family]  Labs   CBC: Recent Labs  Lab 12/15/23 1906 12/16/23 0400 12/17/23 1312 12/18/23 0422  WBC 5.2 7.6 7.4 7.1  NEUTROABS 2.3  --   --   --   HGB 13.8 12.9 13.1 13.3  HCT 42.0 38.4 38.7 39.5  MCV 98.4 97.2 95.1 97.3  PLT 277 283 298 274    Basic Metabolic Panel: Recent Labs  Lab 12/15/23 1906 12/16/23 0400 12/17/23 1312 12/18/23 0422 12/19/23 0459  NA 136 134* 136 138 134*  K 3.9 3.5 3.6 3.6 3.8  CL 104 106 102 103 99  CO2 20* 18* 25 22 24   GLUCOSE 88 92 103* 95 94  BUN 10 9 12 13 10   CREATININE 0.72 0.56 0.69 0.47 0.48  CALCIUM 9.3 9.2 9.2 9.4 9.4  MG 2.0 2.2 2.1  --   --    GFR: Estimated Creatinine Clearance: 86.4 mL/min (by C-G formula based on SCr of 0.48 mg/dL). Recent Labs  Lab 12/15/23 1906 12/16/23 0400 12/17/23 1312 12/18/23 0422  WBC 5.2 7.6 7.4 7.1    Liver Function Tests: Recent Labs  Lab 12/15/23 1906 12/16/23 0400 12/17/23 1312  AST 20 15 23   ALT 19 18 17   ALKPHOS 49 46 46  BILITOT  0.5 0.6 0.8  PROT 7.6 6.8 6.9  ALBUMIN 4.8 4.3 4.3   No results for input(s): "LIPASE", "AMYLASE" in the last 168 hours. No results for input(s): "AMMONIA" in the last 168 hours.  ABG    Component Value Date/Time   TCO2 24 01/04/2018 1141     Coagulation Profile: No results for input(s): "INR", "PROTIME" in the last 168 hours.  Cardiac Enzymes: No results for input(s): "CKTOTAL", "CKMB", "CKMBINDEX", "TROPONINI" in the last 168 hours.  HbA1C: No results found for: "HGBA1C"  CBG: Recent Labs  Lab 12/15/23 1933 12/19/23 0556  GLUCAP 88 104*    Review of Systems:   Unable to perform ROS due to intubation/sedation  Past Medical History:  She,  has a past medical history of Anxiety, GERD (gastroesophageal reflux disease), and Seizures (HCC).   Surgical History:  History reviewed. No pertinent surgical history.   Social History:   reports that she has never smoked. She has never used smokeless tobacco. She reports current alcohol use. She reports that she does not use drugs.   Family History:  Her family history includes Cancer in her maternal grandmother and paternal grandmother; Cirrhosis in her maternal grandfather; Epilepsy in her maternal grandfather and paternal aunt; Hypertension in her mother; Seizures in her paternal grandfather.   Allergies No Known Allergies   Home Medications  Prior to Admission medications   Medication Sig Start Date End Date Taking? Authorizing Provider  cefadroxil (DURICEF) 500 MG capsule Take 1 capsule (500 mg total) by mouth 2 (two) times daily for 5 days. 12/17/23 12/22/23 Yes Osvaldo Shipper, MD  ondansetron (ZOFRAN ODT) 4 MG disintegrating tablet Take 1 tablet (4 mg total) by mouth every 8 (eight) hours as needed for nausea or vomiting. 10/29/23  Yes Danford, Earl Lites, MD  FYCOMPA 4 MG TABS Take 1 tablet at bedtime 12/17/23   Osvaldo Shipper, MD  lamoTRIgine (LAMICTAL) 25 MG tablet Take 1 tablet (25 mg total) by mouth daily for 14  days, THEN 2 tablets (50 mg total) daily for 14 days, THEN 4 tablets (100 mg total) daily.  Then follow up with primary care for continuation. 12/17/23 01/19/24  Osvaldo Shipper, MD  levETIRAcetam (KEPPRA) 750 MG tablet Take 2 tablets (1,500 mg total) by mouth 2 (two) times daily. 12/17/23   Osvaldo Shipper, MD  dicyclomine (BENTYL) 20 MG tablet Take 1 tablet (20 mg total) by mouth 2 (two) times daily. 06/28/20 12/31/20  Joy, Shawn C, PA-C  famotidine (PEPCID) 20 MG tablet Take 1 tablet (20 mg total) by mouth 2 (two) times daily. 10/07/20 12/31/20  Gerhard Munch, MD     Critical care time: 40 minutes    Melody Comas, MD [facility] Office: [phone]   See Amion for personal pager PCCM on call pager [phone] until 7pm. Please call  [place] 7p-7a. [phone]

## 2023-12-19 NOTE — Progress Notes (Signed)
LTM EEG running - no initial skin breakdown - push button tested - neuro notified.  

## 2023-12-19 NOTE — Progress Notes (Signed)
NEUROLOGY CONSULT FOLLOW UP NOTE   Date of service: Calderon 18, 2025 Patient Name: Melinda Calderon MRN:  [id number] DOB:  2000/12/11  Interval Hx/subjective   - multiple seizures overnight.  - required intubation for airway protection - Fentanyl, Propofol and Versed gtts - Current AEDs: Lamictal, Keppra, perampanel - Pending transfer to [facility] for LTM EEG  Vitals   Vitals:   12/18/23 2008 12/19/23 0413 12/19/23 0700 12/19/23 0800  BP: 132/72 124/74    Pulse: (!) 101 87    Resp: 20     Temp: 98.3 F (36.8 C) 98.1 F (36.7 C)    TempSrc: Oral Oral    SpO2: 99% 100% 100% 100%  Weight:      Height:         Body mass index is 25.39 kg/m.  Physical Exam   Constitutional: Appears critically ill Eyes: No scleral injection.  HENT: No OP obstrucion. ETT, OG present.  Head: Normocephalic.  Cardiovascular: Tachycardic, Hypertensive Respiratory: Mechanically ventilated.  50%/5/18/360, breathing over vent.  GI: Soft.  No distension. There is no tenderness.  Skin: WDI.   Neurologic Examination   Intubated and sedated. Does not open eyes to voice. With forced eye opening, no gaze preference seen, sluggish pupils.  Positive cough/gag.corneal reflexes.  Does grimace and withdraw in all extremities to mild noxious stimuli.   Medications  Current Facility-Administered Medications:    cefTRIAXone (ROCEPHIN) 1 g in sodium chloride 0.9 % 100 mL IVPB, 1 g, Intravenous, Q24H, Osvaldo Shipper, MD, Stopped at 12/18/23 1844   docusate (COLACE) 50 MG/5ML liquid 100 mg, 100 mg, Per Tube, BID, Martina Sinner, MD   enoxaparin (LOVENOX) injection 40 mg, 40 mg, Subcutaneous, Q24H, Osvaldo Shipper, MD, 40 mg at 12/18/23 1327   famotidine (PEPCID) tablet 20 mg, 20 mg, Per Tube, BID, Luciano Cutter, MD   fentaNYL (SUBLIMAZE) injection 100 mcg, 100 mcg, Intravenous, Once, Sundil, Subrina, MD   lamoTRIgine (LAMICTAL) tablet 100 mg, 100 mg, Oral, BID, Buena Irish, MD, 100 mg at  12/18/23 2139   levETIRAcetam (KEPPRA) tablet 1,500 mg, 1,500 mg, Oral, BID, Buena Irish, MD, 1,500 mg at 12/18/23 2139   LORazepam (ATIVAN) injection 1 mg, 1 mg, Intravenous, Q4H PRN, Osvaldo Shipper, MD, 2 mg at 12/19/23 0534   LORazepam (ATIVAN) injection 1 mg, 1 mg, Intravenous, Once, Anthoney Harada, NP   midazolam (VERSED) 100 mg/100 mL (1 mg/mL) premix infusion, 0.5-10 mg/hr, Intravenous, Continuous, Sundil, Subrina, MD, Last Rate: 4 mL/hr at 12/19/23 0752, 4 mg/hr at 12/19/23 0752   midazolam (VERSED) bolus via infusion 2 mg, 2 mg, Intravenous, Q1H PRN, Janalyn Shy, Subrina, MD, 2 mg at 12/19/23 0723   ondansetron (ZOFRAN) injection 4 mg, 4 mg, Intravenous, Q6H PRN, Osvaldo Shipper, MD, 4 mg at 12/18/23 1327   Oral care mouth rinse, 15 mL, Mouth Rinse, PRN, Osvaldo Shipper, MD   perampanel [facility]) tablet 4 mg, 4 mg, Oral, QHS, Buena Irish, MD, 4 mg at 12/18/23 2139   polyethylene glycol (MIRALAX / GLYCOLAX) packet 17 g, 17 g, Per Tube, Daily, Dewald, Bettina Gavia, MD   propofol (DIPRIVAN) 1000 MG/100ML infusion, 0-50 mcg/kg/min, Intravenous, Continuous, Melody Comas B, MD, Last Rate: 14.16 mL/hr at 12/19/23 0752, 40 mcg/kg/min at 12/19/23 0752   valproate (DEPACON) 1,500 mg in dextrose 5 % 50 mL IVPB, 1,500 mg, Intravenous, STAT, Sundil, Subrina, MD  Labs and Diagnostic Imaging   CBC:  Recent Labs  Lab 12/15/23 1906 12/16/23 0400 12/17/23 1312 12/18/23 0422  WBC 5.2   < >  7.4 7.1  NEUTROABS 2.3  --   --   --   HGB 13.8   < > 13.1 13.3  HCT 42.0   < > 38.7 39.5  MCV 98.4   < > 95.1 97.3  PLT 277   < > 298 274   < > = values in this interval not displayed.    Basic Metabolic Panel:  Lab Results  Component Value Date   NA 134 (L) 12/19/2023   K 3.8 12/19/2023   CO2 24 12/19/2023   GLUCOSE 94 12/19/2023   BUN 10 12/19/2023   CREATININE 0.48 12/19/2023   CALCIUM 9.4 12/19/2023   GFRNONAA >60 12/19/2023   GFRAA 137 01/06/2021   Lipid Panel: No results found  for: "LDLCALC" HgbA1c: No results found for: "HGBA1C" Urine Drug Screen:     Component Value Date/Time   LABOPIA NONE DETECTED 12/16/2023 1331   COCAINSCRNUR NONE DETECTED 12/16/2023 1331   LABBENZ POSITIVE (A) 12/16/2023 1331   AMPHETMU NONE DETECTED 12/16/2023 1331   THCU POSITIVE (A) 12/16/2023 1331   LABBARB NONE DETECTED 12/16/2023 1331    Alcohol Level     Component Value Date/Time   ETH <10 12/15/2023 1907   INR No results found for: "INR" APTT No results found for: "APTT" AED levels:  Lab Results  Component Value Date   LAMOTRIGINE 5.0 05/21/2023   LEVETIRACETA <2.0 (L) 12/15/2023    CT Head without contrast(Personally reviewed): No acute intracranial process   rEEG 1/15:  Within normal limits. No seizures or epileptiform discharges were seen throughout the recording.   Assessment   Melinda Calderon is a 23 y.o. 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  Was started back on her home medications and was getting ready be discharged when she had another seizure.  Plan in place to transfer to [facility] for LTM EEG and spell capture and characterization. Overnight, patient had multiple seizure-like events and was given doses of Ativan and Versed. Her oxygen then dropped to 86% and she required intubation for airway protection.   Recommendations   - Transfer to [facility] for LTM EEG  Cerebell monitoring until able to transfer - Continue her Keppra 1500 twice daily, lamotrigine 200 twice daily and Fycompa 4 mg nightly. - Continue sedation, wean as tolerated.  - Maintain seizure precautions ______________________________________________________________________    Pt seen by Neuro NP/APP and later by MD. Note/plan to be edited by MD as needed.    Melinda January, DNP, AGACNP-BC [facility] Please use AMION for contact information & EPIC for  messaging.   I would continue her antiepileptics as we await transfer over to [facility].  She is currently on ceribell without ongoing seizure activity.  Melinda Slot, MD [facility] [phone]  If 7pm- 7am, please page neurology on call as listed in AMION. This is Dr. Amada Jupiter trying to

## 2023-12-19 NOTE — Plan of Care (Signed)
Plan for continuous EEG, tomorrow will reevaluate need for sedation, intubation, restraints, and foley

## 2023-12-19 NOTE — Progress Notes (Signed)
Care link arrived to transport patient to [facility]. Patient's vital signs stable. Patient given fentanyl bolus and versed increased due to agitation while transferring

## 2023-12-19 NOTE — Significant Event (Addendum)
RAPID RESPONSE NOTE  Rapid response called around 6:30 AM as patient was having back-to-back seizure and frothing from the mouth.  Patient was unable to follow any command.  During my evaluation at the bedside patient was postictal and unable to follow any command and continue to have seizure even with multiple doses of Ativan. Afterward patient gradually become hypoxic O2 sat started dropping to 86% had to do emergent intubation to protect the airway.  Patient maintained blood pressure within good range and remained tachycardic.  Intubation was done in the presence of anesthesia CRNA Glo Herring.  Neurology also has been reevaluated patient recommended valproic acid 1500 mg and if patient need intubation which would be precedence over controlling of the seizure and in that case Versed drip would be appropriate.  Recommended rapid EEG monitoring.  Patient was transferred to ICU for intubation and after intubation patient has been started on fentanyl, propofol and Versed drip.  ICU physician has been updated and taken over for further management and care.   Abbreviated physical exam: Wt Readings from Last 3 Encounters:  12/16/23 59 kg  05/26/23 54.4 kg  05/21/23 54.4 kg   Temp Readings from Last 3 Encounters:  12/19/23 98.2 F (36.8 C) (Axillary)  10/29/23 98.5 F (36.9 C) (Oral)  05/26/23 97.6 F (36.4 C) (Oral)   BP Readings from Last 3 Encounters:  12/19/23 (!) 139/90  10/29/23 113/84  05/26/23 119/76   Pulse Readings from Last 3 Encounters:  12/19/23 (!) 111  10/29/23 (!) 101  05/26/23 (!) 108    Condition of patient: stable Constitutional: Altered mentation Cardiac: normal rate, regular rhythm, normal S1, S2, no murmurs, rubs, clicks or gallops Pulm: clear to auscultation, no wheezes or rales, and unlabored breathing Abd: abdomen is soft without significant tenderness, masses, organomegaly or guarding. Neuro: Patient  is obtunded.  Responds to painful stimuli only. Musc: No swelling, erythema, deformity, atrophy or hypertrophy noted  Orders: ABG Interpretation: Pending.  Basic Metabolic Panel (BMP) - pending, CBC (complete blood count) - pending, and CPK - pending, Actiq acid pending EKG/Telemetry: Sinus rhythm with sinus tachycardia. CXR: normal.  Endotracheal tube 1.5 cm above the hernia.  Condition after Rapid Response: stable  Disposition: Patient needs to be ICU high level of care for further management of status epilepticus.   Primary attending notified: yes  Family notified: yes  Jayro Mcmath 12/19/23 6:52 PM    CRITICAL CARE Performed by: Tereasa Coop, MD     Total critical care time: 75 minutes   Critical care time was exclusive of separately billable procedures and treating other patients.   Critical care was necessary to treat or prevent imminent or life-threatening deterioration.   Critical care was time spent personally by me on the following activities: development of treatment plan with patient and/or surrogate as well as nursing, discussions with consultants, evaluation of patient's response to treatment, examination of patient, obtaining history from patient or surrogate, ordering and performing treatments and interventions, ordering and review of laboratory studies, ordering and review of radiographic studies, pulse oximetry and re-evaluation of patient's condition.

## 2023-12-19 NOTE — Progress Notes (Signed)
Called d/t pt having seizures, found pt to be foaming at the mouth, incontinent and unable to follow commands.  Pt seizures lasting over 10 minutes.  TRIAD, NP at bedside as well orders received and initiated. Pt transferred to ICU for intensive care.

## 2023-12-19 NOTE — Progress Notes (Addendum)
Brief Neuro Note:  I was notified by team that patient is having GTC seizure. Foaming at her mouth, all extremity shaking. She is obtunded mentation and has been given Ativan 4mg . Team is worried about her airway and they reached out for further recommendations for status epilepticus. She has been shaking for over 15 mins now.  Airway should take precedence over seizure control. I have ordered Valproic acid 1500mg  IV once.  Next steps once airway secured is to start her on Versed or propofol gtt.  She needs to be hooked up to Ceribell EEG for now. Would significantly benefit from transfer to [facility] for a cEEG.  Erick Blinks [facility]

## 2023-12-19 NOTE — Procedures (Addendum)
Endotracheal Intubation Procedure Note  PATIENT NAME: Melinda Calderon DOB: 2001-04-17 MRN: [id number] Date of Service: 12/19/2023 Performed by: Tereasa Coop, MD    Procedure: Endotracheal intubation Indication: Hypoxic respiratory failure and unable to maintain the airway in the setting of status epilepticus   Consent: Unable to obtain consent as it was a emergent intubation   Time Out: yes followed by emergent intubation    Anesthetic: Propofol 100 mcg   Paralytic: Succinylcholine 100 mcg  Description of Procedure: A time-out was completed verifying correct patient, procedure, site, positioning, and special equipment if applicable. The patient was placed in a flat position. Sedation was obtained using Propofol and Succinylcholine. The patient was easily ventilated using an ambu bag. The Glidescope was used and inserted into the oropharynx at which time there was a Grade 1 view of the vocal cords. A 7-french endotracheal tube was inserted and visualized going through the vocal cords. The stylette was removed. Colorimetric change was visualized on the CO2 meter. Breath sounds were heard in both lung fields equally. The endotracheal tube was placed at 22 cm, measured at the teeth.  Patients O2 saturation did not drop below 86% and quickly recovered to 100% after reintubation.  A chest x-ray was ordered to assess for pneumothorax and verify endotrachealtube placement. Chest X-Ray Ordered: CXR was ordered which showed endotracheal tube projecting. approximately 1.5cm above the carina. Complications: None. Patient tolerated the procedure well with no immediate complications.   Intubation was performed in the presence and guidance of anesthesia  Glo Herring, CRNA   Tereasa Coop, MD [facility] 12/19/2023, 7:29 AM

## 2023-12-19 NOTE — Procedures (Signed)
Patient Name: OLETA AMSLER  MRN: [id number]  Epilepsy Attending: Charlsie Quest  Referring Physician/Provider: Tereasa Coop, MD  Duration: 12/19/2023 4098 to 1843  Patient history: 23yo F with seizure like activity getting eeg to evaluate for seizure   Level of alertness:  comatose  AEDs during EEG study: Ativan, LTG, LEV, VPA, versed  Technical aspects: This EEG was obtained using a 10 lead EEG system positioned circumferentially without any parasagittal coverage (rapid EEG). Computer selected EEG is reviewed as  well as background features and all clinically significant events.  Description: EEG showed continuous generalized 5  to 6 Hz theta slowing admixed with 15 to 18 Hz beta activity distributed symmetrically and diffusely. Hyperventilation and photic stimulation were not performed.     ABNORMALITY - Continuous slow, generalized - Excessive beta, generalized  IMPRESSION: This limited ceribell EEG is suggestive of severe diffuse encephalopathy, likely related to sedation. No seizures or epileptiform discharges were seen throughout the recording.  Lorianna Spadaccini Annabelle Harman

## 2023-12-19 NOTE — Progress Notes (Signed)
ETT documented as a 7.5@ 24 at the lip. Received patient from EMS with a 7.0@24  at the lip. LDA updated in EPIC at this time.

## 2023-12-19 NOTE — Progress Notes (Signed)
    Patient Name: PRUE SHEHATA           DOB: July 11, 2001  MRN: [id number]      Admission Date: 12/15/2023  Attending Provider: Osvaldo Shipper, MD  Primary Diagnosis: Seizures [facility])   Level of care: Telemetry Medical    CROSS COVER NOTE   Date of Service   12/19/2023   [name], 23 y.o. female, was admitted on 12/15/2023 for Seizures [facility]).    HPI/Events of Note   Rapid response: "multiple seizures"   At bedside, patient is seen having generalized seizures (tonic-clonic).  She is foaming at the mouth, eyes rolled back and rapidly moving.  Incontinent.  Nursing staff have her on her side with suction and oxygen at bedside.  Vital signs stable.   Ativan given, however patient has had 4 seizure episodes.  A total of 4 mg IV Ativan have been given.  At this point patient is obtunded, yet continues to have seizure episodes.   Neurology has been called due to status epilepticus, seizing now for over 15 minutes.  Given change in mental status and persistent seizing, patient requiring intubation to protect airway.  Neurology also recommending valproic acid, and Versed or propofol gtt. to better control seizures.  Neurology has made request for patient to be hooked up to Ceribell EEG.    0630- Dr. Janalyn Shy at bedside coordinating for intubation with anesthesia.    Interventions/ Plan   Transferred to ICU Intubation Valproic, Versed, propofol, fentanyl Ceribell         Anthoney Harada, DNP, [employer]- AG [facility] [place]

## 2023-12-19 NOTE — Progress Notes (Signed)
Received call at 0529 that patient was vomiting. Zofran removed from pyxis at 0531. Upon entering room to administer Zofran, Pt noted shaking and foaming. Pt oxygen was at 100% via monitor. RRT and NP arrived at bedside. First dose ativan given at 0534 with a total of 4 mg given. On call neurology made aware. Pt currently in ICU at this time.

## 2023-12-20 DIAGNOSIS — R569 Unspecified convulsions: Secondary | ICD-10-CM | POA: Diagnosis not present

## 2023-12-20 LAB — BASIC METABOLIC PANEL
Anion gap: 8 (ref 5–15)
BUN: <5 mg/dL — ABNORMAL LOW (ref 6–20)
CO2: 26 mmol/L (ref 22–32)
Calcium: 9.2 mg/dL (ref 8.9–10.3)
Chloride: 101 mmol/L (ref 98–111)
Creatinine, Ser: 0.61 mg/dL (ref 0.44–1.00)
GFR, Estimated: >60 mL/min (ref >60)
Glucose, Bld: 91 mg/dL (ref 70–99)
Potassium: 3.8 mmol/L (ref 3.5–5.1)
Sodium: 135 mmol/L (ref 135–145)

## 2023-12-20 LAB — TRIGLYCERIDES: Triglycerides: 94 mg/dL (ref <150)

## 2023-12-20 MED ORDER — FAMOTIDINE 20 MG PO TABS
20.0000 mg | ORAL_TABLET | Freq: Two times a day (BID) | ORAL | Status: DC
  Administered 2023-12-20 – 2023-12-21 (×2): 20 mg via ORAL
  Filled 2023-12-20: qty 1

## 2023-12-20 MED ORDER — POLYETHYLENE GLYCOL 3350 17 G PO PACK
17.0000 g | PACK | Freq: Every day | ORAL | Status: DC
  Administered 2023-12-22: 17 g via ORAL
  Filled 2023-12-20 (×2): qty 1

## 2023-12-20 MED ORDER — DOCUSATE SODIUM 100 MG PO CAPS
100.0000 mg | ORAL_CAPSULE | Freq: Two times a day (BID) | ORAL | Status: DC
  Administered 2023-12-20 – 2023-12-22 (×4): 100 mg via ORAL
  Filled 2023-12-20 (×4): qty 1

## 2023-12-20 MED ORDER — LEVETIRACETAM 100 MG/ML PO SOLN
1500.0000 mg | Freq: Two times a day (BID) | ORAL | Status: DC
  Administered 2023-12-20: 1500 mg via ORAL
  Filled 2023-12-20: qty 15

## 2023-12-20 MED ORDER — PERAMPANEL 2 MG PO TABS
4.0000 mg | ORAL_TABLET | Freq: Every day | ORAL | Status: DC
  Administered 2023-12-20 – 2023-12-21 (×2): 4 mg via ORAL
  Filled 2023-12-20 (×2): qty 2

## 2023-12-20 MED ORDER — DEXMEDETOMIDINE HCL IN NACL 400 MCG/100ML IV SOLN
0.0000 ug/kg/h | INTRAVENOUS | Status: DC
  Administered 2023-12-20: 0.4 ug/kg/h via INTRAVENOUS
  Filled 2023-12-20: qty 100

## 2023-12-20 MED ORDER — LAMOTRIGINE 100 MG PO TABS
100.0000 mg | ORAL_TABLET | Freq: Two times a day (BID) | ORAL | Status: DC
  Administered 2023-12-20 – 2023-12-21 (×3): 100 mg via ORAL
  Filled 2023-12-20 (×2): qty 1

## 2023-12-20 NOTE — Procedures (Signed)
Patient Name: JENITA GIRVEN  MRN: [id number]  Epilepsy Attending: Charlsie Quest  Referring Physician/Provider: Milon Dikes, MD  Duration: 12/19/2023 2054 to 12/20/2023 2054  Patient history: 23yo f with seizure like activity getting eeg to evaluate for seizure  Level of alertness:  comatose  AEDs during EEG study: versed, propofol, LEV, LTG, perampanel  Technical aspects: This EEG study was done with scalp electrodes positioned according to the 10-20 International system of electrode placement. Electrical activity was reviewed with band pass filter of 1-70Hz , sensitivity of 7 uV/mm, display speed of 14mm/sec with a 60Hz  notched filter applied as appropriate. EEG data were recorded continuously and digitally stored.  Video monitoring was available and reviewed as appropriate.  Description: EEG initially showed continuous generalized 3 to 6 Hz theta-delta slowing admixed with 15 to 18 Hz beta activity distributed symmetrically and diffusely. Gradually after around 1700 on 12/20/2023, sedation was stopped and EEG showed posterior dominant rhythm of 8-9 Hz activity of moderate voltage (25-35 uV) seen predominantly in posterior head regions, symmetric and reactive to eye opening and eye closing. Sleep was characterized by vertex waves, sleep spindles (12 to 14 Hz), maximal frontocentral region. Hyperventilation and photic stimulation were not performed.     ABNORMALITY - Continuous slow, generalized  IMPRESSION: This study was initially suggestive of severe diffuse encephalopathy likely related to sedation.  Gradually at around 1700 on 12/10/2023 as sedation was weaned, EEG improved and was within normal limits.  No seizures or epileptiform discharges were seen throughout the recording.   Pa Tennant Annabelle Harman

## 2023-12-20 NOTE — Progress Notes (Signed)
eLink Physician-Brief Progress Note Patient Name: Melinda Calderon DOB: 02/08/2001 MRN: [id number]   Date of Service  12/20/2023  HPI/Events of Note  extubated today, passed bedside swallow test, asking for clear liq diet  eICU Interventions  Done      Intervention Category Minor Interventions: Routine modifications to care plan (e.g. PRN medications for pain, fever)  Oretha Milch 12/20/2023, 9:08 PM

## 2023-12-20 NOTE — Progress Notes (Signed)
NAME:  Melinda Calderon, MRN:  [id number], DOB:  2001-05-10, LOS: 4 ADMISSION DATE:  12/15/2023, CONSULTATION DATE:  12/18/22 REFERRING MD:  TRH CHIEF COMPLAINT:  Seizures   History of Present Illness:  Melinda Calderon is a 23 year old woman with seziure disorder who is admitted was admitted for seizures after running out of medications. She was scheduled for discharge 1/16 but then developed recurrent seziures and transferred to step down unit. This morning she developed status epilepticus requiring intubation and sedation. Cerebell evaluation is negative for seizure activity.   PCCM consulted for further management since she was intubated.   Per family, patient was unable to be seen in her neurologists office and ran out of active prescriptions for her medications.   Pertinent  Medical History   Past Medical History:  Diagnosis Date   Anxiety    GERD (gastroesophageal reflux disease)    Seizures (HCC)    Significant Hospital Events: Including procedures, antibiotic start and stop dates in addition to other pertinent events   1/14 admitted for seizures 1/18 intubated due to status epilepticus  Interim History / Subjective:  Transferred from [facility] to [facility] 4N ICU in the interim for further evaluation and management of seizure.  On continuous EEG now.  Intubated and sedated.  Neurology following.  Objective   Blood pressure 126/83, pulse (!) 123, temperature 98.9 F (37.2 C), temperature source Oral, resp. rate 18, height 5' (1.524 m), weight 59 kg, SpO2 100%.    Vent Mode: PRVC FiO2 (%):  [30 %-40 %] 40 % Set Rate:  [18 bmp] 18 bmp Vt Set:  [360 mL] 360 mL PEEP:  [5 cmH20] 5 cmH20 Plateau Pressure:  [13 cmH20-16 cmH20] 14 cmH20   Intake/Output Summary (Last 24 hours) at 12/20/2023 0920 Last data filed at 12/20/2023 0900 Gross per 24 hour  Intake 1062.04 ml  Output 850 ml  Net 212.04 ml   Filed Weights   12/16/23 1222 12/16/23 1554  Weight: 54.3 kg 59 kg     Examination: General: young woman, intubated/sedated HENT: [place]/AT, moist mucous membranes Lungs: clear to auscultation, no wheezing Cardiovascular: rrr, no murmurs Abdomen: soft, non-distended, BS+ Extremities: warm, no edema Neuro: sedated, spontaneously moving all extremities GU: n/a  Resolved Hospital Problem list     Assessment & Plan:  Status Epilepticus AEDs per neurology.   Currently on lamotrigine 100 mg BID, Keppra 1500 mg BID.  Also, on Versed and propofol gtts. Got valproate 1500 mg x 1 dose on 1/18.   Neurology input appreciated.  Intubated for Airway Protection continue mechanical ventilatory support continue versed, propofol and fentanyl for sedation RASS goal -3  Hypokalemia replete  Nutrition start tube feed  Best Practice (right click and "Reselect all SmartList Selections" daily)   Diet/type: NPO w/ meds via tube DVT prophylaxis SCD Pressure ulcer(s): N/A GI prophylaxis: H2B Lines: N/A Foley:  Yes, and it is still needed Code Status:  full code Last date of multidisciplinary goals of care discussion [1/18 discussed with family]  Labs   CBC: Recent Labs  Lab 12/15/23 1906 12/16/23 0400 12/17/23 1312 12/18/23 0422 12/19/23 0815  WBC 5.2 7.6 7.4 7.1 7.3  NEUTROABS 2.3  --   --   --   --   HGB 13.8 12.9 13.1 13.3 13.5  HCT 42.0 38.4 38.7 39.5 40.4  MCV 98.4 97.2 95.1 97.3 96.0  PLT 277 283 298 274 301    Basic Metabolic Panel: Recent Labs  Lab 12/15/23 1906 12/16/23 0400 12/17/23  1312 12/18/23 0422 12/19/23 0459 12/19/23 0815 12/20/23 0432  NA 136 134* 136 138 134* 136 135  K 3.9 3.5 3.6 3.6 3.8 3.0* 3.8  CL 104 106 102 103 99 103 101  CO2 20* 18* 25 22 24 26 26   GLUCOSE 88 92 103* 95 94 107* 91  BUN 10 9 12 13 10 10  <5*  CREATININE 0.72 0.56 0.69 0.47 0.48 0.52 0.61  CALCIUM 9.3 9.2 9.2 9.4 9.4 9.4 9.2  MG 2.0 2.2 2.1  --   --   --   --    GFR: Estimated Creatinine Clearance: 86.4 mL/min (by C-G formula based on SCr  of 0.61 mg/dL). Recent Labs  Lab 12/16/23 0400 12/17/23 1312 12/18/23 0422 12/19/23 0815 12/19/23 1023  WBC 7.6 7.4 7.1 7.3  --   LATICACIDVEN  --   --   --  0.8 1.9    Liver Function Tests: Recent Labs  Lab 12/15/23 1906 12/16/23 0400 12/17/23 1312 12/19/23 0815  AST 20 15 23 16   ALT 19 18 17 16   ALKPHOS 49 46 46 48  BILITOT 0.5 0.6 0.8 0.5  PROT 7.6 6.8 6.9 7.4  ALBUMIN 4.8 4.3 4.3 4.5   No results for input(s): "LIPASE", "AMYLASE" in the last 168 hours. No results for input(s): "AMMONIA" in the last 168 hours.  ABG    Component Value Date/Time   PHART 7.37 12/19/2023 0810   PCO2ART 44 12/19/2023 0810   PO2ART 237 (H) 12/19/2023 0810   HCO3 25.4 12/19/2023 0810   TCO2 24 01/04/2018 1141   ACIDBASEDEF 0.2 12/19/2023 0810   O2SAT 100 12/19/2023 0810     Coagulation Profile: No results for input(s): "INR", "PROTIME" in the last 168 hours.  Cardiac Enzymes: Recent Labs  Lab 12/19/23 0815  CKTOTAL 80    HbA1C: No results found for: "HGBA1C"  CBG: Recent Labs  Lab 12/15/23 1933 12/19/23 0556  GLUCAP 88 104*    Review of Systems:   Unable to perform ROS due to intubation/sedation  Past Medical History:  She,  has a past medical history of Anxiety, GERD (gastroesophageal reflux disease), and Seizures (HCC).   Surgical History:  History reviewed. No pertinent surgical history.   Social History:   reports that she has never smoked. She has never used smokeless tobacco. She reports current alcohol use. She reports that she does not use drugs.   Family History:  Her family history includes Cancer in her maternal grandmother and paternal grandmother; Cirrhosis in her maternal grandfather; Epilepsy in her maternal grandfather and paternal aunt; Hypertension in her mother; Seizures in her paternal grandfather.   Allergies No Known Allergies   Home Medications  Prior to Admission medications   Medication Sig Start Date End Date Taking? Authorizing  Provider  cefadroxil (DURICEF) 500 MG capsule Take 1 capsule (500 mg total) by mouth 2 (two) times daily for 5 days. 12/17/23 12/22/23 Yes Osvaldo Shipper, MD  ondansetron (ZOFRAN ODT) 4 MG disintegrating tablet Take 1 tablet (4 mg total) by mouth every 8 (eight) hours as needed for nausea or vomiting. 10/29/23  Yes Danford, Earl Lites, MD  FYCOMPA 4 MG TABS Take 1 tablet at bedtime 12/17/23   Osvaldo Shipper, MD  lamoTRIgine (LAMICTAL) 25 MG tablet Take 1 tablet (25 mg total) by mouth daily for 14 days, THEN 2 tablets (50 mg total) daily for 14 days, THEN 4 tablets (100 mg total) daily.  Then follow up with primary care for continuation. 12/17/23 01/19/24  Rito Ehrlich,  Gokul, MD  levETIRAcetam (KEPPRA) 750 MG tablet Take 2 tablets (1,500 mg total) by mouth 2 (two) times daily. 12/17/23   Osvaldo Shipper, MD  dicyclomine (BENTYL) 20 MG tablet Take 1 tablet (20 mg total) by mouth 2 (two) times daily. 06/28/20 12/31/20  Joy, Shawn C, PA-C  famotidine (PEPCID) 20 MG tablet Take 1 tablet (20 mg total) by mouth 2 (two) times daily. 10/07/20 12/31/20  Gerhard Munch, MD     Critical care time: - minutes   Marcelle Smiling, MD Board Certified by the ABIM, Pulmonary Diseases & Critical Care Medicine  [facility] Office: [phone]  See Amion for personal pager PCCM on call pager [phone] until 7pm. Please call Elink 7p-7a. [phone]

## 2023-12-20 NOTE — Procedures (Signed)
Extubation Procedure Note  Patient Details:   Name: Melinda Calderon DOB: 09-Aug-2001 MRN: [id number]   Airway Documentation:    Vent end date: 12/20/23 Vent end time: 1738   Evaluation  O2 sats: stable throughout Complications: No apparent complications Patient did tolerate procedure well. Bilateral Breath Sounds: Diminished, Clear   Yes  Pt extubated w/o complication to 4 lpm. +cuff leark; no stridor noted.  Leafy Ro 12/20/2023, 5:45 PM

## 2023-12-20 NOTE — Progress Notes (Signed)
vLTM maintenance  All impedances below 10k.  No skin breakdown noted at FP1  FP2  C3  C4

## 2023-12-20 NOTE — Progress Notes (Signed)
NEUROLOGY CONSULT FOLLOW UP NOTE   Date of service: December 20, 2023 Patient Name: Melinda Calderon MRN:  [id number] DOB:  11-05-2001  Brief HPI / hospital course:  Melinda Calderon is a 23 y.o. 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  Was started back on her home medications and was getting ready be discharged when she had another seizure.  Plan in place to transfer to [facility] for LTM EEG and spell capture and characterization. Overnight 1/17 - 1/18, patient had multiple seizure-like events and was given doses of Ativan and Versed. Her oxygen then dropped to 86% and she required intubation for airway protection early on 1/18, Ceribell negative, arrived to [facility] evening of 1/18 and started on LTM then   Interval Hx/subjective   - No further concern for seizure like activity,  - This morning on Fentanyl 100, Propofol 50 and Versed 7 gtts - Current AEDs:  Lamictal 100 mg BID,  Keppra 1500 mg BID,  Perampanel 4 mg QHS  S/p Depakote loading dose on 1/18 at 8 AM (1500 mg); 25 mg/kg   Vitals   Vitals:   12/20/23 0700 12/20/23 0800 12/20/23 0809 12/20/23 0900  BP: 115/69 123/80  126/83  Pulse: (!) 115 (!) 119 (!) 118 (!) 123  Resp: 18 18 18 18   Temp:  98.9 F (37.2 C)    TempSrc:  Oral    SpO2: 100% 100% 100% 100%  Weight:      Height:         Body mass index is 25.39 kg/m.  Physical Exam   Constitutional: Appears ill Eyes: No scleral injection.  HENT: No OP obstrucion. ETT, OG present.  Head: Normocephalic.  Cardiovascular: Tachycardic to 120s, Normotensive Respiratory: Mechanically ventilated. Breathing over vent.  GI: Soft.  No distension. There is no tenderness  Neurologic Examination   Intubated and sedated, with sedation paused briefly Open eyes to voice,  does not orient to examiner. PERRL, tongue midline (protruding intermittently spontaneously) Wiggles  toes to command bilaterally, squeezes hands and lets go bilaterally to command, slightly brisker on the left than the right but unclear if this is reliable or due to encephalopathy (slow to respond in general)  Medications  Current Facility-Administered Medications:    cefTRIAXone (ROCEPHIN) 1 g in sodium chloride 0.9 % 100 mL IVPB, 1 g, Intravenous, Q24H, Osvaldo Shipper, MD, Stopped at 12/19/23 1735   Chlorhexidine Gluconate Cloth 2 % PADS 6 each, 6 each, Topical, Q2200, Martina Sinner, MD, 6 each at 12/19/23 2124   docusate (COLACE) 50 MG/5ML liquid 100 mg, 100 mg, Per Tube, BID, Melody Comas B, MD, 100 mg at 12/20/23 0922   enoxaparin (LOVENOX) injection 40 mg, 40 mg, Subcutaneous, Q24H, Osvaldo Shipper, MD, 40 mg at 12/19/23 1432   famotidine (PEPCID) tablet 20 mg, 20 mg, Per Tube, BID, Luciano Cutter, MD, 20 mg at 12/20/23 8657   fentaNYL (SUBLIMAZE) bolus via infusion 50-100 mcg, 50-100 mcg, Intravenous, Q15 min PRN, Martina Sinner, MD, 100 mcg at 12/19/23 1849   fentaNYL in NS (69mcg/ml) infusion-PREMIX, 50-200 mcg/hr, Intravenous, Continuous, Melody Comas B, MD, Last Rate: 10 mL/hr at 12/20/23 0900, 100 mcg/hr at 12/20/23 0900   lamoTRIgine (LAMICTAL) tablet 100 mg, 100 mg, Per Tube, BID, Melody Comas B, MD, 100 mg at 12/20/23 8469   levETIRAcetam (KEPPRA) 100 MG/ML solution 1,500 mg, 1,500 mg, Per Tube, BID, Martina Sinner, MD,  1,500 mg at 12/20/23 5366   LORazepam (ATIVAN) injection 1 mg, 1 mg, Intravenous, Q4H PRN, Osvaldo Shipper, MD, 2 mg at 12/19/23 0534   LORazepam (ATIVAN) injection 1 mg, 1 mg, Intravenous, Once, Anthoney Harada, NP   midazolam (VERSED) 100 mg/100 mL (1 mg/mL) premix infusion, 0.5-10 mg/hr, Intravenous, Continuous, Sundil, Subrina, MD, Last Rate: 7 mL/hr at 12/20/23 0900, 7 mg/hr at 12/20/23 0900   midazolam (VERSED) bolus via infusion 2 mg, 2 mg, Intravenous, Q1H PRN, Janalyn Shy, Subrina, MD, 2 mg at 12/19/23 0723   ondansetron  (ZOFRAN) injection 4 mg, 4 mg, Intravenous, Q6H PRN, Osvaldo Shipper, MD, 4 mg at 12/18/23 1327   Oral care mouth rinse, 15 mL, Mouth Rinse, Q2H, Dewald, Bettina Gavia, MD, 15 mL at 12/20/23 0830   Oral care mouth rinse, 15 mL, Mouth Rinse, PRN, Martina Sinner, MD   perampanel [facility]) tablet 4 mg, 4 mg, Per Tube, QHS, Martina Sinner, MD, 4 mg at 12/19/23 2300   polyethylene glycol (MIRALAX / GLYCOLAX) packet 17 g, 17 g, Per Tube, Daily, Martina Sinner, MD, 17 g at 12/19/23 1110   propofol (DIPRIVAN) 1000 MG/100ML infusion, 0-50 mcg/kg/min, Intravenous, Continuous, Martina Sinner, MD, Last Rate: 17.7 mL/hr at 12/20/23 0921, 50 mcg/kg/min at 12/20/23 0921  Labs and Diagnostic Imaging   CBC:  Recent Labs  Lab 12/15/23 1906 12/16/23 0400 12/18/23 0422 12/19/23 0815  WBC 5.2   < > 7.1 7.3  NEUTROABS 2.3  --   --   --   HGB 13.8   < > 13.3 13.5  HCT 42.0   < > 39.5 40.4  MCV 98.4   < > 97.3 96.0  PLT 277   < > 274 301   < > = values in this interval not displayed.    Basic Metabolic Panel:  Lab Results  Component Value Date   NA 135 12/20/2023   K 3.8 12/20/2023   CO2 26 12/20/2023   GLUCOSE 91 12/20/2023   BUN <5 (L) 12/20/2023   CREATININE 0.61 12/20/2023   CALCIUM 9.2 12/20/2023   GFRNONAA >60 12/20/2023   GFRAA 137 01/06/2021   Lipid Panel: No results found for: "LDLCALC" HgbA1c: No results found for: "HGBA1C" Urine Drug Screen:     Component Value Date/Time   LABOPIA NONE DETECTED 12/16/2023 1331   COCAINSCRNUR NONE DETECTED 12/16/2023 1331   LABBENZ POSITIVE (A) 12/16/2023 1331   AMPHETMU NONE DETECTED 12/16/2023 1331   THCU POSITIVE (A) 12/16/2023 1331   LABBARB NONE DETECTED 12/16/2023 1331    Alcohol Level     Component Value Date/Time   ETH <10 12/15/2023 1907   INR No results found for: "INR" APTT No results found for: "APTT" AED levels:  Lab Results  Component Value Date   LAMOTRIGINE 5.0 05/21/2023   LEVETIRACETA <2.0 (L) 12/15/2023     CT Head without contrast(Personally reviewed): No acute intracranial process   rEEG 1/15:  Within normal limits. No seizures or epileptiform discharges were seen throughout the recording.   cEEG 1/18-1/19 - Continuous slow, generalized IMPRESSION: This study is suggestive of severe diffuse encephalopathy likely related to sedation. No seizures or epileptiform discharges were seen throughout the recording.  Assessment   Melinda Calderon is a 24 y.o. 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  Was started back on her home medications and was getting ready be discharged when she had another seizure, ultimately intubated  for airway protection after multiple doses of Keppra  Exam this morning reassuring, able to follow commands in all four extremities with sedation briefly paused, possibly brisker on the left than the right.   LTM EEG without evidence of seizures  Recommendations  - continue LTM EEG while weaning sedation - Continue her Keppra 1500 twice daily, lamotrigine 200 twice daily and Fycompa 4 mg nightly. - Wean propofol/versed with transition to precedex/fentanyl if EEG negative (first taper versed to 4 mg / hr, then to 2 mg/hr, then to 1 mg/hr, then off, every hour, then taper propofol by 10 mg / hr) - Maintain seizure precautions, notify neurology of any further concerns for seizure activity - Neurology will follow along, appreciate management of comorbidities per primary team, discussed via secure chat with Dr. Ardeth Perfect ______________________________________________________________________   Brooke Dare MD-PhD [facility] [phone] Available 7 AM to 7 PM, outside these hours please contact Neurologist on call listed on AMION   CRITICAL CARE Performed by: Gordy Councilman   Total critical care time: 35 minutes  Critical care time was exclusive of  separately billable procedures and treating other patients.  Critical care was necessary to treat or prevent imminent or life-threatening deterioration.  Critical care was time spent personally by me on the following activities: development of treatment plan with patient and/or surrogate as well as nursing, discussions with consultants, evaluation of patient's response to treatment, examination of patient, obtaining history from patient or surrogate, ordering and performing treatments and interventions, ordering and review of laboratory studies, ordering and review of radiographic studies, pulse oximetry and re-evaluation of patient's condition.

## 2023-12-21 ENCOUNTER — Other Ambulatory Visit (HOSPITAL_COMMUNITY): Payer: Self-pay

## 2023-12-21 ENCOUNTER — Inpatient Hospital Stay (HOSPITAL_COMMUNITY): Payer: Medicaid Other

## 2023-12-21 DIAGNOSIS — R569 Unspecified convulsions: Secondary | ICD-10-CM | POA: Diagnosis not present

## 2023-12-21 LAB — BASIC METABOLIC PANEL
Anion gap: 12 (ref 5–15)
BUN: <5 mg/dL — ABNORMAL LOW (ref 6–20)
CO2: 25 mmol/L (ref 22–32)
Calcium: 9.3 mg/dL (ref 8.9–10.3)
Chloride: 99 mmol/L (ref 98–111)
Creatinine, Ser: 0.71 mg/dL (ref 0.44–1.00)
GFR, Estimated: >60 mL/min (ref >60)
Glucose, Bld: 90 mg/dL (ref 70–99)
Potassium: 4 mmol/L (ref 3.5–5.1)
Sodium: 136 mmol/L (ref 135–145)

## 2023-12-21 LAB — LAMOTRIGINE LEVEL: Lamotrigine Lvl: <1 ug/mL — ABNORMAL LOW (ref 2.0–20.0)

## 2023-12-21 MED ORDER — PHENOL 1.4 % MT LIQD: 1.0000 | OROMUCOSAL | Status: DC | PRN

## 2023-12-21 MED ORDER — LAMOTRIGINE 100 MG PO TABS
200.0000 mg | ORAL_TABLET | Freq: Two times a day (BID) | ORAL | Status: DC
  Administered 2023-12-22: 200 mg via ORAL
  Filled 2023-12-21: qty 2

## 2023-12-21 MED ORDER — LEVETIRACETAM 750 MG PO TABS
1500.0000 mg | ORAL_TABLET | Freq: Two times a day (BID) | ORAL | Status: DC
Start: 2023-12-21 — End: 2023-12-22
  Administered 2023-12-21 – 2023-12-22 (×3): 1500 mg via ORAL
  Filled 2023-12-21 (×3): qty 2

## 2023-12-21 MED ORDER — GADOBUTROL 1 MMOL/ML IV SOLN
6.0000 mL | Freq: Once | INTRAVENOUS | Status: AC | PRN
  Administered 2023-12-21: 6 mL via INTRAVENOUS

## 2023-12-21 NOTE — Progress Notes (Signed)
vLTM discontinued.  [facility] notified.  No skin breakdown noted at all skin sites. ?

## 2023-12-21 NOTE — Progress Notes (Signed)
NEUROLOGY CONSULT FOLLOW UP NOTE   Date of service: December 21, 2023 Patient Name: Melinda Calderon MRN:  [id number] DOB:  [birth year]  Brief HPI / hospital course:  Melinda Calderon is a 23 y.o. 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  Was started back on her home medications and was getting ready be discharged when she had another seizure.  Plan in place to transfer to [facility] for LTM EEG and spell capture and characterization. Overnight 1/17 - 1/18, patient had multiple seizure-like events and was given doses of Ativan and Versed. Her oxygen then dropped to 86% and she required intubation for airway protection early on 1/18, Ceribell negative, arrived to [facility] evening of 1/18 and started on LTM then. Extubated 1/19 in the afternoon.   Interval Hx/subjective   - seizure like activity last night around 2200 for roughly 30 seconds, no EEG correlate  - EEG normal as of 1/20 at 1300 - Extubated 1/19 - Current AEDs:  Lamictal 200 mg BID, (previously listed as 100 BID, clarified with patient) Keppra 1500 mg BID,  Perampanel 4 mg QHS  S/p Depakote loading dose on 1/18 at 8 AM (1500 mg); 25 mg/kg   Vitals   Vitals:   12/21/23 0600 12/21/23 0700 12/21/23 0800 12/21/23 1000  BP: 108/75 107/74 110/79 (!) 142/107  Pulse: (!) 105 (!) 102 93 100  Resp:    18  Temp:   98.7 F (37.1 C)   TempSrc:   Oral   SpO2: 99% 96% 100% 100%  Weight:      Height:         Body mass index is 25.39 kg/m.  Physical Exam   Constitutional: Appears ill Eyes: No scleral injection.  HENT: No OP obstrucion. Head: Normocephalic.  Cardiovascular: Tachycardic to 120s, Normotensive Respiratory:  GI: Soft.  No distension. There is no tenderness  Neurologic Examination   Neuro: Mental Status: Patient is awake, alert, oriented to person, place, month, year, and situation. Patient is able  to give a clear and coherent history. No signs of aphasia or neglect Cranial Nerves: II: Visual Fields are full. Pupils are equal, round, and reactive to light.   III,IV, VI: EOMI without ptosis or diploplia.  V: Facial sensation is symmetric to temperature VII: Facial movement is symmetric resting and smiling VIII: Hearing is intact to voice X: Palate elevates symmetrically, endorses sore throat, voice is normal XI: Shoulder shrug is symmetric. XII: Tongue protrudes midline without atrophy or fasciculations.  Motor: Tone is normal. Bulk is normal.  5/5 strength was present in all four extremities.  Sensory: Sensation is symmetric to light touch and temperature in the arms and legs.  Cerebellar: FNF and HKS are intact bilaterally Gait: Steady with in limitations of ICU monitoring and EEG   Medications  Current Facility-Administered Medications:    Chlorhexidine Gluconate Cloth 2 % PADS 6 each, 6 each, Topical, Q2200, Melody Comas B, MD, 6 each at 12/20/23 2200   docusate sodium (COLACE) capsule 100 mg, 100 mg, Oral, BID, Calton Dach I, RPH, 100 mg at 12/21/23 0959   enoxaparin (LOVENOX) injection 40 mg, 40 mg, Subcutaneous, Q24H, Osvaldo Shipper, MD, 40 mg at 12/20/23 1505   lamoTRIgine (LAMICTAL) tablet 100 mg, 100 mg, Oral, BID, Calton Dach I, RPH, 100 mg at 12/21/23 0959   levETIRAcetam (KEPPRA) tablet 1,500 mg, 1,500 mg, Oral, BID, Martina Sinner, MD, 1,500 mg at 12/21/23  0959   LORazepam (ATIVAN) injection 1 mg, 1 mg, Intravenous, Q4H PRN, Osvaldo Shipper, MD, 2 mg at 12/19/23 0534   LORazepam (ATIVAN) injection 1 mg, 1 mg, Intravenous, Once, Anthoney Harada, NP   ondansetron [facility]) injection 4 mg, 4 mg, Intravenous, Q6H PRN, Osvaldo Shipper, MD, 4 mg at 12/18/23 1327   perampanel (FYCOMPA) tablet 4 mg, 4 mg, Oral, QHS, Calton Dach I, RPH, 4 mg at 12/20/23 2218   polyethylene glycol (MIRALAX / GLYCOLAX) packet 17 g, 17 g, Oral, Daily, De Burrs, [facility]  Labs and Diagnostic Imaging   CBC:  Recent Labs  Lab 12/15/23 1906 12/16/23 0400 12/18/23 0422 12/19/23 0815  WBC 5.2   < > 7.1 7.3  NEUTROABS 2.3  --   --   --   HGB 13.8   < > 13.3 13.5  HCT 42.0   < > 39.5 40.4  MCV 98.4   < > 97.3 96.0  PLT 277   < > 274 301   < > = values in this interval not displayed.    Basic Metabolic Panel:  Lab Results  Component Value Date   NA 136 12/21/2023   K 4.0 12/21/2023   CO2 25 12/21/2023   GLUCOSE 90 12/21/2023   BUN <5 (L) 12/21/2023   CREATININE 0.71 12/21/2023   CALCIUM 9.3 12/21/2023   GFRNONAA >60 12/21/2023   GFRAA 137 01/06/2021   Lipid Panel: No results found for: "LDLCALC" HgbA1c: No results found for: "HGBA1C" Urine Drug Screen:     Component Value Date/Time   LABOPIA NONE DETECTED 12/16/2023 1331   COCAINSCRNUR NONE DETECTED 12/16/2023 1331   LABBENZ POSITIVE (A) 12/16/2023 1331   AMPHETMU NONE DETECTED 12/16/2023 1331   THCU POSITIVE (A) 12/16/2023 1331   LABBARB NONE DETECTED 12/16/2023 1331    Alcohol Level     Component Value Date/Time   ETH <10 12/15/2023 1907   INR No results found for: "INR" APTT No results found for: "APTT" AED levels:  Lab Results  Component Value Date   LAMOTRIGINE 5.0 05/21/2023   LEVETIRACETA <2.0 (L) 12/15/2023    CT Head without contrast(Personally reviewed): No acute intracranial process   rEEG 1/15:  Within normal limits. No seizures or epileptiform discharges were seen throughout the recording.   cEEG 1/18-1/19 - Continuous slow, generalized IMPRESSION: This study is suggestive of severe diffuse encephalopathy likely related to sedation. No seizures or epileptiform discharges were seen throughout the recording.  Assessment   Melinda Calderon is a 23 y.o. 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  Was started back on her home medications and was  getting ready be discharged when she had another seizure, ultimately intubated for airway protection after multiple doses of Keppra Extubated 1/19; LTM EEG without evidence of seizures  Discussed that the event 1/19 at 8 PM was non-epileptic based on EEG findings. On my limited review, no clear evidence of captured ictal-interictal activity on EEG. Discussed with patient and mother at bedside that I agree with Dr. Rosalyn Gess prior long term plan for EMU stay to clarify her diagnosis; this will need to be arranged outpatient. She is interested in reducing medications due to side effects and EMU stay could greatly help in safely achieving this goal   Would like to complete MRI brain w/ and w/o contrast to complete seizure workup  They are additional requesting help with transportation to appointments and assistance with  disability paperwork; defer to [facility] team / primary team / PCP for these requests  Recommendations  - DC LTM EEG - MRI brain w/ and w/o contrast - Continue her Keppra 1500 twice daily, lamotrigine 200 twice daily (per patient reported current dose) and Fycompa 4 mg nightly. - Maintain seizure precautions; reviewed no driving and other precautions with patient.  - Continue outpatient follow-up with Dr. Karel Jarvis per 05/29/23 telephone note "Ok to put on waitlist for cancellation, pls remind her about our no-show policy, if she misses next appt, we are unable to see her after." Have also placed referral to Dr. Teresa Coombs at [facility] in case she is unable to continue at [facility] - Given at least some of her events are PNES, would have a higher threshold to treat seizure-like activity (try to assess for responsiveness of patient to saline stimulation to the eyes, for example), treat only if ongoing concerning activity with concern for significant vital sign changes - Neurology will follow up MRI brain but will otherwise sign off at this time, please reach out if additional questions/concerns arise, including  concerns for recurrent seizure-like activity   ______________________________________________________________________   Patient seen and examined by NP/APP with MD. MD to update note as needed.   Elmer Picker, DNP, FNP-BC [facility] Pager: [phone]  Attending Neurologist's note:  I personally saw this patient, gathering history, performing a neurologic examination, reviewing relevant labs, personally reviewing relevant imaging including head CT, and formulated the assessment and plan, adding the note above for completeness and clarity to accurately reflect my thoughts   Brooke Dare MD-PhD [facility] [phone] Available 7 AM to 7 PM, outside these hours please contact Neurologist on call listed on AMION

## 2023-12-21 NOTE — Progress Notes (Signed)
Patient received. Oriented to room and call bell. Admitted to cardiac monitoring. Denies complaint of pain. Seizure pads in place. Family at bedside.

## 2023-12-21 NOTE — Progress Notes (Addendum)
   NAME:  Melinda Calderon, MRN:  [id number], DOB:  29-Jun-2001, LOS: 5 ADMISSION DATE:  12/15/2023, CONSULTATION DATE:  12/18/22 REFERRING MD:  [facility] CHIEF COMPLAINT:  Seizures   History of Present Illness:  Melinda Calderon is a 23 year old woman with seziure disorder who is admitted was admitted for seizures after running out of medications. She was scheduled for discharge 1/16 but then developed recurrent seziures and transferred to step down unit. This morning she developed status epilepticus requiring intubation and sedation. Cerebell evaluation is negative for seizure activity.   PCCM consulted for further management since she was intubated.   Per family, patient was unable to be seen in her neurologists office and ran out of active prescriptions for her medications.   Pertinent  Medical History   Past Medical History:  Diagnosis Date   Anxiety    GERD (gastroesophageal reflux disease)    Seizures (HCC)    Significant Hospital Events: Including procedures, antibiotic start and stop dates in addition to other pertinent events   1/14 admitted for seizures 1/18 intubated due to status epilepticus 1/19 extubated, on cEEG 1/20 Improving and progressive, still on continuous EEG   Interim History / Subjective:  Patient following commands, "asking when to go home."  Objective   Blood pressure (!) 130/92, pulse (!) 101, temperature 98.7 F (37.1 C), temperature source Oral, resp. rate 17, height 5' (1.524 m), weight 59 kg, SpO2 97%.    Intake/Output Summary (Last 24 hours) at 12/21/2023 1206 Last data filed at 12/21/2023 1100 Gross per 24 hour  Intake 682.64 ml  Output 2975 ml  Net -2292.36 ml   Filed Weights   12/16/23 1222 12/16/23 1554  Weight: 54.3 kg 59 kg    Examination: General: pleasant young adult female, sitting up in ICU bed HEENT: Normocephalic, Continuous EEG on scalp, Pink MM Pulm: clear BL breath sounds, no distress Cards: S1,s2, RRR, no MRG, no JVD Abs: BS  active, soft  Skin: no wounds or skin deformity  Extremities: no edema, moves all extremities with ease  GU: Foley, clear urine output    Resolved Hospital Problem list   Intubated for airway protection  Hypokalemia   Assessment & Plan:  Status Epilepticus On cEEG P: Continue AEDs per neurology, appreciate assistance/recs Keppra 1.5 g BID, Lamictal 100mg  BID, perampanel 4mg  at bedtime per neuro recs Continue seizure precautions, hopefully able to come off cEEG will defer to neuro  Continue to optimize electrolytes, trend daily Remove foley cath  PCCM will sign off to [facility], [facility] pick up 1/21   Concern for UTI 1/15 UA Positive Nitrates, moderate leuk Urine culture inconclusive  Received 5 days of 1g of rocephin  1/20 WBC wnl  P: DC rocephin for UTI  Continue to trend WBC    Best Practice (right click and "Reselect all SmartList Selections" daily)   Diet/type: clear diet with advancement  DVT prophylaxis SCD Pressure ulcer(s): N/A GI prophylaxis: H2B Lines: N/A Foley:  Yes, and it is still needed Code Status:  full code Last date of multidisciplinary goals of care discussion-updated patient at beside on 1/20   Hazel Sams AGACNP-BC   [facility] 12/21/2023, 12:24 PM  Please see [email or web address] for pager details.  From 7A-7P if no response, please call [phone]. After hours, please call ELink [phone].

## 2023-12-21 NOTE — Procedures (Addendum)
Patient Name: MEASIA GALICIA  MRN: [id number]  Epilepsy Attending: Charlsie Quest  Referring Physician/Provider: Milon Dikes, MD  Duration: 12/20/2023 2054 to 12/21/2023 1352   Patient history: 23yo f with seizure like activity getting eeg to evaluate for seizure   Level of alertness: awake, asleep   AEDs during EEG study:  LEV, LTG, perampanel   Technical aspects: This EEG study was done with scalp electrodes positioned according to the 10-20 International system of electrode placement. Electrical activity was reviewed with band pass filter of 1-70Hz , sensitivity of 7 uV/mm, display speed of 42mm/sec with a 60Hz  notched filter applied as appropriate. EEG data were recorded continuously and digitally stored.  Video monitoring was available and reviewed as appropriate.   Description: The posterior dominant rhythm consists of 8-9 Hz activity of moderate voltage (25-35 uV) seen predominantly in posterior head regions, symmetric and reactive to eye opening and eye closing. Sleep was characterized by vertex waves, sleep spindles (12 to 14 Hz), maximal frontocentral region. Hyperventilation and photic stimulation were not performed.      IMPRESSION: This study was within normal limits.  No seizures or epileptiform discharges were seen throughout the recording.     Stepheny Canal Annabelle Harman

## 2023-12-21 NOTE — Plan of Care (Signed)
Patient admitted s/p seizure at home. Upon initial assessment patient was able to follow commands. No complaints of pain noted. Foley removed per order, patient able to void, up to bedside commode with standby assistance. Continuous EEG discontinued. Patient and family updated in plan of care. Floor status pending bed availability.   Problem: Education: Goal: Knowledge of General Education information will improve Description: Including pain rating scale, medication(s)/side effects and non-pharmacologic comfort measures Outcome: Progressing   Problem: Health Behavior/Discharge Planning: Goal: Ability to manage health-related needs will improve Outcome: Progressing   Problem: Clinical Measurements: Goal: Ability to maintain clinical measurements within normal limits will improve Outcome: Progressing Goal: Will remain free from infection Outcome: Progressing Goal: Diagnostic test results will improve Outcome: Progressing Goal: Respiratory complications will improve Outcome: Progressing Goal: Cardiovascular complication will be avoided Outcome: Progressing   Problem: Activity: Goal: Risk for activity intolerance will decrease Outcome: Progressing   Problem: Nutrition: Goal: Adequate nutrition will be maintained Outcome: Progressing   Problem: Coping: Goal: Level of anxiety will decrease Outcome: Progressing   Problem: Elimination: Goal: Will not experience complications related to bowel motility Outcome: Progressing Goal: Will not experience complications related to urinary retention Outcome: Progressing   Problem: Pain Managment: Goal: General experience of comfort will improve and/or be controlled Outcome: Progressing   Problem: Safety: Goal: Ability to remain free from injury will improve Outcome: Progressing   Problem: Skin Integrity: Goal: Risk for impaired skin integrity will decrease Outcome: Progressing   Problem: Education: Goal: Expressions of having a  comfortable level of knowledge regarding the disease process will increase Outcome: Progressing   Problem: Coping: Goal: Ability to adjust to condition or change in health will improve Outcome: Progressing Goal: Ability to identify appropriate support needs will improve Outcome: Progressing   Problem: Health Behavior/Discharge Planning: Goal: Compliance with prescribed medication regimen will improve Outcome: Progressing   Problem: Medication: Goal: Risk for medication side effects will decrease Outcome: Progressing   Problem: Clinical Measurements: Goal: Complications related to the disease process, condition or treatment will be avoided or minimized Outcome: Progressing Goal: Diagnostic test results will improve Outcome: Progressing   Problem: Safety: Goal: Verbalization of understanding the information provided will improve Outcome: Progressing   Problem: Self-Concept: Goal: Level of anxiety will decrease Outcome: Progressing Goal: Ability to verbalize feelings about condition will improve Outcome: Progressing   Problem: Activity: Goal: Ability to tolerate increased activity will improve Outcome: Progressing   Problem: Respiratory: Goal: Ability to maintain a clear airway and adequate ventilation will improve Outcome: Progressing   Problem: Role Relationship: Goal: Method of communication will improve Outcome: Progressing   Problem: Safety: Goal: Non-violent Restraint(s) Outcome: Progressing

## 2023-12-22 ENCOUNTER — Other Ambulatory Visit (HOSPITAL_COMMUNITY): Payer: Self-pay

## 2023-12-22 DIAGNOSIS — R569 Unspecified convulsions: Secondary | ICD-10-CM | POA: Diagnosis not present

## 2023-12-22 LAB — BASIC METABOLIC PANEL
Anion gap: 11 (ref 5–15)
BUN: 5 mg/dL — ABNORMAL LOW (ref 6–20)
CO2: 23 mmol/L (ref 22–32)
Calcium: 9.6 mg/dL (ref 8.9–10.3)
Chloride: 99 mmol/L (ref 98–111)
Creatinine, Ser: 0.58 mg/dL (ref 0.44–1.00)
GFR, Estimated: >60 mL/min (ref >60)
Glucose, Bld: 98 mg/dL (ref 70–99)
Potassium: 5 mmol/L (ref 3.5–5.1)
Sodium: 133 mmol/L — ABNORMAL LOW (ref 135–145)

## 2023-12-22 MED ORDER — FYCOMPA 4 MG PO TABS
ORAL_TABLET | ORAL | 5 refills | Status: AC
  Filled 2023-12-22: qty 30, fill #0

## 2023-12-22 MED ORDER — LEVETIRACETAM 750 MG PO TABS
1500.0000 mg | ORAL_TABLET | Freq: Two times a day (BID) | ORAL | 3 refills | Status: AC
  Filled 2023-12-22: qty 120, 30d supply, fill #0
  Filled 2024-01-22: qty 120, 30d supply, fill #1
  Filled 2024-04-18: qty 120, 30d supply, fill #2

## 2023-12-22 MED ORDER — LAMOTRIGINE 200 MG PO TABS
200.0000 mg | ORAL_TABLET | Freq: Two times a day (BID) | ORAL | 2 refills | Status: AC
  Filled 2023-12-22: qty 60, 30d supply, fill #0
  Filled 2024-01-22: qty 60, 30d supply, fill #1
  Filled 2024-04-18: qty 60, 30d supply, fill #2

## 2023-12-22 NOTE — TOC Transition Note (Signed)
Transition of Care [facility]) - Discharge Note   Patient Details  Name: EIMY MOSQUERA MRN: [id number] Date of Birth: [birth year]  [facility] of [facility]) CM/SW Contact:  Kermit Balo, RN Phone Number: 12/22/2023, 10:16 AM   Clinical Narrative:     Pt is discharging home with self care. Pt has transportation home.  Final next level of care: Home/Self Care Barriers to Discharge: No Barriers Identified   Patient Goals and CMS Choice            Discharge Placement                       Discharge Plan and Services Additional resources added to the After Visit Summary for                                       Social Drivers of Health (SDOH) Interventions SDOH Screenings   Food Insecurity: Food Insecurity Present (12/16/2023)  Housing: Low Risk  (12/16/2023)  Transportation Needs: Unmet Transportation Needs (12/16/2023)  Utilities: Not At Risk (12/16/2023)  Recent Concern: Utilities - At Risk (10/28/2023)  Social Connections: Unknown (10/03/2023)   Received from [facility]  Tobacco Use: Low Risk  (12/15/2023)     Readmission Risk Interventions     No data to display

## 2023-12-22 NOTE — Discharge Summary (Signed)
Physician Discharge Summary  Narely Degraw Brossard ION:[id number] DOB: Dec 08, 2000 DOA: 12/15/2023  PCP: Christel Mormon, MD  Admit date: 12/15/2023 Discharge date: 12/22/2023  Admitted From: Home Disposition: Home  Recommendations for Outpatient Follow-up:  Follow up with PCP in 1-2 weeks Neurology office to schedule follow-up  Home Health: N/A Equipment/Devices: N/A  Discharge Condition: Stable CODE STATUS: Full code Diet recommendation: Regular diet  Discharge summary: 23 year old with seizure disorder was admitted initially due to breakthrough seizure after she ran out of the medications.  Started back on medications and about to be discharged on 1/16 had recurrent seizure episodes.  She was transferred to stepdown unit, on the morning of 1/18 patient started having status epilepticus.  She was intubated and brought to neuro ICU.  She was extubated next day on continuous EEG.  Ultimately stabilized.  Followed by critical care and neurology, seizure medications were adjusted and currently stabilized to be discharged.  Seizure disorder, noncompliance to medication, recurrent seizure.  Status epilepticus requiring intubation and mechanical ventilation. Clinically improved. Continue seizure precautions, she is well aware about driving restrictions and seizure precautions.  Outpatient follow-up with neurology. Brain MRI without significant findings. Neurology recommended to discharge patient on Keppra 1500 mg twice daily, lamotrigine 200 mg twice daily, Fycompa 4 mg nightly.  Adequate prescriptions for 3 months were sent to transition of care pharmacy to ensure patient is receiving medications.  Recommended to continue and keep up the follow-ups and not to miss seizure medications.  Suspected UTI: She had received 2 doses of antibiotics.  Urine culture inconclusive.  Asymptomatic.  No further antibiotics needed.  Stable for discharge with mother.   Discharge Diagnoses:  Principal  Problem:   Seizures (HCC) Active Problems:   Seizure (HCC)   Acute hypoxic respiratory failure (HCC)   Status epilepticus [facility])    Discharge Instructions  Discharge Instructions     Ambulatory referral to Neurology   Complete by: As directed    An appointment is requested in approximately: 4 weeks - 8 weeks   Call MD for:  difficulty breathing, headache or visual disturbances   Complete by: As directed    Call MD for:  extreme fatigue   Complete by: As directed    Call MD for:  persistant dizziness or light-headedness   Complete by: As directed    Call MD for:  persistant nausea and vomiting   Complete by: As directed    Call MD for:  severe uncontrolled pain   Complete by: As directed    Call MD for:  temperature >100.4   Complete by: As directed    Diet - low sodium heart healthy   Complete by: As directed    Diet general   Complete by: As directed    Discharge instructions   Complete by: As directed    Please take your medications as prescribed.  Please do not miss taking your seizure medicines.  Recommended [place] DMV statutes, patients with seizures are not allowed to drive until they have been seizure-free for six months. Use caution when using heavy equipment or power tools. Avoid working on ladders or at heights. Take showers instead of baths. Ensure the water temperature is not too high on the home water heater. Do not go swimming alone. Do not lock yourself in a room alone (i.e. bathroom). Maintain good sleep hygiene. Avoid alcohol.     Increase activity slowly   Complete by: As directed    Increase activity slowly   Complete by: As  directed       Allergies as of 12/22/2023   No Known Allergies      Medication List     TAKE these medications    cefadroxil 500 MG capsule Commonly known as: DURICEF Take 1 capsule (500 mg total) by mouth 2 (two) times daily for 5 days.   Fycompa 4 MG Tabs Generic drug: Perampanel Take 1 tablet at bedtime   lamoTRIgine  200 MG tablet Commonly known as: LAMICTAL Take 1 tablet (200 mg total) by mouth 2 (two) times daily. What changed:  medication strength how much to take how to take this when to take this additional instructions   levETIRAcetam 750 MG tablet Commonly known as: KEPPRA Take 2 tablets (1,500 mg total) by mouth 2 (two) times daily. What changed:  medication strength how much to take how to take this when to take this additional instructions   ondansetron 4 MG disintegrating tablet Commonly known as: Zofran ODT Take 1 tablet (4 mg total) by mouth every 8 (eight) hours as needed for nausea or vomiting.        Follow-up Information     Coccaro, Althea Grimmer, MD. Schedule an appointment as soon as possible for a visit in 1 week(s).   Specialty: Pediatrics Why: post hospitalization follow up Contact information: [address] [place] [address] [phone]         Van Clines, MD. Schedule an appointment as soon as possible for a visit.   Specialty: Neurology Why: post hospitalization follow up Contact information: [address] [place] [address] [phone]                No Known Allergies  Consultations: Neurology Critical care   Procedures/Studies: MR BRAIN W WO CONTRAST Result Date: 12/22/2023 CLINICAL DATA:  Initial evaluation for seizure disorder, clinical change. EXAM: MRI HEAD WITHOUT AND WITH CONTRAST TECHNIQUE: Multiplanar, multiecho pulse sequences of the brain and surrounding structures were obtained without and with intravenous contrast. CONTRAST:  6mL GADAVIST GADOBUTROL 1 MMOL/ML IV SOLN COMPARISON:  Prior CT from 12/15/2023. FINDINGS: Brain: Cerebral volume within normal limits for age. No focal parenchymal signal abnormality. No abnormal foci of restricted diffusion to suggest acute or subacute ischemia. Gray-white matter differentiation well maintained. No encephalomalacia to suggest chronic cortical infarction  or other insult. No foci of susceptibility artifact indicative of acute or chronic intracranial blood products. No mass lesion, midline shift or mass effect. Ventricles normal in size and morphology without hydrocephalus. No extra-axial fluid collection. 6 mm nonenhancing FLAIR hyperintense lesion positioned along the superior margin of the pituitary gland, along the right lateral aspect of the pituitary stalk (series 11, image 20). No intrinsic temporal lobe abnormality. No abnormal enhancement. Vascular: Major intracranial vascular flow voids are well maintained. Skull and upper cervical spine: Craniocervical junction within normal limits. Visualized upper cervical spine demonstrates no significant finding. Bone marrow signal intensity within normal limits. No scalp soft tissue abnormality. Sinuses/Orbits: Globes and orbital soft tissues are within normal limits. Paranasal sinuses are largely clear. Trace bilateral mastoid effusions noted, of doubtful significance. Negative nasopharynx. Other: None. IMPRESSION: 1. No acute intracranial abnormality. 2. 6 mm nonenhancing FLAIR hyperintense lesion along the superior margin of the pituitary gland. 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 Further evaluation with dedicated pituitary protocol MRI could be performed for further evaluation as warranted. 3. Otherwise unremarkable and normal brain MRI. Electronically Signed  By: Rise Mu M.D.   On: 12/22/2023 03:22   DG Chest Port 1 View Result Date: 12/21/2023 CLINICAL DATA:  Extubated EXAM: PORTABLE CHEST 1 VIEW COMPARISON:  X-ray 12/19/2023. FINDINGS: Interval removal of the ET tube. No consolidation, pneumothorax or effusion. No edema. Normal cardiopericardial silhouette. Overlapping cardiac leads. IMPRESSION: ET tube no longer seen.  No acute cardiopulmonary disease Electronically Signed   By: Karen Kays M.D.   On: 12/21/2023  09:59   Overnight EEG with video Result Date: 12/20/2023 Charlsie Quest, MD     12/21/2023  1:09 PM Patient Name: ELISCIA HARTIN MRN: [id number] Epilepsy Attending: Charlsie Quest Referring Physician/Provider: Milon Dikes, MD Duration: 12/19/2023 2054 to 12/20/2023 2054 Patient history: 23yo f with seizure like activity getting eeg to evaluate for seizure Level of alertness:  comatose AEDs during EEG study: versed, propofol, LEV, LTG, perampanel Technical aspects: This EEG study was done with scalp electrodes positioned according to the 10-20 International system of electrode placement. Electrical activity was reviewed with band pass filter of 1-70Hz , sensitivity of 7 uV/mm, display speed of 54mm/sec with a 60Hz  notched filter applied as appropriate. EEG data were recorded continuously and digitally stored.  Video monitoring was available and reviewed as appropriate. Description: EEG initially showed continuous generalized 3 to 6 Hz theta-delta slowing admixed with 15 to 18 Hz beta activity distributed symmetrically and diffusely. Gradually after around 1700 on 12/20/2023, sedation was stopped and EEG showed posterior dominant rhythm of 8-9 Hz activity of moderate voltage (25-35 uV) seen predominantly in posterior head regions, symmetric and reactive to eye opening and eye closing. Sleep was characterized by vertex waves, sleep spindles (12 to 14 Hz), maximal frontocentral region. Hyperventilation and photic stimulation were not performed.   ABNORMALITY - Continuous slow, generalized IMPRESSION: This study was initially suggestive of severe diffuse encephalopathy likely related to sedation.  Gradually at around 1700 on 12/10/2023 as sedation was weaned, EEG improved and was within normal limits.  No seizures or epileptiform discharges were seen throughout the recording. Priyanka Annabelle Harman   Rapid EEG Result Date: 12/19/2023 Charlsie Quest, MD     12/20/2023 12:01 PM Patient Name: MORIREOLUWA HOLLIE MRN:  [id number] Epilepsy Attending: Charlsie Quest Referring Physician/Provider: Tereasa Coop, MD Duration: 12/19/2023 5784 to 1843 Patient history: 24yo F with seizure like activity getting eeg to evaluate for seizure Level of alertness:  comatose AEDs during EEG study: Ativan, LTG, LEV, VPA, versed Technical aspects: This EEG was obtained using a 10 lead EEG system positioned circumferentially without any parasagittal coverage (rapid EEG). Computer selected EEG is reviewed as  well as background features and all clinically significant events. Description: EEG showed continuous generalized 5  to 6 Hz theta slowing admixed with 15 to 18 Hz beta activity distributed symmetrically and diffusely. Hyperventilation and photic stimulation were not performed.   ABNORMALITY - Continuous slow, generalized - Excessive beta, generalized IMPRESSION: This limited ceribell EEG is suggestive of severe diffuse encephalopathy, likely related to sedation. No seizures or epileptiform discharges were seen throughout the recording. Charlsie Quest   DG Abd 1 View Result Date: 12/19/2023 CLINICAL DATA:  Orogastric tube placement. EXAM: ABDOMEN - 1 VIEW COMPARISON:  CT abdomen and pelvis, 01/12/2022. FINDINGS: Nasal/orogastric tube tip lies in the left upper quadrant, well within the stomach. Normal bowel gas pattern. Soft tissues are unremarkable. Lung bases are clear. Normal skeletal structures. IMPRESSION: Well-positioned nasal/orogastric tube. Electronically Signed   By: Amie Portland M.D.   On: 12/19/2023  09:21   DG Chest 1 View Result Date: 12/19/2023 CLINICAL DATA:  ETT EXAM: CHEST  1 VIEW COMPARISON:  12/31/2020 FINDINGS: Endotracheal tube with tip 1.5 cm above the carina. Artifact from EKG leads and other support hardware over the right upper chest. There is no edema, consolidation, effusion, or pneumothorax. Normal heart size and mediastinal contours. IMPRESSION: 1. Endotracheal tube with tip 1.5 cm above the carina. 2. No  evidence of active cardiopulmonary disease. Electronically Signed   By: Tiburcio Pea M.D.   On: 12/19/2023 07:32   EEG adult Result Date: 12/16/2023 Charlsie Quest, MD     12/16/2023  7:01 PM Patient Name: ADELI GONNERING MRN: [id number] Epilepsy Attending: Charlsie Quest Referring Physician/Provider: Buena Irish, MD Date: 12/16/2023 Duration: 24.55 mins Patient history: 23yo F with seizure like activity getting eeg to evaluate for seizure Level of alertness: Awake, asleep AEDs during EEG study: LEV, LTG, Fycompa Technical aspects: This EEG study was done with scalp electrodes positioned according to the 10-20 International system of electrode placement. Electrical activity was reviewed with band pass filter of 1-70Hz , sensitivity of 7 uV/mm, display speed of 68mm/sec with a 60Hz  notched filter applied as appropriate. EEG data were recorded continuously and digitally stored.  Video monitoring was available and reviewed as appropriate. Description: The posterior dominant rhythm consists of 9-10 Hz activity of moderate voltage (25-35 uV) seen predominantly in posterior head regions, symmetric and reactive to eye opening and eye closing. Sleep was characterized by vertex waves, sleep spindles (12 to 14 Hz), maximal frontocentral region.  Physiologic photic driving was not seen during photic stimulation.  Hyperventilation was not performed.   IMPRESSION: This study is within normal limits. No seizures or epileptiform discharges were seen throughout the recording. A normal interictal EEG does not exclude the diagnosis of epilepsy. Charlsie Quest   CT Head Wo Contrast Result Date: 12/15/2023 CLINICAL DATA:  Mental status change, unknown cause repeated seizures, nausea/vomiting, altered speech EXAM: CT HEAD WITHOUT CONTRAST TECHNIQUE: Contiguous axial images were obtained from the base of the skull through the vertex without intravenous contrast. RADIATION DOSE REDUCTION: This exam was performed  according to the departmental dose-optimization program which includes automated exposure control, adjustment of the mA and/or kV according to patient size and/or use of iterative reconstruction technique. COMPARISON:  Head CT 06/20/2023 FINDINGS: Brain: No hemorrhage. No hydrocephalus. No extra-axial fluid collection no CT evidence of an acute cortical infarct. No mass effect. No mass lesion. Vascular: No hyperdense vessel or unexpected calcification. Skull: Normal. Negative for fracture or focal lesion. Sinuses/Orbits: No middle ear effusion paranasal small right mastoid effusion. Paranasal sinuses are clear. Orbits are unremarkable. Other: None. IMPRESSION: No CT etiology for altered mental status or seizures identified. Electronically Signed   By: Lorenza Cambridge M.D.   On: 12/15/2023 20:46   (Echo, Carotid, EGD, Colonoscopy, ERCP)    Subjective: Patient seen and examined in the morning rounds.  Denies any complaints.  First question was "can I go home".  Mother at the bedside is eager to take her home and stated no complaints.   Discharge Exam: Vitals:   12/22/23 0316 12/22/23 0731  BP: 123/73 112/73  Pulse: (!) 105 92  Resp: 16 18  Temp: 98.9 F (37.2 C) 98 F (36.7 C)  SpO2: 99% 100%   Vitals:   12/21/23 2019 12/21/23 2328 12/22/23 0316 12/22/23 0731  BP: (!) 134/90 106/62 123/73 112/73  Pulse:  (!) 106 (!) 105 92  Resp: 18 16 16  18  Temp: 98.9 F (37.2 C) 98.1 F (36.7 C) 98.9 F (37.2 C) 98 F (36.7 C)  TempSrc: Oral Oral Oral Oral  SpO2: 100% 99% 99% 100%  Weight:      Height:        General: Pt is alert, awake, not in acute distress Cardiovascular: RRR, S1/S2 +, no rubs, no gallops Respiratory: CTA bilaterally, no wheezing, no rhonchi Abdominal: Soft, NT, ND, bowel sounds + Extremities: no edema, no cyanosis    The results of significant diagnostics from this hospitalization (including imaging, microbiology, ancillary and laboratory) are listed below for reference.      Microbiology: Recent Results (from the past 240 hours)  MRSA Next Gen by PCR, Nasal     Status: None   Collection Time: 12/19/23  7:43 PM   Specimen: Nasal Mucosa; Nasal Swab  Result Value Ref Range Status   MRSA by PCR Next Gen NOT DETECTED NOT DETECTED Final    Comment: (NOTE) The GeneXpert MRSA Assay (FDA approved for NASAL specimens only), is one component of a comprehensive MRSA colonization surveillance program. It is not intended to diagnose MRSA infection nor to guide or monitor treatment for MRSA infections. Test performance is not FDA approved in patients less than 54 years old. Performed at [facility], [address], [place], [place] [address]      Labs: BNP (last 3 results) No results for input(s): "BNP" in the last 8760 hours. Basic Metabolic Panel: Recent Labs  Lab 12/15/23 1906 12/16/23 0400 12/17/23 1312 12/18/23 0422 12/19/23 0459 12/19/23 0815 12/20/23 0432 12/21/23 0428 12/22/23 0506  NA 136 134* 136   < > 134* 136 135 136 133*  K 3.9 3.5 3.6   < > 3.8 3.0* 3.8 4.0 5.0  CL 104 106 102   < > 99 103 101 99 99  CO2 20* 18* 25   < > 24 26 26 25 23   GLUCOSE 88 92 103*   < > 94 107* 91 90 98  BUN 10 9 12    < > 10 10 <5* <5* 5*  CREATININE 0.72 0.56 0.69   < > 0.48 0.52 0.61 0.71 0.58  CALCIUM 9.3 9.2 9.2   < > 9.4 9.4 9.2 9.3 9.6  MG 2.0 2.2 2.1  --   --   --   --   --   --    < > = values in this interval not displayed.   Liver Function Tests: Recent Labs  Lab 12/15/23 1906 12/16/23 0400 12/17/23 1312 12/19/23 0815  AST 20 15 23 16   ALT 19 18 17 16   ALKPHOS 49 46 46 48  BILITOT 0.5 0.6 0.8 0.5  PROT 7.6 6.8 6.9 7.4  ALBUMIN 4.8 4.3 4.3 4.5   No results for input(s): "LIPASE", "AMYLASE" in the last 168 hours. No results for input(s): "AMMONIA" in the last 168 hours. CBC: Recent Labs  Lab 12/15/23 1906 12/16/23 0400 12/17/23 1312 12/18/23 0422 12/19/23 0815  WBC 5.2 7.6 7.4 7.1 7.3  NEUTROABS 2.3  --   --   --   --    HGB 13.8 12.9 13.1 13.3 13.5  HCT 42.0 38.4 38.7 39.5 40.4  MCV 98.4 97.2 95.1 97.3 96.0  PLT 277 283 298 274 301   Cardiac Enzymes: Recent Labs  Lab 12/19/23 0815  CKTOTAL 80   BNP: Invalid input(s): "POCBNP" CBG: Recent Labs  Lab 12/15/23 1933 12/19/23 0556  GLUCAP 88 104*   D-Dimer No  results for input(s): "DDIMER" in the last 72 hours. Hgb A1c No results for input(s): "HGBA1C" in the last 72 hours. Lipid Profile Recent Labs    12/20/23 0432  TRIG 94   Thyroid function studies No results for input(s): "TSH", "T4TOTAL", "T3FREE", "THYROIDAB" in the last 72 hours.  Invalid input(s): "FREET3" Anemia work up No results for input(s): "VITAMINB12", "FOLATE", "FERRITIN", "TIBC", "IRON", "RETICCTPCT" in the last 72 hours. Urinalysis    Component Value Date/Time   COLORURINE YELLOW 12/16/2023 1331   APPEARANCEUR CLOUDY (A) 12/16/2023 1331   LABSPEC 1.028 12/16/2023 1331   PHURINE 5.0 12/16/2023 1331   GLUCOSEU NEGATIVE 12/16/2023 1331   HGBUR NEGATIVE 12/16/2023 1331   BILIRUBINUR NEGATIVE 12/16/2023 1331   BILIRUBINUR negative 01/06/2021 1140   KETONESUR NEGATIVE 12/16/2023 1331   PROTEINUR 30 (A) 12/16/2023 1331   UROBILINOGEN 1.0 01/06/2021 1140   UROBILINOGEN 0.2 10/03/2014 1921   NITRITE POSITIVE (A) 12/16/2023 1331   LEUKOCYTESUR MODERATE (A) 12/16/2023 1331   Sepsis Labs Recent Labs  Lab 12/16/23 0400 12/17/23 1312 12/18/23 0422 12/19/23 0815  WBC 7.6 7.4 7.1 7.3   Microbiology Recent Results (from the past 240 hours)  MRSA Next Gen by PCR, Nasal     Status: None   Collection Time: 12/19/23  7:43 PM   Specimen: Nasal Mucosa; Nasal Swab  Result Value Ref Range Status   MRSA by PCR Next Gen NOT DETECTED NOT DETECTED Final    Comment: (NOTE) The GeneXpert MRSA Assay (FDA approved for NASAL specimens only), is one component of a comprehensive MRSA colonization surveillance program. It is not intended to diagnose MRSA infection nor to guide or  monitor treatment for MRSA infections. Test performance is not FDA approved in patients less than 57 years old. Performed at [facility], [address] [place], [place] [address]      Time coordinating discharge: 40 minutes  SIGNED:   Dorcas Carrow, MD  [facility] 12/22/2023, 9:32 AM

## 2024-01-02 ENCOUNTER — Emergency Department (HOSPITAL_COMMUNITY)
Admission: EM | Admit: 2024-01-02 | Discharge: 2024-01-03 | Discharge status: Against Medical Advice | Payer: Medicaid Other | Attending: Emergency Medicine | Admitting: Emergency Medicine

## 2024-01-02 ENCOUNTER — Encounter (HOSPITAL_COMMUNITY): Payer: Self-pay | Admitting: *Deleted

## 2024-01-02 ENCOUNTER — Other Ambulatory Visit: Payer: Self-pay

## 2024-01-02 DIAGNOSIS — R109 Unspecified abdominal pain: Secondary | ICD-10-CM | POA: Diagnosis not present

## 2024-01-02 DIAGNOSIS — R509 Fever, unspecified: Secondary | ICD-10-CM | POA: Diagnosis not present

## 2024-01-02 DIAGNOSIS — R519 Headache, unspecified: Secondary | ICD-10-CM | POA: Diagnosis not present

## 2024-01-02 DIAGNOSIS — R5383 Other fatigue: Secondary | ICD-10-CM | POA: Insufficient documentation

## 2024-01-02 DIAGNOSIS — Z5321 Procedure and treatment not carried out due to patient leaving prior to being seen by health care provider: Secondary | ICD-10-CM | POA: Insufficient documentation

## 2024-01-02 DIAGNOSIS — R112 Nausea with vomiting, unspecified: Secondary | ICD-10-CM | POA: Diagnosis present

## 2024-01-02 DIAGNOSIS — R197 Diarrhea, unspecified: Secondary | ICD-10-CM | POA: Insufficient documentation

## 2024-01-02 LAB — CBC WITH DIFFERENTIAL/PLATELET
Abs Immature Granulocytes: 0.04 10*3/uL (ref 0.00–0.07)
Basophils Absolute: 0 10*3/uL (ref 0.0–0.1)
Basophils Relative: 0 %
Eosinophils Absolute: 0 10*3/uL (ref 0.0–0.5)
Eosinophils Relative: 0 %
HCT: 38.6 % (ref 36.0–46.0)
Hemoglobin: 12.7 g/dL (ref 12.0–15.0)
Immature Granulocytes: 0 %
Lymphocytes Relative: 14 %
Lymphs Abs: 1.4 10*3/uL (ref 0.7–4.0)
MCH: 31.6 pg (ref 26.0–34.0)
MCHC: 32.9 g/dL (ref 30.0–36.0)
MCV: 96 fL (ref 80.0–100.0)
Monocytes Absolute: 0.4 10*3/uL (ref 0.1–1.0)
Monocytes Relative: 3 %
Neutro Abs: 8.5 10*3/uL — ABNORMAL HIGH (ref 1.7–7.7)
Neutrophils Relative %: 83 %
Platelets: 403 10*3/uL — ABNORMAL HIGH (ref 150–400)
RBC: 4.02 MIL/uL (ref 3.87–5.11)
RDW: 13.2 % (ref 11.5–15.5)
WBC: 10.3 10*3/uL (ref 4.0–10.5)
nRBC: 0 % (ref 0.0–0.2)

## 2024-01-02 LAB — LIPASE, BLOOD: Lipase: 34 U/L (ref 11–51)

## 2024-01-02 LAB — COMPREHENSIVE METABOLIC PANEL
ALT: 21 U/L (ref 0–44)
AST: 23 U/L (ref 15–41)
Albumin: 4.4 g/dL (ref 3.5–5.0)
Alkaline Phosphatase: 56 U/L (ref 38–126)
Anion gap: 12 (ref 5–15)
BUN: 10 mg/dL (ref 6–20)
CO2: 24 mmol/L (ref 22–32)
Calcium: 9.1 mg/dL (ref 8.9–10.3)
Chloride: 103 mmol/L (ref 98–111)
Creatinine, Ser: 0.66 mg/dL (ref 0.44–1.00)
GFR, Estimated: >60 mL/min (ref >60)
Glucose, Bld: 125 mg/dL — ABNORMAL HIGH (ref 70–99)
Potassium: 3.2 mmol/L — ABNORMAL LOW (ref 3.5–5.1)
Sodium: 139 mmol/L (ref 135–145)
Total Bilirubin: 0.5 mg/dL (ref 0.0–1.2)
Total Protein: 7.4 g/dL (ref 6.5–8.1)

## 2024-01-02 LAB — HCG, SERUM, QUALITATIVE: Preg, Serum: NEGATIVE

## 2024-01-02 NOTE — ED Triage Notes (Signed)
BIB [facility] from home for NVD and fever with associated fatigue and dehydration, dry mucous membranes, onset yesterday, recent 2 week admission for epilepsy, endorses "was given antibiotic for an infection". Reports some stomach and HA. Alert, NAD, calm, interactive. NSL R AC, 18g, EMS gave zofran and 350cc IVF. No known events, sick contacts, suspicious foods, or travel.

## 2024-01-03 NOTE — ED Notes (Signed)
 Pt left AMA

## 2024-01-04 LAB — LAMOTRIGINE LEVEL: Lamotrigine Lvl: 24.9 ug/mL — ABNORMAL HIGH (ref 2.0–20.0)

## 2024-01-05 LAB — LEVETIRACETAM LEVEL: Levetiracetam Lvl: 95 ug/mL — ABNORMAL HIGH (ref 10.0–40.0)

## 2024-01-15 ENCOUNTER — Other Ambulatory Visit (HOSPITAL_COMMUNITY): Payer: Self-pay

## 2024-01-22 ENCOUNTER — Other Ambulatory Visit (HOSPITAL_COMMUNITY): Payer: Self-pay

## 2024-01-22 ENCOUNTER — Other Ambulatory Visit: Payer: Self-pay | Admitting: Neurology

## 2024-01-22 DIAGNOSIS — G40309 Generalized idiopathic epilepsy and epileptic syndromes, not intractable, without status epilepticus: Secondary | ICD-10-CM

## 2024-01-24 ENCOUNTER — Other Ambulatory Visit: Payer: Self-pay

## 2024-01-28 ENCOUNTER — Other Ambulatory Visit (HOSPITAL_COMMUNITY): Payer: Self-pay

## 2024-02-16 ENCOUNTER — Emergency Department (HOSPITAL_COMMUNITY)

## 2024-02-16 ENCOUNTER — Emergency Department (HOSPITAL_COMMUNITY)
Admission: EM | Admit: 2024-02-16 | Discharge: 2024-02-16 | Disposition: A | Discharge status: Home / Self Care | Attending: Emergency Medicine | Admitting: Emergency Medicine

## 2024-02-16 ENCOUNTER — Other Ambulatory Visit: Payer: Self-pay

## 2024-02-16 DIAGNOSIS — R569 Unspecified convulsions: Secondary | ICD-10-CM | POA: Diagnosis present

## 2024-02-16 DIAGNOSIS — H538 Other visual disturbances: Secondary | ICD-10-CM | POA: Insufficient documentation

## 2024-02-16 DIAGNOSIS — R42 Dizziness and giddiness: Secondary | ICD-10-CM | POA: Diagnosis not present

## 2024-02-16 DIAGNOSIS — R112 Nausea with vomiting, unspecified: Secondary | ICD-10-CM | POA: Insufficient documentation

## 2024-02-16 DIAGNOSIS — R519 Headache, unspecified: Secondary | ICD-10-CM | POA: Insufficient documentation

## 2024-02-16 DIAGNOSIS — E876 Hypokalemia: Secondary | ICD-10-CM | POA: Diagnosis not present

## 2024-02-16 LAB — COMPREHENSIVE METABOLIC PANEL
ALT: 19 U/L (ref 0–44)
AST: 24 U/L (ref 15–41)
Albumin: 4.3 g/dL (ref 3.5–5.0)
Alkaline Phosphatase: 59 U/L (ref 38–126)
Anion gap: 13 (ref 5–15)
BUN: 15 mg/dL (ref 6–20)
CO2: 22 mmol/L (ref 22–32)
Calcium: 9 mg/dL (ref 8.9–10.3)
Chloride: 103 mmol/L (ref 98–111)
Creatinine, Ser: 0.72 mg/dL (ref 0.44–1.00)
GFR, Estimated: >60 mL/min (ref >60)
Glucose, Bld: 164 mg/dL — ABNORMAL HIGH (ref 70–99)
Potassium: 2.9 mmol/L — ABNORMAL LOW (ref 3.5–5.1)
Sodium: 138 mmol/L (ref 135–145)
Total Bilirubin: 0.4 mg/dL (ref 0.0–1.2)
Total Protein: 7 g/dL (ref 6.5–8.1)

## 2024-02-16 LAB — URINALYSIS, ROUTINE W REFLEX MICROSCOPIC
Bilirubin Urine: NEGATIVE
Glucose, UA: NEGATIVE mg/dL
Hgb urine dipstick: NEGATIVE
Ketones, ur: NEGATIVE mg/dL
Leukocytes,Ua: NEGATIVE
Nitrite: NEGATIVE
Protein, ur: NEGATIVE mg/dL
Specific Gravity, Urine: 1.025 (ref 1.005–1.030)
pH: 6 (ref 5.0–8.0)

## 2024-02-16 LAB — CBC
HCT: 38.4 % (ref 36.0–46.0)
Hemoglobin: 12.6 g/dL (ref 12.0–15.0)
MCH: 31.5 pg (ref 26.0–34.0)
MCHC: 32.8 g/dL (ref 30.0–36.0)
MCV: 96 fL (ref 80.0–100.0)
Platelets: 355 10*3/uL (ref 150–400)
RBC: 4 MIL/uL (ref 3.87–5.11)
RDW: 12.7 % (ref 11.5–15.5)
WBC: 10.2 10*3/uL (ref 4.0–10.5)
nRBC: 0 % (ref 0.0–0.2)

## 2024-02-16 LAB — CBG MONITORING, ED: Glucose-Capillary: 110 mg/dL — ABNORMAL HIGH (ref 70–99)

## 2024-02-16 LAB — MAGNESIUM: Magnesium: 2 mg/dL (ref 1.7–2.4)

## 2024-02-16 LAB — ETHANOL: Alcohol, Ethyl (B): <10 mg/dL (ref <10)

## 2024-02-16 LAB — HCG, SERUM, QUALITATIVE: Preg, Serum: NEGATIVE

## 2024-02-16 MED ORDER — MECLIZINE HCL 25 MG PO TABS
25.0000 mg | ORAL_TABLET | Freq: Once | ORAL | Status: AC
  Administered 2024-02-16: 25 mg via ORAL
  Filled 2024-02-16: qty 1

## 2024-02-16 MED ORDER — CARBAMIDE PEROXIDE 6.5 % OT SOLN
5.0000 [drp] | OTIC | Status: DC
  Filled 2024-02-16: qty 15

## 2024-02-16 MED ORDER — DIAZEPAM 5 MG/ML IJ SOLN
5.0000 mg | Freq: Once | INTRAMUSCULAR | Status: AC
  Administered 2024-02-16: 5 mg via INTRAVENOUS
  Filled 2024-02-16: qty 2

## 2024-02-16 MED ORDER — POTASSIUM CHLORIDE CRYS ER 20 MEQ PO TBCR
40.0000 meq | EXTENDED_RELEASE_TABLET | Freq: Once | ORAL | Status: AC
  Administered 2024-02-16: 40 meq via ORAL
  Filled 2024-02-16: qty 2

## 2024-02-16 MED ORDER — METOCLOPRAMIDE HCL 5 MG/ML IJ SOLN
10.0000 mg | INTRAMUSCULAR | Status: AC
  Administered 2024-02-16: 10 mg via INTRAVENOUS
  Filled 2024-02-16: qty 2

## 2024-02-16 NOTE — ED Provider Notes (Signed)
 [place] EMERGENCY DEPARTMENT AT [facility] Provider Note   CSN: [id number] Arrival date & time: 02/16/24  1233     History {Add pertinent medical, surgical, social history, OB history to HPI:1} Chief Complaint  Patient presents with   Seizures   Dizziness   Nausea   Headache    Melinda Calderon is a 23 y.o. female.  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  Patient reports that this morning she woke up at 7 AM and had a room spinning sensation.  Says that she has had some nausea and vomiting since then.  Has been compliant with her medications but reportedly had a seizure today.  Does not recall what happened.  Unable to reach her mother for additional history.  Was discharged on Keppra 1500 mg twice daily, lamotrigine 200 mg twice daily, Fycompa 4 mg nightly.       Home Medications Prior to Admission medications   Medication Sig Start Date End Date Taking? Authorizing Provider  FYCOMPA 4 MG TABS Take 1 tablet at bedtime 12/22/23   Dorcas Carrow, MD  lamoTRIgine (LAMICTAL) 200 MG tablet Take 1 tablet (200 mg total) by mouth 2 (two) times daily. 12/22/23   Dorcas Carrow, MD  levETIRAcetam (KEPPRA) 750 MG tablet Take 2 tablets (1,500 mg total) by mouth 2 (two) times daily. 12/22/23   Dorcas Carrow, MD  ondansetron (ZOFRAN ODT) 4 MG disintegrating tablet Take 1 tablet (4 mg total) by mouth every 8 (eight) hours as needed for nausea or vomiting. 10/29/23   Danford, Earl Lites, MD  dicyclomine (BENTYL) 20 MG tablet Take 1 tablet (20 mg total) by mouth 2 (two) times daily. 06/28/20 12/31/20  Joy, Shawn C, PA-C  famotidine (PEPCID) 20 MG tablet Take 1 tablet (20 mg total) by mouth 2 (two) times daily. 10/07/20 12/31/20  Gerhard Munch, MD      Allergies    Patient has no known allergies.    Review of Systems    Review of Systems  Physical Exam Updated Vital Signs BP 120/72   Pulse 93   Temp (!) 97.3 F (36.3 C) (Oral)   Resp 13   Ht 5' (1.524 m)   Wt 61.2 kg   SpO2 100%   BMI 26.37 kg/m   Physical Exam Vitals and nursing note reviewed.  Constitutional:      General: She is not in acute distress.    Appearance: She is well-developed.     Comments: Uncomfortable appearing holding emesis basin  HENT:     Head: Normocephalic and atraumatic.     Right Ear: External ear normal.     Left Ear: External ear normal.     Ears:     Comments: Unable to visualize TMs due to impacted cerumen    Nose: Nose normal.     Mouth/Throat:     Comments: Tongue bite present Eyes:     Extraocular Movements: Extraocular movements intact.     Conjunctiva/sclera: Conjunctivae normal.     Pupils: Pupils are equal, round, and reactive to light.  Cardiovascular:     Rate and Rhythm: Normal rate and regular rhythm.     Heart sounds: No murmur heard. Pulmonary:     Effort: Pulmonary effort is normal. No respiratory distress.     Breath sounds: Normal breath sounds.  Musculoskeletal:     Cervical back: Normal range of  motion and neck supple.     Right lower leg: No edema.     Left lower leg: No edema.  Skin:    General: Skin is warm and dry.  Neurological:     Mental Status: She is alert and oriented to person, place, and time. Mental status is at baseline.     Cranial Nerves: No cranial nerve deficit.     Sensory: No sensory deficit.     Motor: No weakness.     Coordination: Coordination normal.  Psychiatric:        Mood and Affect: Mood normal.     ED Results / Procedures / Treatments   Labs (all labs ordered are listed, but only abnormal results are displayed) Labs Reviewed  CBG MONITORING, ED - Abnormal; Notable for the following components:      Result Value   Glucose-Capillary 110 (*)    All other components within normal limits  CBC  HCG, SERUM, QUALITATIVE  COMPREHENSIVE METABOLIC  PANEL    EKG None  Radiology No results found.  Procedures Procedures  {Document cardiac monitor, telemetry assessment procedure when appropriate:1}  Medications Ordered in ED Medications  metoCLOPramide (REGLAN) injection 10 mg (has no administration in time range)  diazepam (VALIUM) injection 5 mg (has no administration in time range)  carbamide peroxide (DEBROX) 6.5 % OTIC (EAR) solution 5 drop (has no administration in time range)    ED Course/ Medical Decision Making/ A&P Clinical Course as of 02/16/24 1520  Tue Feb 16, 2024  1518 Dw Dr Otelia Limes consulted. Difficult to tell if this was  non-epileptic event or a true seizure. Has had episodes while on EEG that were not seizures.  [RP]    Clinical Course User Index [RP] Rondel Baton, MD   {   Click here for ABCD2, HEART and other calculatorsREFRESH Note before signing :1}                              Medical Decision Making Amount and/or Complexity of Data Reviewed Labs: ordered. Radiology: ordered.  Risk OTC drugs. Prescription drug management.   ***  {Document critical care time when appropriate:1} {Document review of labs and clinical decision tools ie heart score, Chads2Vasc2 etc:1}  {Document your independent review of radiology images, and any outside records:1} {Document your discussion with family members, caretakers, and with consultants:1} {Document social determinants of health affecting pt's care:1} {Document your decision making why or why not admission, treatments were needed:1} Final Clinical Impression(s) / ED Diagnoses Final diagnoses:  None    Rx / DC Orders ED Discharge Orders     None

## 2024-02-16 NOTE — ED Triage Notes (Addendum)
 Pt BIBA from home. Had seizure at 1130, and developed severe HA, blurred vision, chest pain, and vertigo w/nausea immediately after. Pt has seizures mult times a week.  No post-ictal period witnessed by EMS.   Given 4mg  IV zofran  AOx4

## 2024-02-16 NOTE — ED Provider Notes (Signed)
  Physical Exam  BP (!) 97/50   Pulse 90   Temp (!) 97.3 F (36.3 C) (Oral)   Resp 17   Ht 5' (1.524 m)   Wt 61.2 kg   SpO2 99%   BMI 26.37 kg/m   Physical Exam Vitals and nursing note reviewed.  HENT:     Head: Normocephalic and atraumatic.  Eyes:     Pupils: Pupils are equal, round, and reactive to light.  Cardiovascular:     Rate and Rhythm: Normal rate and regular rhythm.  Pulmonary:     Effort: Pulmonary effort is normal.     Breath sounds: Normal breath sounds.  Abdominal:     Palpations: Abdomen is soft.     Tenderness: There is no abdominal tenderness.  Skin:    General: Skin is warm and dry.  Neurological:     Mental Status: She is alert and oriented to person, place, and time.     Sensory: No sensory deficit.     Motor: No weakness.     Gait: Gait normal.  Psychiatric:        Mood and Affect: Mood normal.     Procedures  Procedures  ED Course / MDM   Clinical Course as of 02/16/24 1726  Tue Feb 16, 2024  1518 Dw Dr Otelia Limes consulted. Difficult to tell if this was  non-epileptic event or a true seizure. Has had episodes while on EEG that were not seizures.  [RP]  1726 UA shows no evidence of UTI.  Magnesium within normal limits.  Patient remains neurologically intact with no deficits.  Reports feeling well.  She is able to stand and ambulate with steady gait.  Stable for discharge with instruction for PCP and neurology follow-up [MP]    Clinical Course User Index [MP] Royanne Foots, DO [RP] Rondel Baton, MD   Medical Decision Making I, Estelle June DO, have assumed care of this patient from the previous provider pending magnesium level, UA, ambulatory trial, reevaluation and disposition  Amount and/or Complexity of Data Reviewed Labs: ordered. Radiology: ordered.  Risk Prescription drug management.          Royanne Foots, DO 02/16/24 1727

## 2024-02-16 NOTE — Discharge Instructions (Signed)
 You were seen for your seizure in the emergency department.   At home, please continue to take the seizure medication that you have been prescribed.  Refrain from activities that could be dangerous when you have a seizure such as climbing a ladder or swimming.  Do not drive for at least 6 months.  Talk to your neurologist to see when it is safe to resume these activities.    Check your MyChart online for the results of any tests that had not resulted by the time you left the emergency department.   Follow-up with your primary doctor in 2-3 days regarding your visit.  Follow-up with your neurologist in 1 week.  Return immediately to the emergency department if you experience any of the following: Seizures lasting more than 5 minutes, or any other concerning symptoms.    Thank you for visiting our Emergency Department. It was a pleasure taking care of you today.

## 2024-02-17 LAB — LEVETIRACETAM LEVEL: Levetiracetam Lvl: 50.3 ug/mL — ABNORMAL HIGH (ref 10.0–40.0)

## 2024-02-17 LAB — LAMOTRIGINE LEVEL: Lamotrigine Lvl: 26.8 ug/mL — ABNORMAL HIGH (ref 2.0–20.0)

## 2024-04-18 ENCOUNTER — Other Ambulatory Visit (HOSPITAL_COMMUNITY): Payer: Self-pay
